# Patient Record
Sex: Female | Born: 1935 | Race: White | Hispanic: No | Marital: Married | Smoking: Former smoker
Health system: Southern US, Community
[De-identification: ages and names within clinical notes are randomized; demographics above are authoritative.]

## PROBLEM LIST (undated history)

## (undated) DIAGNOSIS — M549 Dorsalgia, unspecified: Secondary | ICD-10-CM

## (undated) DIAGNOSIS — L409 Psoriasis, unspecified: Secondary | ICD-10-CM

## (undated) DIAGNOSIS — K589 Irritable bowel syndrome without diarrhea: Secondary | ICD-10-CM

## (undated) DIAGNOSIS — M1990 Unspecified osteoarthritis, unspecified site: Secondary | ICD-10-CM

## (undated) DIAGNOSIS — K219 Gastro-esophageal reflux disease without esophagitis: Secondary | ICD-10-CM

## (undated) DIAGNOSIS — F419 Anxiety disorder, unspecified: Secondary | ICD-10-CM

## (undated) DIAGNOSIS — T07XXXA Unspecified multiple injuries, initial encounter: Secondary | ICD-10-CM

## (undated) DIAGNOSIS — K8020 Calculus of gallbladder without cholecystitis without obstruction: Secondary | ICD-10-CM

## (undated) DIAGNOSIS — C50919 Malignant neoplasm of unspecified site of unspecified female breast: Secondary | ICD-10-CM

## (undated) DIAGNOSIS — F1020 Alcohol dependence, uncomplicated: Secondary | ICD-10-CM

## (undated) DIAGNOSIS — G8929 Other chronic pain: Secondary | ICD-10-CM

## (undated) DIAGNOSIS — C3490 Malignant neoplasm of unspecified part of unspecified bronchus or lung: Secondary | ICD-10-CM

## (undated) DIAGNOSIS — D649 Anemia, unspecified: Secondary | ICD-10-CM

## (undated) DIAGNOSIS — J449 Chronic obstructive pulmonary disease, unspecified: Secondary | ICD-10-CM

## (undated) DIAGNOSIS — E43 Unspecified severe protein-calorie malnutrition: Secondary | ICD-10-CM

## (undated) DIAGNOSIS — G629 Polyneuropathy, unspecified: Secondary | ICD-10-CM

## (undated) HISTORY — DX: Gastro-esophageal reflux disease without esophagitis: K21.9

## (undated) HISTORY — DX: Polyneuropathy, unspecified: G62.9

## (undated) HISTORY — DX: Anxiety disorder, unspecified: F41.9

## (undated) HISTORY — PX: OTHER SURGICAL HISTORY: SHX169

## (undated) HISTORY — DX: Irritable bowel syndrome without diarrhea: K58.9

## (undated) HISTORY — PX: LUMBAR LAMINECTOMY: SHX95

## (undated) HISTORY — DX: Unspecified osteoarthritis, unspecified site: M19.90

## (undated) HISTORY — DX: Chronic obstructive pulmonary disease, unspecified: J44.9

## (undated) HISTORY — DX: Psoriasis, unspecified: L40.9

## (undated) HISTORY — DX: Malignant neoplasm of unspecified part of unspecified bronchus or lung: C34.90

## (undated) HISTORY — DX: Anemia, unspecified: D64.9

## (undated) HISTORY — DX: Calculus of gallbladder without cholecystitis without obstruction: K80.20

## (undated) HISTORY — DX: Malignant neoplasm of unspecified site of unspecified female breast: C50.919

## (undated) HISTORY — DX: Alcohol dependence, uncomplicated: F10.20

---

## 1985-12-08 HISTORY — PX: OTHER SURGICAL HISTORY: SHX169

## 1998-08-21 ENCOUNTER — Ambulatory Visit (HOSPITAL_COMMUNITY): Admission: RE | Admit: 1998-08-21 | Discharge: 1998-08-21 | Payer: Self-pay | Admitting: Pulmonary Disease

## 1998-08-28 ENCOUNTER — Ambulatory Visit (HOSPITAL_COMMUNITY): Admission: RE | Admit: 1998-08-28 | Discharge: 1998-08-28 | Payer: Self-pay | Admitting: Pulmonary Disease

## 1998-09-07 HISTORY — PX: OTHER SURGICAL HISTORY: SHX169

## 1998-09-20 ENCOUNTER — Inpatient Hospital Stay: Admission: RE | Admit: 1998-09-20 | Discharge: 1998-10-01 | Payer: Self-pay | Admitting: Thoracic Surgery

## 1998-09-20 ENCOUNTER — Encounter: Payer: Self-pay | Admitting: Thoracic Surgery

## 1998-09-21 ENCOUNTER — Encounter: Payer: Self-pay | Admitting: Thoracic Surgery

## 1998-09-24 ENCOUNTER — Encounter: Payer: Self-pay | Admitting: Thoracic Surgery

## 1998-09-25 ENCOUNTER — Encounter: Payer: Self-pay | Admitting: Thoracic Surgery

## 1998-09-26 ENCOUNTER — Encounter: Payer: Self-pay | Admitting: Thoracic Surgery

## 1998-09-27 ENCOUNTER — Encounter: Payer: Self-pay | Admitting: Thoracic Surgery

## 1998-09-28 ENCOUNTER — Encounter: Payer: Self-pay | Admitting: Thoracic Surgery

## 1998-09-30 ENCOUNTER — Encounter: Payer: Self-pay | Admitting: Thoracic Surgery

## 1998-10-14 ENCOUNTER — Inpatient Hospital Stay (HOSPITAL_COMMUNITY): Admission: EM | Admit: 1998-10-14 | Discharge: 1998-10-20 | Payer: Self-pay | Admitting: Emergency Medicine

## 1998-10-14 ENCOUNTER — Encounter: Payer: Self-pay | Admitting: Thoracic Surgery

## 1998-10-15 ENCOUNTER — Encounter: Payer: Self-pay | Admitting: Thoracic Surgery

## 1999-07-07 ENCOUNTER — Emergency Department (HOSPITAL_COMMUNITY): Admission: EM | Admit: 1999-07-07 | Discharge: 1999-07-07 | Payer: Self-pay | Admitting: Emergency Medicine

## 1999-10-30 ENCOUNTER — Encounter: Payer: Self-pay | Admitting: Thoracic Surgery

## 1999-10-30 ENCOUNTER — Encounter: Admission: RE | Admit: 1999-10-30 | Discharge: 1999-10-30 | Payer: Self-pay | Admitting: Thoracic Surgery

## 2000-04-27 ENCOUNTER — Encounter: Admission: RE | Admit: 2000-04-27 | Discharge: 2000-04-27 | Payer: Self-pay | Admitting: Thoracic Surgery

## 2000-04-27 ENCOUNTER — Encounter: Payer: Self-pay | Admitting: Thoracic Surgery

## 2000-11-04 ENCOUNTER — Encounter: Admission: RE | Admit: 2000-11-04 | Discharge: 2000-11-04 | Payer: Self-pay | Admitting: Thoracic Surgery

## 2000-11-04 ENCOUNTER — Encounter: Payer: Self-pay | Admitting: Thoracic Surgery

## 2001-05-04 ENCOUNTER — Encounter: Payer: Self-pay | Admitting: Thoracic Surgery

## 2001-05-04 ENCOUNTER — Encounter: Admission: RE | Admit: 2001-05-04 | Discharge: 2001-05-04 | Payer: Self-pay | Admitting: Thoracic Surgery

## 2001-11-03 ENCOUNTER — Encounter: Admission: RE | Admit: 2001-11-03 | Discharge: 2001-11-03 | Payer: Self-pay | Admitting: Thoracic Surgery

## 2001-11-03 ENCOUNTER — Encounter: Payer: Self-pay | Admitting: Thoracic Surgery

## 2002-05-17 ENCOUNTER — Encounter: Payer: Self-pay | Admitting: Thoracic Surgery

## 2002-05-17 ENCOUNTER — Encounter: Admission: RE | Admit: 2002-05-17 | Discharge: 2002-05-17 | Payer: Self-pay | Admitting: Thoracic Surgery

## 2002-10-18 ENCOUNTER — Other Ambulatory Visit: Admission: RE | Admit: 2002-10-18 | Discharge: 2002-10-18 | Payer: Self-pay | Admitting: Radiology

## 2002-11-15 ENCOUNTER — Encounter: Admission: RE | Admit: 2002-11-15 | Discharge: 2002-11-15 | Payer: Self-pay | Admitting: Surgery

## 2002-11-15 ENCOUNTER — Encounter: Payer: Self-pay | Admitting: Surgery

## 2002-11-16 ENCOUNTER — Encounter (INDEPENDENT_AMBULATORY_CARE_PROVIDER_SITE_OTHER): Payer: Self-pay | Admitting: *Deleted

## 2002-11-16 ENCOUNTER — Encounter: Payer: Self-pay | Admitting: Surgery

## 2002-11-16 ENCOUNTER — Ambulatory Visit (HOSPITAL_BASED_OUTPATIENT_CLINIC_OR_DEPARTMENT_OTHER): Admission: RE | Admit: 2002-11-16 | Discharge: 2002-11-16 | Payer: Self-pay | Admitting: Surgery

## 2002-11-23 ENCOUNTER — Ambulatory Visit: Admission: RE | Admit: 2002-11-23 | Discharge: 2003-02-21 | Payer: Self-pay | Admitting: Radiation Oncology

## 2002-12-23 ENCOUNTER — Encounter: Payer: Self-pay | Admitting: Thoracic Surgery

## 2002-12-23 ENCOUNTER — Encounter: Admission: RE | Admit: 2002-12-23 | Discharge: 2002-12-23 | Payer: Self-pay | Admitting: Thoracic Surgery

## 2003-06-27 ENCOUNTER — Encounter: Payer: Self-pay | Admitting: Thoracic Surgery

## 2003-06-27 ENCOUNTER — Encounter: Admission: RE | Admit: 2003-06-27 | Discharge: 2003-06-27 | Payer: Self-pay | Admitting: Thoracic Surgery

## 2003-11-10 ENCOUNTER — Other Ambulatory Visit: Admission: RE | Admit: 2003-11-10 | Discharge: 2003-11-10 | Payer: Self-pay | Admitting: Internal Medicine

## 2004-01-16 ENCOUNTER — Encounter: Admission: RE | Admit: 2004-01-16 | Discharge: 2004-01-16 | Payer: Self-pay | Admitting: Thoracic Surgery

## 2004-04-07 HISTORY — PX: OTHER SURGICAL HISTORY: SHX169

## 2004-05-06 ENCOUNTER — Inpatient Hospital Stay (HOSPITAL_COMMUNITY): Admission: EM | Admit: 2004-05-06 | Discharge: 2004-05-10 | Payer: Self-pay | Admitting: *Deleted

## 2004-05-10 ENCOUNTER — Inpatient Hospital Stay (HOSPITAL_COMMUNITY)
Admission: RE | Admit: 2004-05-10 | Discharge: 2004-05-28 | Payer: Self-pay | Admitting: Physical Medicine & Rehabilitation

## 2004-05-15 ENCOUNTER — Ambulatory Visit (HOSPITAL_COMMUNITY): Admission: RE | Admit: 2004-05-15 | Discharge: 2004-05-15 | Payer: Self-pay | Admitting: Thoracic Surgery

## 2004-05-20 ENCOUNTER — Encounter (INDEPENDENT_AMBULATORY_CARE_PROVIDER_SITE_OTHER): Payer: Self-pay | Admitting: Specialist

## 2004-06-19 ENCOUNTER — Encounter: Admission: RE | Admit: 2004-06-19 | Discharge: 2004-06-19 | Payer: Self-pay | Admitting: Thoracic Surgery

## 2004-07-31 ENCOUNTER — Encounter: Admission: RE | Admit: 2004-07-31 | Discharge: 2004-07-31 | Payer: Self-pay | Admitting: Thoracic Surgery

## 2004-12-11 ENCOUNTER — Encounter: Admission: RE | Admit: 2004-12-11 | Discharge: 2004-12-11 | Payer: Self-pay | Admitting: Thoracic Surgery

## 2005-01-07 ENCOUNTER — Ambulatory Visit: Payer: Self-pay | Admitting: Oncology

## 2005-02-11 ENCOUNTER — Ambulatory Visit: Payer: Self-pay | Admitting: Pulmonary Disease

## 2005-04-08 ENCOUNTER — Ambulatory Visit: Payer: Self-pay | Admitting: Internal Medicine

## 2005-05-21 ENCOUNTER — Encounter: Admission: RE | Admit: 2005-05-21 | Discharge: 2005-05-21 | Payer: Self-pay | Admitting: Thoracic Surgery

## 2005-07-01 ENCOUNTER — Ambulatory Visit: Payer: Self-pay | Admitting: Pulmonary Disease

## 2005-07-10 ENCOUNTER — Ambulatory Visit: Payer: Self-pay | Admitting: Pulmonary Disease

## 2005-09-19 ENCOUNTER — Ambulatory Visit: Payer: Self-pay | Admitting: Pulmonary Disease

## 2005-11-25 ENCOUNTER — Encounter: Admission: RE | Admit: 2005-11-25 | Discharge: 2005-11-25 | Payer: Self-pay | Admitting: Thoracic Surgery

## 2005-12-09 ENCOUNTER — Ambulatory Visit: Payer: Self-pay | Admitting: Adult Health

## 2006-01-13 ENCOUNTER — Ambulatory Visit: Payer: Self-pay | Admitting: Pulmonary Disease

## 2006-01-16 ENCOUNTER — Ambulatory Visit: Payer: Self-pay | Admitting: Oncology

## 2006-04-08 ENCOUNTER — Encounter: Payer: Self-pay | Admitting: Pulmonary Disease

## 2006-07-14 ENCOUNTER — Ambulatory Visit: Payer: Self-pay | Admitting: Pulmonary Disease

## 2006-09-02 ENCOUNTER — Encounter: Admission: RE | Admit: 2006-09-02 | Discharge: 2006-09-02 | Payer: Self-pay | Admitting: Thoracic Surgery

## 2006-09-12 ENCOUNTER — Emergency Department (HOSPITAL_COMMUNITY): Admission: EM | Admit: 2006-09-12 | Discharge: 2006-09-12 | Payer: Self-pay | Admitting: Emergency Medicine

## 2006-09-22 ENCOUNTER — Ambulatory Visit: Payer: Self-pay | Admitting: Pulmonary Disease

## 2007-01-11 ENCOUNTER — Ambulatory Visit: Payer: Self-pay | Admitting: Pulmonary Disease

## 2007-01-11 LAB — CONVERTED CEMR LAB
ALT: 11 units/L (ref 0–40)
Alkaline Phosphatase: 63 units/L (ref 39–117)
Basophils Absolute: 0.1 10*3/uL (ref 0.0–0.1)
Basophils Relative: 1 % (ref 0.0–1.0)
Calcium: 8.6 mg/dL (ref 8.4–10.5)
Chloride: 108 meq/L (ref 96–112)
Creatinine, Ser: 1.1 mg/dL (ref 0.4–1.2)
Eosinophils Absolute: 0.1 10*3/uL (ref 0.0–0.6)
Eosinophils Relative: 1.8 % (ref 0.0–5.0)
GFR calc non Af Amer: 51 mL/min
Lymphocytes Relative: 44.8 % (ref 12.0–46.0)
MCV: 91.6 fL (ref 78.0–100.0)
Platelets: 292 10*3/uL (ref 150–400)
Potassium: 4.4 meq/L (ref 3.5–5.1)
RBC: 3.82 M/uL — ABNORMAL LOW (ref 3.87–5.11)
TSH: 2.51 microintl units/mL (ref 0.35–5.50)
Total Bilirubin: 0.2 mg/dL — ABNORMAL LOW (ref 0.3–1.2)

## 2007-01-13 ENCOUNTER — Ambulatory Visit: Payer: Self-pay | Admitting: Oncology

## 2007-07-12 ENCOUNTER — Ambulatory Visit: Payer: Self-pay | Admitting: Pulmonary Disease

## 2007-08-02 ENCOUNTER — Ambulatory Visit: Payer: Self-pay | Admitting: Internal Medicine

## 2007-08-02 ENCOUNTER — Encounter: Payer: Self-pay | Admitting: Pulmonary Disease

## 2007-09-23 DIAGNOSIS — C3490 Malignant neoplasm of unspecified part of unspecified bronchus or lung: Secondary | ICD-10-CM | POA: Insufficient documentation

## 2007-09-23 DIAGNOSIS — K589 Irritable bowel syndrome without diarrhea: Secondary | ICD-10-CM

## 2007-09-23 DIAGNOSIS — K219 Gastro-esophageal reflux disease without esophagitis: Secondary | ICD-10-CM

## 2007-09-23 DIAGNOSIS — L408 Other psoriasis: Secondary | ICD-10-CM | POA: Insufficient documentation

## 2007-09-23 DIAGNOSIS — D649 Anemia, unspecified: Secondary | ICD-10-CM | POA: Insufficient documentation

## 2007-09-23 DIAGNOSIS — F411 Generalized anxiety disorder: Secondary | ICD-10-CM | POA: Insufficient documentation

## 2007-10-05 ENCOUNTER — Ambulatory Visit: Payer: Self-pay | Admitting: Pulmonary Disease

## 2007-10-26 ENCOUNTER — Encounter (INDEPENDENT_AMBULATORY_CARE_PROVIDER_SITE_OTHER): Payer: Self-pay

## 2007-11-16 ENCOUNTER — Ambulatory Visit: Payer: Self-pay | Admitting: Oncology

## 2007-11-18 LAB — CBC WITH DIFFERENTIAL/PLATELET
BASO%: 1.7 % (ref 0.0–2.0)
HCT: 37.4 % (ref 34.8–46.6)
HGB: 12.5 g/dL (ref 11.6–15.9)
MCHC: 33.4 g/dL (ref 32.0–36.0)
MONO#: 0.5 10*3/uL (ref 0.1–0.9)
NEUT%: 36.2 % — ABNORMAL LOW (ref 39.6–76.8)
RDW: 14.6 % — ABNORMAL HIGH (ref 11.3–14.5)
WBC: 9.2 10*3/uL (ref 3.9–10.0)
lymph#: 5.1 10*3/uL — ABNORMAL HIGH (ref 0.9–3.3)

## 2007-11-18 LAB — COMPREHENSIVE METABOLIC PANEL
ALT: 10 U/L (ref 0–35)
Albumin: 3.8 g/dL (ref 3.5–5.2)
CO2: 26 mEq/L (ref 19–32)
Calcium: 9.3 mg/dL (ref 8.4–10.5)
Chloride: 103 mEq/L (ref 96–112)
Creatinine, Ser: 1.06 mg/dL (ref 0.40–1.20)
Potassium: 4.3 mEq/L (ref 3.5–5.3)
Total Protein: 6.3 g/dL (ref 6.0–8.3)

## 2007-11-18 LAB — CANCER ANTIGEN 27.29: CA 27.29: 45 U/mL — ABNORMAL HIGH (ref 0–39)

## 2007-11-25 ENCOUNTER — Encounter: Payer: Self-pay | Admitting: Pulmonary Disease

## 2008-02-09 ENCOUNTER — Ambulatory Visit: Payer: Self-pay | Admitting: Pulmonary Disease

## 2008-02-09 DIAGNOSIS — J449 Chronic obstructive pulmonary disease, unspecified: Secondary | ICD-10-CM

## 2008-02-09 DIAGNOSIS — M1990 Unspecified osteoarthritis, unspecified site: Secondary | ICD-10-CM | POA: Insufficient documentation

## 2008-02-09 DIAGNOSIS — C50919 Malignant neoplasm of unspecified site of unspecified female breast: Secondary | ICD-10-CM | POA: Insufficient documentation

## 2008-02-09 DIAGNOSIS — J4489 Other specified chronic obstructive pulmonary disease: Secondary | ICD-10-CM | POA: Insufficient documentation

## 2008-02-09 DIAGNOSIS — K8020 Calculus of gallbladder without cholecystitis without obstruction: Secondary | ICD-10-CM | POA: Insufficient documentation

## 2008-02-09 DIAGNOSIS — G589 Mononeuropathy, unspecified: Secondary | ICD-10-CM | POA: Insufficient documentation

## 2008-02-09 DIAGNOSIS — M810 Age-related osteoporosis without current pathological fracture: Secondary | ICD-10-CM | POA: Insufficient documentation

## 2008-02-09 DIAGNOSIS — F1020 Alcohol dependence, uncomplicated: Secondary | ICD-10-CM | POA: Insufficient documentation

## 2008-02-13 LAB — CONVERTED CEMR LAB
AST: 29 units/L (ref 0–37)
Alkaline Phosphatase: 84 units/L (ref 39–117)
Bilirubin, Direct: 0.1 mg/dL (ref 0.0–0.3)
Chloride: 103 meq/L (ref 96–112)
Eosinophils Absolute: 0.1 10*3/uL (ref 0.0–0.6)
GFR calc Af Amer: 78 mL/min
GFR calc non Af Amer: 65 mL/min
HCT: 36.4 % (ref 36.0–46.0)
Lymphocytes Relative: 26.8 % (ref 12.0–46.0)
Monocytes Relative: 2.6 % — ABNORMAL LOW (ref 3.0–11.0)
Neutrophils Relative %: 69.6 % (ref 43.0–77.0)
RBC: 3.98 M/uL (ref 3.87–5.11)
Saturation Ratios: 19.4 % — ABNORMAL LOW (ref 20.0–50.0)
TSH: 2.56 microintl units/mL (ref 0.35–5.50)
Total Bilirubin: 0.5 mg/dL (ref 0.3–1.2)
Vit D, 1,25-Dihydroxy: 30 (ref 30–89)
WBC: 8 10*3/uL (ref 4.5–10.5)

## 2008-04-07 ENCOUNTER — Telehealth: Payer: Self-pay | Admitting: Pulmonary Disease

## 2008-04-13 ENCOUNTER — Encounter: Payer: Self-pay | Admitting: Pulmonary Disease

## 2008-04-26 ENCOUNTER — Encounter: Payer: Self-pay | Admitting: Pulmonary Disease

## 2008-08-11 ENCOUNTER — Ambulatory Visit: Payer: Self-pay | Admitting: Pulmonary Disease

## 2008-08-14 LAB — CONVERTED CEMR LAB
Basophils Absolute: 0.1 10*3/uL (ref 0.0–0.1)
Basophils Relative: 0.7 % (ref 0.0–3.0)
CO2: 33 meq/L — ABNORMAL HIGH (ref 19–32)
Chloride: 102 meq/L (ref 96–112)
Creatinine, Ser: 0.8 mg/dL (ref 0.4–1.2)
Eosinophils Relative: 1.2 % (ref 0.0–5.0)
GFR calc Af Amer: 90 mL/min
Glucose, Bld: 97 mg/dL (ref 70–99)
HCT: 38.3 % (ref 36.0–46.0)
Hemoglobin: 13.3 g/dL (ref 12.0–15.0)
MCHC: 34.6 g/dL (ref 30.0–36.0)
MCV: 91.9 fL (ref 78.0–100.0)
Monocytes Absolute: 0.7 10*3/uL (ref 0.1–1.0)
Neutrophils Relative %: 57.2 % (ref 43.0–77.0)
Platelets: 397 10*3/uL (ref 150–400)
RBC: 4.16 M/uL (ref 3.87–5.11)
Saturation Ratios: 24.2 % (ref 20.0–50.0)

## 2008-08-18 ENCOUNTER — Encounter: Payer: Self-pay | Admitting: Pulmonary Disease

## 2008-08-23 LAB — CONVERTED CEMR LAB: Vit D, 1,25-Dihydroxy: 66 (ref 30–89)

## 2008-09-06 ENCOUNTER — Ambulatory Visit: Payer: Self-pay | Admitting: Pulmonary Disease

## 2009-02-16 ENCOUNTER — Ambulatory Visit: Payer: Self-pay | Admitting: Pulmonary Disease

## 2009-03-12 ENCOUNTER — Ambulatory Visit: Payer: Self-pay | Admitting: Pulmonary Disease

## 2009-04-06 ENCOUNTER — Encounter: Payer: Self-pay | Admitting: Pulmonary Disease

## 2009-04-23 ENCOUNTER — Encounter: Payer: Self-pay | Admitting: Pulmonary Disease

## 2009-07-30 ENCOUNTER — Telehealth: Payer: Self-pay | Admitting: Pulmonary Disease

## 2009-08-22 ENCOUNTER — Ambulatory Visit: Payer: Self-pay | Admitting: Pulmonary Disease

## 2009-08-24 LAB — CONVERTED CEMR LAB
Basophils Absolute: 0 10*3/uL (ref 0.0–0.1)
Bilirubin, Direct: 0 mg/dL (ref 0.0–0.3)
CO2: 32 meq/L (ref 19–32)
Creatinine, Ser: 1 mg/dL (ref 0.4–1.2)
GFR calc non Af Amer: 57.04 mL/min (ref >60)
Lymphs Abs: 2.9 10*3/uL (ref 0.7–4.0)
MCV: 92.4 fL (ref 78.0–100.0)
Monocytes Absolute: 0.8 10*3/uL (ref 0.1–1.0)
Neutro Abs: 6 10*3/uL (ref 1.4–7.7)
Neutrophils Relative %: 60.7 % (ref 43.0–77.0)
Potassium: 4.5 meq/L (ref 3.5–5.1)
RBC: 4.25 M/uL (ref 3.87–5.11)
Saturation Ratios: 32.6 % (ref 20.0–50.0)
Sodium: 141 meq/L (ref 135–145)
Total Bilirubin: 0.5 mg/dL (ref 0.3–1.2)
WBC: 9.9 10*3/uL (ref 4.5–10.5)

## 2009-08-27 ENCOUNTER — Telehealth: Payer: Self-pay | Admitting: Pulmonary Disease

## 2009-09-07 ENCOUNTER — Ambulatory Visit: Payer: Self-pay | Admitting: Pulmonary Disease

## 2009-09-07 ENCOUNTER — Ambulatory Visit: Payer: Self-pay | Admitting: Family Medicine

## 2009-11-20 ENCOUNTER — Telehealth (INDEPENDENT_AMBULATORY_CARE_PROVIDER_SITE_OTHER): Payer: Self-pay | Admitting: *Deleted

## 2010-02-20 ENCOUNTER — Ambulatory Visit: Payer: Self-pay | Admitting: Pulmonary Disease

## 2010-02-20 DIAGNOSIS — H9190 Unspecified hearing loss, unspecified ear: Secondary | ICD-10-CM | POA: Insufficient documentation

## 2010-07-12 ENCOUNTER — Ambulatory Visit: Payer: Self-pay | Admitting: Pulmonary Disease

## 2010-07-12 DIAGNOSIS — R634 Abnormal weight loss: Secondary | ICD-10-CM

## 2010-07-15 LAB — CONVERTED CEMR LAB
AST: 24 units/L (ref 0–37)
Alkaline Phosphatase: 87 units/L (ref 39–117)
Bilirubin, Direct: 0.1 mg/dL (ref 0.0–0.3)
Calcium: 9.5 mg/dL (ref 8.4–10.5)
Creatinine, Ser: 0.7 mg/dL (ref 0.4–1.2)
Eosinophils Relative: 1.3 % (ref 0.0–5.0)
Folate: 19.9 ng/mL
Glucose, Bld: 79 mg/dL (ref 70–99)
Iron: 94 ug/dL (ref 42–145)
Lymphocytes Relative: 33.2 % (ref 12.0–46.0)
MCHC: 33 g/dL (ref 30.0–36.0)
MCV: 91.8 fL (ref 78.0–100.0)
Neutro Abs: 5.9 10*3/uL (ref 1.4–7.7)
Neutrophils Relative %: 58.9 % (ref 43.0–77.0)
Platelets: 283 10*3/uL (ref 150.0–400.0)
Potassium: 4.4 meq/L (ref 3.5–5.1)
RBC: 4.25 M/uL (ref 3.87–5.11)
RDW: 16.3 % — ABNORMAL HIGH (ref 11.5–14.6)
TSH: 2.71 microintl units/mL (ref 0.35–5.50)
WBC: 10 10*3/uL (ref 4.5–10.5)

## 2010-08-23 ENCOUNTER — Ambulatory Visit: Payer: Self-pay | Admitting: Pulmonary Disease

## 2010-12-29 ENCOUNTER — Encounter: Payer: Self-pay | Admitting: Thoracic Surgery

## 2011-01-07 NOTE — Assessment & Plan Note (Signed)
Summary: losing weight/ no appetite/ mbw   CC:  c/o losing weight and no appetite sob occasionally with exertion.  History of Present Illness: 77 y/o WF w/ known hx of COPD, prev. lung cancer s/p LLLobectomy for squamous cell ca 10/99 by DrBurney..., breast cancer s/p left mastectomy. (finished tamoxifen x 5 yr-12/08)   ~9/09-- 6 month follow up visit... she has mult medical problems as listed... she has been stable over the last 6 months- confirmed by her daughter... she notes incr SOB recently which she relates to the weather, but on further questioning it is more likely to be secondary to her being in the "donut hole" and she hasn't filled her Advair, and decr her Albut Neb to Prn only...   February 16, 2009 --Presents w/ her daughter. Has noticed lumb along scar on left lateral side scar. Pt c/o she is worried about a lump along post/lateral-.left that she feels when she runs her hand along this area. No weight loss, daughter says this has been there for since her surgery, it is skin, she has not noticed any nodules. . Breathing has been stable. Last mammogram 5/09 neg.     ~  Sep10:  she has been stable (confirmed by her daughter), no new complaints or concerns... she is due for f/u CXR (stable- NAD,no recurrence), lab work (all OK), and BMD (severe osteoporosis but sl improved)...   ~  February 20, 2010:  she remains stable w/o new complaints or concerns, requesting refill of all meds for 2011... prev BMD showed sl improvement in TScores on the Boniva, Calcium, Vit D...  July 12, 2010 --Presents for follow up to discuss her weight. Her weight has trended down 4 lbs over last 6 months -she is now down to 78lbs. Family is concerned. SHe says she does eat 3 meals a day. We talked about her teeth-she is  edentulous - her dentures do not work-her gums have shrunk and she can not wear them any longer. She eats softer foods now. Denies chest pain, dyspnea, orthopnea, hemoptysis, fever, n/v/d, edema,  headache,bloody stools, abdominal pain, night sweats.     Preventive Screening-Counseling & Management  Alcohol-Tobacco     Smoking Status: quit     Year Quit: 2000  Current Medications (verified): 1)  Cvs Loratadine 10 Mg Tabs (Loratadine) .... As Needed 2)  Albuterol Sulfate (2.5 Mg/15ml) 0.083%  Nebu (Albuterol Sulfate) .Marland Kitchen.. 1 Vial Via Nebulizer Up To 4 Times A Day As Needed For Wheezing... 3)  Advair Diskus 250-50 Mcg/dose  Misc (Fluticasone-Salmeterol) .Marland Kitchen.. 1 Inhalation Two Times A Day 4)  Prevacid 24hr 15 Mg Cpdr (Lansoprazole) .... Take 1 Cap By Mouth Once Daily.Marland KitchenMarland Kitchen 5)  Bentyl 20 Mg Tabs (Dicyclomine Hcl) .... Take One Tablet By Mouth Three Times A Day As Needed For Abd Cramping 6)  Boniva 150 Mg  Tabs (Ibandronate Sodium) .Marland Kitchen.. 1 By Mouth Every Month 7)  Caltrate 600+d Plus 600-400 Mg-Unit Tabs (Calcium Carbonate-Vit D-Min) .... Take 1 Tab By Mouth Two Times A Day... 8)  Vitamin D3 2000 Unit Caps (Cholecalciferol) .... Take 1 Cap By Mouth Once Daily.Marland KitchenMarland Kitchen 9)  Tramadol Hcl 50 Mg Tabs (Tramadol Hcl) .... Take 1 Tablet By Mouth Three Times A Day As Needed For Pain 10)  Neurontin 300 Mg  Caps (Gabapentin) .Marland Kitchen.. 1 By Mouth Three Times A Day 11)  Chlordiazepoxide Hcl 10 Mg Caps (Chlordiazepoxide Hcl) .... Take 1 Capsule By Mouth Three Times A Day As Needed For Nerves... 12)  Zoloft 100  Mg  Tabs (Sertraline Hcl) .Marland Kitchen.. 1 By Mouth Once Daily 13)  Remeron 15 Mg  Tabs (Mirtazapine) .Marland Kitchen.. 1 By Mouth Once Daily 14)  Triamcinolone Cream .... 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 S/P left lung surgery w/ LLLobectomy for squamous cell cancer 10/99 by DrBurney S/P left breast lumpectomy w/ sentinel lymph node biopsy  S/P left femur fracture repaired by DrDuda 5/05  Review of Systems      See HPI  Vital Signs:  Patient profile:   77 year old female Height:      60 inches Weight:      78.13 pounds BMI:     15.31 O2 Sat:      95 % on Room air Temp:     97.0 degrees F oral Pulse rate:   87 / minute BP sitting:   140 / 70  (right arm) Cuff size:   regular  Vitals Entered By: Kandice Hams CMA (July 12, 2010 11:54 AM)  O2 Flow:  Room air CC: c/o losing weight, no appetite sob occasionally with exertion   Physical Exam  Additional Exam:  WD, Thin, 77y/o WF in NAD... she is chr ill apearing... GENERAL:  Alert & oriented; pleasant & cooperative... HEENT:  [place]/AT,  ,  NECK:  Supple w/ full ROM; no JVD; normal carotid impulses w/o bruits; no thyromegaly or nodules palpated; no lymphadenopathy. CHEST:  Clear to P & A; prev left thoracotomy scar; without wheezes/ rales/ or rhonchi heard... left chest s/p mastectomy w/ well healed scar HEART:  Regular Rhythm; without murmurs/ rubs/ or gallops detected... ABDOMEN:  Soft & nontender; normal bowel sounds; no organomegaly or masses palpated... EXT: without deformities, mild arthritic changes; no varicose veins/ venous insuffic/ or edema.     Impression & Recommendations:  Problem # 1:  WEIGHT LOSS (ICD-783.21) Her weight has trended down over last year. We discussed several options  for now will check labs and xray .  she will add ensure three times a day b/t meals to see if this helps with calorie load.  return in 1 month if not improving will consider megace.   Orders: TLB-TSH (Thyroid Stimulating Hormone)  (84443-TSH) Est. Patient Level IV (04540)  Problem # 2:  COPD (ICD-496) compensated.   Problem # 3:  Hx of CARCINOMA, LUNG, SQUAMOUS CELL (ICD-162.9) yearly xray pending.   Medications Added to Medication List This Visit: 1)  Triamcinolone Cream  .... Apply to affected area as needed  Complete Medication List: 1)  Cvs Loratadine 10 Mg Tabs (Loratadine) .... As needed 2)  Albuterol Sulfate (2.5 Mg/32ml) 0.083% Nebu (Albuterol sulfate) .Marland Kitchen.. 1 vial via nebulizer up to 4 times a day as needed for wheezing... 3)  Advair Diskus 250-50 Mcg/dose Misc (Fluticasone-salmeterol) .Marland Kitchen.. 1 inhalation two times a day 4)  Prevacid 24hr 15 Mg Cpdr (Lansoprazole) .... Take 1 cap by mouth once daily.Marland KitchenMarland Kitchen 5)  Bentyl 20 Mg Tabs (Dicyclomine hcl) .... Take one tablet by mouth three times a day as needed for abd cramping 6)  Boniva 150 Mg Tabs (Ibandronate sodium) .Marland Kitchen.. 1 by mouth every month 7)  Caltrate 600+d Plus 600-400 Mg-unit Tabs (Calcium carbonate-vit d-min) .... Take 1 tab by mouth two times a day... 8)  Vitamin D3 2000 Unit Caps (Cholecalciferol) .... Take 1 cap by mouth once daily.Marland KitchenMarland Kitchen 9)  Tramadol Hcl 50 Mg Tabs (Tramadol hcl) .... Take 1 tablet by mouth three times a day as needed for pain 10)  Neurontin 300 Mg Caps (Gabapentin) .Marland Kitchen.. 1 by mouth three times a day 11)  Chlordiazepoxide Hcl 10 Mg Caps (Chlordiazepoxide hcl) .... Take 1 capsule by mouth three times a day as needed for nerves... 12)  Zoloft 100 Mg Tabs (Sertraline hcl) .Marland Kitchen.. 1 by mouth once daily 13)  Remeron 15 Mg Tabs (Mirtazapine) .Marland Kitchen.. 1 by mouth once daily 14)  Triamcinolone Cream  .... Apply to affected area as needed  Other Orders: TLB-CBC Platelet - w/Differential (85025-CBCD) TLB-BMP (Basic Metabolic Panel-BMET) (80048-METABOL) TLB-Hepatic/Liver Function Pnl (80076-HEPATIC) TLB-B12 + Folate Pnl (16109_60454-U98/JXB) TLB-IBC Pnl (Iron/FE;Transferrin) (83550-IBC) T-2 View CXR (71020TC)  Patient Instructions: 1)  Drink Boost or  ensure  between meals three times a day  2)  I will check labs and call results to you next week.  3)  Do not skip meals.  4)  Avoid extreme heat /cold  5)  follow up 1 month Dr. [name]  6)  Please contact office for sooner follow up if symptoms do not improve or worsen

## 2011-01-07 NOTE — Assessment & Plan Note (Signed)
Summary: 88m reck cxr and blood work/klw   CC:  6 month ROV & review of mult medical problems....  History of Present Illness: 77 y/o WF here for a 6 month follow up visit... she has mult medical problems as listed...    ~  Sep10:  she has been stable (confirmed by her daughter), no new complaints or concerns... she is due for f/u CXR (stable- NAD,no recurrence), lab work (all OK), and BMD (severe osteoporosis but sl improved)...   ~  February 20, 2010:  she remains stable w/o new complaints or concerns, requesting refill of all meds for 2011... prev BMD showed sl improvement in TScores on the Boniva, Calcium, Vit D...   ~  August 23, 2010:  she saw TP 8/11 w/ c/o weight loss down to 78# & daugh wonders if it might not have been from depression- better now, appetite improved, & wt up 3# to 81# on Ensure supplements ("I just wasn't eating" she says)... offered Megace but she declines stating her appetite is OK & she will continue the Ensure + rec for Women's MVI, Vit B12 & Vit D 1000u daily... CXR 8/11- chr changes, NAD & labs all looked reasonable> reviewed w/ pt & daughter...    Current Problem List:  HEARING LOSS (ICD-389.9) - she refuses hearing eval...  COPD (ICD-496) - ex-smoker... on ALBUT NEBS up to Qid, ADVAIR 250Bid, & Prn Mucinex...  ~  CTChest 6/06 w/ stable post-op changes on left, no recurrence, scarring RLL, sm gallstones...  ~  CXR 8/08 w/ vol loss & post-op changes on left, right clear...   ~  CXR 9/09 showed stable post op changes on the left, NAD.Marland Kitchen.  ~  CXR 9/10 unchanged- stable post op appearance of left chest, NAD.Marland Kitchen.  ~  CXR 8/11 showed post op changes & scarring, COPD, NAD...  Hx of CARCINOMA, LUNG, SQUAMOUS CELL (ICD-162.9) - s/p left thoracotomy w/ LLLobectomy for squamous cell ca 10/99 by DrBurney... no known recurrence.  GERD (ICD-530.81) - on PREVACID 15mg /d OTC... last EGD was 11/99 showing GERD, otherw neg... she had a left thoracotomy w/ resection of  a granular cell myoblastoma from the distal esoph in 1987 by DrMarsicano...  ** note: she states the Prevacid really helps and the generic Omeprazole didn't help...  IBS (ICD-564.1) - on BENTYL 20mg  prn...last colonoscopy was 7/03 by DrPerry & was normal- no pathologic findings...  GALLSTONES (ICD-574.20)  Hx of ALCOHOLISM (ICD-303.90)  Hx of ADENOCARCINOMA, BREAST (ICD-174.9) - DrMagrinat stopped her Tamoxifen after 68yrs and released her on 12/08... she had a left breast lumpectomy and sentinel node biopsy 12/03 by [facility] for a 2.2cm infiltrating carcinoma, neg LN's, ER/PR pos, treated w/ XRT, then Tamoxifen for 68yrs...  ~  Mammogram 5/09 was negative...  ~  Mammogram 5/10 at [facility] was neg- fatty replacement, post-op changes...  DEGENERATIVE JOINT DISEASE (ICD-715.90) - she's had a prev lumbar laminectomy & a fractured left hip after a fall (repaired by DrDuda [id number])...  OSTEOPOROSIS (ICD-733.00) - she is on BONIVA 150mg /month along w/ calcium and vitD supplements...  ~  BMD 8/08 shows severe osteoporosis w/ TScores -3.5 in the spine and -3.7 in the hip...  improved from 5/06 study!  ~  Vit D level 3/09 = 30 & 50K/wk Rx started...   ~  Vit D level 9/09 = 66 & switched to 1000u OTC daily...  ~  BMD 9/10 showed TScores -3.3 spine, and -3.1 in right fem neck... sl improved from 2008, continue Rx,  avoid trauma.                        NEUROPATHY (ICD-355.9) - on NEURONTIN 300mg  Tid... it really helps her back pain.  ANXIETY (ICD-300.00) - on LIBRIUM 10mg tid, ZOLOFT 100mg /d and REMERON 15mg Qhs... she wishes to continue all of these the same.  PSORIASIS (ICD-696.1) -  treated by Elnora Morrison w/ MTX- intol pills, now on shots...  ANEMIA (ICD-285.9) - she is rec to take Women's MVI, Vit B12 1071mcg/d, Vit D 1000 u/d...  ~  labs 3/09 showed Hg= 11.9 w/ Fe= 67... started Feosol 200mg /d...  ~  labs 9/09 showed Hg= 13.3 w/ Fe= 84  ~  labs 9/10 showed Hg= 13.3, Fe= 111... pt stopped the Fe  supplement.  ~  labs 8/11 showed Hg= 12.9, Fe= 94 (23%sat), B12= 302   Preventive Screening-Counseling & Management  Alcohol-Tobacco     Smoking Status: quit     Year Quit: 1999  Allergies (verified): No Known Drug Allergies  Comments:  Nurse/Medical Assistant: The patient's medications and allergies were reviewed with the patient and were updated in the Medication and Allergy Lists.  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 mother deceased age 39 from sepsis father deceased age 57 from auto accident 1 sibling deceased age 30 from cancer--breast and lung  Social History: Reviewed history from 08/11/2008 and no changes required. quit smoking in 1999--smoked for 40 years no exercise caffeine use:  2 cups per week quit drinking in 1987 widowed 4 children  Review of Systems      See HPI       The patient complains of weight loss, dyspnea on exertion, muscle weakness, difficulty walking, and depression.  The patient denies anorexia, fever, weight gain, vision loss, decreased hearing, hoarseness, chest pain, syncope, peripheral edema, prolonged cough, headaches, hemoptysis, abdominal pain, melena, hematochezia, severe  indigestion/heartburn, hematuria, incontinence, suspicious skin lesions, transient blindness, unusual weight change, abnormal bleeding, enlarged lymph nodes, and angioedema.    Vital Signs:  Patient profile:   77 year old female Height:      60 inches Weight:      8.50 pounds BMI:     1.67 O2 Sat:      97 % on Room air Temp:     97.4 degrees F oral Pulse rate:   62 / minute BP sitting:   122 / 82  (left arm) Cuff size:   small  Vitals Entered By: Randell Loop CMA (August 23, 2010 11:32 AM)  O2 Sat at Rest %:  97 O2 Flow:  Room air CC: 6 month ROV & review of mult medical problems... Is Patient Diabetic? No Pain Assessment Patient in pain? no      Comments meds updated today with pt--pt brought all meds today   Physical Exam  Additional Exam:  WD, Thin, 77 y/o WF in NAD... she is chr ill apearing.Marland KitchenMarland Kitchen  GENERAL:  Alert & oriented; pleasant & cooperative... HEENT:  [place]/AT, EOM-wnl, PERRLA, EACs-clear, TMs-wnl, NOSE-clear, THROAT-clear & wnl. NECK:  Supple w/ fairROM; no JVD; normal carotid impulses w/o bruits; no thyromegaly or nodules palpated; no lymphadenopathy. CHEST:  Clear to P & A; prev left thoracotomy scar; without wheezes/ rales/ or rhonchi heard... HEART:  Regular Rhythm; without murmurs/ rubs/ or gallops detected... ABDOMEN:  Soft & nontender; normal bowel sounds; no organomegaly or masses palpated... EXT: without deformities, mild arthritic changes; no varicose veins/ venous insuffic/ or edema. NEURO:  CN's intact; no focal neuro deficits x mild neuropathy... DERM:  No lesions noted; no rash etc...    CXR  Procedure date:  07/12/2010  Findings:      CHEST - 2 VIEW Comparison: 08/22/2009   Findings: Postoperative changes and areas of scarring noted in the left lung.  Right lung remains clear. Stable hyperinflation of the right lung.  Mediastinal structures are shifted to the left.  Heart is normal size.  No acute bony abnormality.   IMPRESSION: Stable  postoperative changes with scarring in the left lung. No active disease. COPD.   Read By:  Charlett Nose,  M.D.   MISC. Report  Procedure date:  07/12/2010  Findings:      CBC Platelet w/Diff (CBCD)   White Cell Count          10.0 K/uL                   4.5-10.5   Red Cell Count            4.25 Mil/uL                 3.87-5.11   Hemoglobin                12.9 g/dL                   81.1-91.4   Hematocrit                39.0 %                      36.0-46.0   MCV                       91.8 fl                     78.0-100.0   Platelet Count            283.0 K/uL                  150.0-400.0   Neutrophil %              58.9 %                      43.0-77.0   Lymphocyte %              33.2 %                      12.0-46.0   Monocyte %                6.2 %                       3.0-12.0   Eosinophils%              1.3 %  0.0-5.0   Basophils %               0.4 %                       0.0-3.0  BMP (METABOL)   Sodium                    140 mEq/L                   135-145   Potassium                 4.4 mEq/L                   3.5-5.1   Chloride                  99 mEq/L                    96-112   Carbon Dioxide            31 mEq/L                    19-32   Glucose                   79 mg/dL                    16-10   BUN                       19 mg/dL                    9-60   Creatinine                0.7 mg/dL                   4.5-4.0   Calcium                   9.5 mg/dL                   9.8-11.9   GFR                       88.80 mL/min                >60  Hepatic/Liver Function Panel (HEPATIC)   Total Bilirubin           0.3 mg/dL                   1.4-7.8   Direct Bilirubin          0.1 mg/dL                   2.9-5.6   Alkaline Phosphatase      87 U/L                      39-117   AST                       24 U/L                      0-37   ALT  16 U/L                      0-35   Total Protein             7.0 g/dL                     1.1-9.1   Albumin                   4.2 g/dL                    4.7-8.2  Comments:      TSH (TSH)   FastTSH                   2.71 uIU/mL                 0.35-5.50  B12 + Folate Panel (B12/FOL)   Vitamin B12               302 pg/mL                   211-911   Folate                    19.9 ng/mL  IBC Panel (IBC)   Iron                      94 ug/dL                    95-621   Transferrin               289.8 mg/dL                 308.6-578.4   Iron Saturation           23.2 %                      20.0-50.0   Impression & Recommendations:  Problem # 1:  WEIGHT LOSS (ICD-783.21) Appetite is OK she says, and wt up 3# w/ the ensure... continue supplements...  Problem # 2:  COPD (ICD-496) Stable>  continue Advair, NEBS... Her updated medication list for this problem includes:    Albuterol Sulfate (2.5 Mg/58ml) 0.083% Nebu (Albuterol sulfate) .Marland Kitchen... 1 vial via nebulizer up to 4 times a day as needed for wheezing...    Advair Diskus 250-50 Mcg/dose Misc (Fluticasone-salmeterol) .Marland Kitchen... 1 inhalation two times a day  Problem # 3:  Hx of CARCINOMA, LUNG, SQUAMOUS CELL (ICD-162.9) CXR w/o acute changes... no known recurrence.  Problem # 4:  GERD (ICD-530.81) Continue meds... Her updated medication list for this problem includes:    Prevacid 24hr 15 Mg Cpdr (Lansoprazole) .Marland Kitchen... Take 1 cap by mouth once daily...    Bentyl 20 Mg Tabs (Dicyclomine hcl) .Marland Kitchen... Take one tablet by mouth three times a day as needed for abd cramping  Problem # 5:  Hx of ADENOCARCINOMA, BREAST (ICD-174.9) Stable>  no known recurrence...  Problem # 6:  DEGENERATIVE JOINT DISEASE (ICD-715.90) Stable>  use the Tramadol as needed...  Her updated medication list for this problem includes:    Tramadol Hcl 50 Mg Tabs (Tramadol hcl) .Marland Kitchen... Take 1 tablet by mouth three times a day as needed for pain  Problem # 7:  OSTEOPOROSIS (ICD-733.00) Continue bisphos, calcium MVI, Vit D... Her updated medication list for this  problem includes:  Boniva 150 Mg Tabs (Ibandronate sodium) .Marland Kitchen... 1 by mouth every month  Problem # 8:  ANEMIA (ICD-285.9) Rec to take Vit B12- 1000 micrograms/d as supplement... Her updated medication list for this problem includes:    Vitamin B-12 1000 Mcg Tabs (Cyanocobalamin) .Marland Kitchen... Take one tab daily  Complete Medication List: 1)  Cvs Loratadine 10 Mg Tabs (Loratadine) .... As needed 2)  Albuterol Sulfate (2.5 Mg/40ml) 0.083% Nebu (Albuterol sulfate) .Marland Kitchen.. 1 vial via nebulizer up to 4 times a day as needed for wheezing... 3)  Advair Diskus 250-50 Mcg/dose Misc (Fluticasone-salmeterol) .Marland Kitchen.. 1 inhalation two times a day 4)  Prevacid 24hr 15 Mg Cpdr (Lansoprazole) .... Take 1 cap by mouth once daily.Marland KitchenMarland Kitchen 5)  Bentyl 20 Mg Tabs (Dicyclomine hcl) .... Take one tablet by mouth three times a day as needed for abd cramping 6)  Boniva 150 Mg Tabs (Ibandronate sodium) .Marland Kitchen.. 1 by mouth every month 7)  Caltrate 600+d Plus 600-400 Mg-unit Tabs (Calcium carbonate-vit d-min) .... Take 1 tab by mouth daily.Marland KitchenMarland Kitchen 8)  Tramadol Hcl 50 Mg Tabs (Tramadol hcl) .... Take 1 tablet by mouth three times a day as needed for pain 9)  Neurontin 300 Mg Caps (Gabapentin) .Marland Kitchen.. 1 by mouth three times a day 10)  Chlordiazepoxide Hcl 10 Mg Caps (Chlordiazepoxide hcl) .... Take 1 capsule by mouth three times a day as needed for nerves... 11)  Zoloft 100 Mg Tabs (Sertraline hcl) .Marland Kitchen.. 1 by mouth once daily 12)  Remeron 15 Mg Tabs (Mirtazapine) .Marland Kitchen.. 1 by mouth once daily 13)  Methotrexate Sodium 25 Mg/ml Soln (Methotrexate sodium) .... Gets injection every 6 wks 14)  Triamcinolone Cream  .... Apply to affected area as needed 15)  Womens Multivitamin Plus Tabs (Multiple vitamins-minerals) .... Take 1 tab daily 16)  Vitamin B-12 1000 Mcg Tabs (Cyanocobalamin) .... Take one tab daily 17)  Vitamin D3 2000 Unit Caps (Cholecalciferol) .... Take 1 cap daily  Other Orders: Influenza Vaccine MCR (96295)  Patient Instructions: 1)  Today  we updated your med list- see below.... 2)  Remember to take your Calcium, Women's Multivit, Vit B12 & Vit D every day.... 3)  Your recent CXR & labs looked good... 4)  We gave you the 2011 seasonal Flu vaccine today... 5)  Continue the ENSURE supplements... 6)  Call for any questions.Marland KitchenMarland Kitchen 7)  Please schedule a follow-up appointment in 6 months.   Immunizations Administered:  Influenza Vaccine # 1:    Vaccine Type: Fluvax MCR    Site: left deltoid    Mfr: GlaxoSmithKline    Dose: 0.5 ml    Route: IM    Given by: Zackery Barefoot CMA    Exp. Date: 06/07/2011    Lot #: [id number]    VIS given: 07/02/10 version given August 23, 2010.  Flu Vaccine Consent Questions:    Do you have a history of severe allergic reactions to this vaccine? no    Any prior history of allergic reactions to egg and/or gelatin? no    Do you have a sensitivity to the preservative Thimersol? no    Do you have a past history of Guillan-Barre Syndrome? no    Do you currently have an acute febrile illness? no    Have you ever had a severe reaction to latex? no    Vaccine information given and explained to patient? yes    Are you currently pregnant? no

## 2011-01-07 NOTE — Assessment & Plan Note (Signed)
Summary: 6 month return/mh   CC:  6 month ROV & review of mult medical problems....  History of Present Illness: 77 y/o WF here for a 6 month follow up visit... she has mult medical problems as listed...    ~  Sep10:  she has been stable (confirmed by her daughter), no new complaints or concerns... she is due for f/u CXR (stable- NAD,no recurrence), lab work (all OK), and BMD (severe osteoporosis but sl improved)...   ~  February 20, 2010:  she remains stable w/o new complaints or concerns, requesting refill of all meds for 2011... prev BMD showed sl improvement in TScores on the Boniva, Calcium, Vit D...    Current Problem List:  HEARING LOSS (ICD-389.9) - she refuses hearing eval...  COPD (ICD-496) - ex-smoker... on ALBUT NEBS up to Qid, ADVAIR 250Bid, & Prn Mucinex...  ~  CTChest 6/06 w/ stable post-op changes on left, no recurrence, scarring RLL, sm gallstones...  ~  CXR 8/08 w/ vol loss & post-op changes on left, right clear...   ~  CXR 9/09 showed stable post op changes on the left, NAD.Marland Kitchen.  ~  CXR 9/10 unchanged- stable post op appearance of left chest, NAD...  Hx of CARCINOMA, LUNG, SQUAMOUS CELL (ICD-162.9) - s/p left thoracotomy w/ LLLobectomy for squamous cell ca 10/99 by DrBurney... no known recurrence.  GERD (ICD-530.81) - on PREVACID 15mg /d OTC... last EGD was 11/99 showing GERD, otherw neg... 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  ** note: she states the Prevacid really helps and the generic Omeprazole didn't help...  IBS (ICD-564.1) - on BENTYL 20mg  prn...last colonoscopy was 7/03 by DrPerry & was normal- no pathologic findings...  GALLSTONES (ICD-574.20)  Hx of ALCOHOLISM (ICD-303.90)  Hx of ADENOCARCINOMA, BREAST (ICD-174.9) - DrMagrinat stopped her Tamoxifen after 57yrs and released her on 12/08... she had a left breast lumpectomy and sentinel node biopsy 12/03 by [facility] for a 2.2cm  infiltrating carcinoma, neg LN's, ER/PR pos, treated w/ XRT, then Tamoxifen for 29yrs...  ~  Mammogram 5/09 was negative...  ~  Mammogram 5/10 at [facility] was neg- fatty replacement, post-op changes...  DEGENERATIVE JOINT DISEASE (ICD-715.90) - she's had a prev lumbar laminectomy & a fractured left hip after a fall (repaired by DrDuda [id number])...  OSTEOPOROSIS (ICD-733.00) - she is on BONIVA 150mg /month along w/ calcium and vitD supplements...  ~  BMD 8/08 shows severe osteoporosis w/ TScores -3.5 in the spine and -3.7 in the hip...  improved from 5/06 study!  ~  Vit D level 3/09 = 30 & 50K/wk Rx started...   ~  Vit D level 9/09 = 66 & switched to 1000u OTC daily...  ~  BMD 9/10 showed TScores -3.3 spine, and -3.1 in right fem neck... sl improved from 2008, continue Rx, avoid trauma.                        NEUROPATHY (ICD-355.9) - on NEURONTIN 300mg  Tid... it really helps her back pain.  ANXIETY (ICD-300.00) - on LIBRIUM 10mg tid, ZOLOFT 100mg /d and REMERON 15mg Qhs... she wishes to continue all of these the same.  PSORIASIS (ICD-696.1) - prev treated by Elnora Morrison w/ MTX...  ANEMIA (ICD-285.9) -   ~  labs 3/09 showed Hg= 11.9 w/ Fe= 67... started Feosol 200mg /d...  ~  labs 9/09 showed Hg= 13.3 w/ Fe= 84  ~  labs 9/10 showed Hg= 13.3, Fe= 111  Allergies (verified): No Known Drug Allergies  Comments:  Nurse/Medical Assistant: The patient's medications and allergies were reviewed with the patient and were updated in the Medication and Allergy Lists.  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 mother deceased age 74 from sepsis father deceased age 64 from auto accident 1 sibling deceased age 6 from cancer--breast and lung  Social History: Reviewed history from 08/11/2008 and no changes required. quit smoking in 1999--smoked for 40 years no exercise caffeine use:  2 cups per week quit drinking in 1987 widowed 4 children  Review of Systems      See HPI       The patient complains of anorexia, decreased hearing, dyspnea on exertion, muscle weakness, and difficulty walking.  The patient denies fever, weight loss, weight gain, vision loss, hoarseness, chest pain, syncope, peripheral edema, prolonged cough, headaches, hemoptysis, abdominal pain, melena, hematochezia, severe indigestion/heartburn, hematuria, incontinence, suspicious skin lesions, transient blindness, depression, unusual weight change, abnormal bleeding, enlarged lymph nodes, and angioedema.    Vital Signs:  Patient profile:   76 year old female Height:      60 inches Weight:      82.50 pounds BMI:     16.17 O2 Sat:      93 % on Room air Temp:     96.7 degrees F oral Pulse rate:   66 / minute BP sitting:   112 / 66  (left arm) Cuff size:   regular  Vitals Entered By: Randell Loop CMA (February 20, 2010 11:32 AM)  O2 Sat at Rest %:  93 O2 Flow:  Room air CC: 6 month ROV & review of mult medical problems... Is Patient Diabetic? No Pain Assessment Patient in pain? no      Comments no changes in meds today   Physical Exam  Additional Exam:  WD, Thin, 77 y/o WF in NAD... she is chr ill apearing... GENERAL:  Alert & oriented; pleasant &  cooperative... HEENT:  Ensign/AT, EOM-wnl, PERRLA, EACs-clear, TMs-wnl, NOSE-clear, THROAT-clear & wnl. NECK:  Supple w/ fairROM; no JVD; normal carotid impulses w/o bruits; no thyromegaly or nodules palpated; no lymphadenopathy. CHEST:  Clear to P & A; prev left thoracotomy scar; without wheezes/ rales/ or rhonchi heard... HEART:  Regular Rhythm; without murmurs/ rubs/ or gallops detected... ABDOMEN:  Soft & nontender; normal bowel sounds; no organomegaly or masses palpated... EXT: without deformities, mild arthritic changes; no varicose veins/ venous insuffic/ or edema. NEURO:  CN's intact; no focal neuro deficits x mild neuropathy... DERM:  No lesions noted; no rash etc...    Impression & Recommendations:  Problem # 1:  COPD (ICD-496) Stable w/ severe disease... same meds. Her updated medication list for this problem includes:    Albuterol Sulfate (2.5 Mg/56ml) 0.083% Nebu (Albuterol sulfate) .Marland Kitchen... 1 vial via nebulizer up to 4 times a day as needed for wheezing...    Advair Diskus 250-50 Mcg/dose  Misc (Fluticasone-salmeterol) .Marland Kitchen... 1 inhalation two times a day  Problem # 2:  Hx of CARCINOMA, LUNG, SQUAMOUS CELL (ICD-162.9) No known recurrence, no lymphadenopathy, yearly CXR in the fall.  Problem # 3:  GERD (ICD-530.81) Stable on the Prev15 she says... Her updated medication list for this problem includes:    Prevacid 24hr 15 Mg Cpdr (Lansoprazole) .Marland Kitchen... Take 1 cap by mouth once daily...    Bentyl 20 Mg Tabs (Dicyclomine hcl) .Marland Kitchen... Take one tablet by mouth three times a day as needed for abd cramping  Problem # 4:  IBS (ICD-564.1) Stable on the Bentyl she says...  Problem # 5:  Hx of ADENOCARCINOMA, BREAST (ICD-174.9) No recurrent prob reported- yearly mammography has been OK.  Problem # 6:  DEGENERATIVE JOINT DISEASE (ICD-715.90) Aware-  use the Tramadol Prn... Her updated medication list for this problem includes:    Tramadol Hcl 50 Mg Tabs (Tramadol hcl) .Marland Kitchen... Take 1 tablet by  mouth three times a day as needed for pain  Problem # 7:  OSTEOPOROSIS (ICD-733.00) Stable on the Boniva, calcium, Vit D... Her updated medication list for this problem includes:    Boniva 150 Mg Tabs (Ibandronate sodium) .Marland Kitchen... 1 by mouth every month  Problem # 8:  ANXIETY (ICD-300.00) Hx chr pain, neuropathy, anxiety, insomnia, etc... she wishes to continue all of her current meds the same... Her updated medication list for this problem includes:    Chlordiazepoxide Hcl 10 Mg Caps (Chlordiazepoxide hcl) .Marland Kitchen... Take 1 capsule by mouth three times a day as needed for nerves...    Zoloft 100 Mg Tabs (Sertraline hcl) .Marland Kitchen... 1 by mouth once daily    Remeron 15 Mg Tabs (Mirtazapine) .Marland Kitchen... 1 by mouth once daily  Complete Medication List: 1)  [facility] Loratadine 10 Mg Tabs (Loratadine) .... As needed 2)  Albuterol Sulfate (2.5 Mg/27ml) 0.083% Nebu (Albuterol sulfate) .Marland Kitchen.. 1 vial via nebulizer up to 4 times a day as needed for wheezing... 3)  Advair Diskus 250-50 Mcg/dose Misc (Fluticasone-salmeterol) .Marland Kitchen.. 1 inhalation two times a day 4)  Prevacid 24hr 15 Mg Cpdr (Lansoprazole) .... Take 1 cap by mouth once daily.Marland KitchenMarland Kitchen 5)  Bentyl 20 Mg Tabs (Dicyclomine hcl) .... Take one tablet by mouth three times a day as needed for abd cramping 6)  Boniva 150 Mg Tabs (Ibandronate sodium) .Marland Kitchen.. 1 by mouth every month 7)  Caltrate 600+d Plus 600-400 Mg-unit Tabs (Calcium carbonate-vit d-min) .... Take 1 tab by mouth two times a day... 8)  Vitamin D3 2000 Unit Caps (Cholecalciferol) .... Take 1 cap by mouth once daily.Marland KitchenMarland Kitchen 9)  Tramadol Hcl 50 Mg Tabs (Tramadol hcl) .... Take 1 tablet by mouth three times a day as needed for pain 10)  Neurontin 300 Mg Caps (Gabapentin) .Marland Kitchen.. 1 by mouth three times a day 11)  Chlordiazepoxide Hcl 10 Mg Caps (Chlordiazepoxide hcl) .... Take 1 capsule by mouth three times a day as needed for nerves... 12)  Zoloft 100 Mg Tabs (Sertraline hcl) .Marland Kitchen.. 1 by mouth once daily 13)  Remeron 15 Mg Tabs  (Mirtazapine) .Marland Kitchen.. 1 by mouth once daily  Other Orders: Prescription Created Electronically [phone])  Patient Instructions: 1)  Today we updated your med list- see below.... 2)  We refilled your meds for 2011... 3)  Call for any problems.Marland KitchenMarland Kitchen 4)  Please schedule a follow-up appointment in 6 months, with f/u CXR & blood work at that time. Prescriptions: REMERON 15 MG  TABS (MIRTAZAPINE) 1 by mouth once daily  #30 x prn  Entered and Authorized by:   [name] MD   Signed by:   [name] MD on 02/20/2010   Method used:   Print then Give to Patient   RxID:   [id number] ZOLOFT 100 MG  TABS (SERTRALINE HCL) 1 by mouth once daily  #30 x prn   Entered and Authorized by:   [name] MD   Signed by:   [name] MD on 02/20/2010   Method used:   Print then Give to Patient   RxID:   [id number] CHLORDIAZEPOXIDE HCL 10 MG CAPS (CHLORDIAZEPOXIDE HCL) Take 1 capsule by mouth three times a day as needed for nerves...  #100 x prn   Entered and Authorized by:   [name] MD   Signed by:   [name] MD on 02/20/2010   Method used:   Print then Give to Patient   RxID:   [id number] NEURONTIN 300 MG  CAPS (GABAPENTIN) 1 by mouth three times a day  #90 x prn   Entered and Authorized by:   [name] MD   Signed by:   [name] MD on 02/20/2010   Method used:   Print then Give to Patient   RxID:   [id number] TRAMADOL HCL 50 MG TABS (TRAMADOL HCL) Take 1 tablet by mouth three times a day as needed for pain  #100 x prn   Entered and Authorized by:   [name] MD   Signed by:   [name] MD on 02/20/2010   Method used:   Print then Give to Patient   RxID:   [id number] BONIVA 150 MG  TABS (IBANDRONATE SODIUM) 1 by mouth EVERY MONTH  #1 x prn   Entered and Authorized by:   [name] MD   Signed by:   [name] MD on 02/20/2010   Method used:   Print then Give to Patient   RxID:   [id number] BENTYL 20 MG TABS (DICYCLOMINE HCL)  take one tablet by mouth three times a day as needed for abd cramping  #100 x prn   Entered and Authorized by:   [name] MD   Signed by:   [name] MD on 02/20/2010   Method used:   Print then Give to Patient   RxID:   [id number] ADVAIR DISKUS 250-50 MCG/DOSE  MISC (FLUTICASONE-SALMETEROL) 1 inhalation two times a day  #1 x prn   Entered and Authorized by:   [name] MD   Signed by:   [name] MD on 02/20/2010   Method used:   Print then Give to Patient   RxID:   [id number] ALBUTEROL SULFATE (2.5 MG/3ML) 0.083%  NEBU (ALBUTEROL SULFATE) 1 VIAL VIA NEBULIZER UP TO 4 TIMES A DAY as needed for wheezing...  #100 x prn   Entered and Authorized by:   [name] MD   Signed by:   [name] MD on 02/20/2010   Method used:   Print then Give to Patient   RxID:   [id number]

## 2011-02-21 ENCOUNTER — Ambulatory Visit (INDEPENDENT_AMBULATORY_CARE_PROVIDER_SITE_OTHER): Payer: Medicare Other | Admitting: Pulmonary Disease

## 2011-02-21 ENCOUNTER — Encounter: Payer: Self-pay | Admitting: Pulmonary Disease

## 2011-02-21 DIAGNOSIS — F1020 Alcohol dependence, uncomplicated: Secondary | ICD-10-CM

## 2011-02-21 DIAGNOSIS — J449 Chronic obstructive pulmonary disease, unspecified: Secondary | ICD-10-CM

## 2011-02-21 DIAGNOSIS — K589 Irritable bowel syndrome without diarrhea: Secondary | ICD-10-CM

## 2011-02-21 DIAGNOSIS — K219 Gastro-esophageal reflux disease without esophagitis: Secondary | ICD-10-CM

## 2011-02-21 DIAGNOSIS — K8020 Calculus of gallbladder without cholecystitis without obstruction: Secondary | ICD-10-CM

## 2011-02-21 DIAGNOSIS — H9190 Unspecified hearing loss, unspecified ear: Secondary | ICD-10-CM

## 2011-02-21 DIAGNOSIS — C50919 Malignant neoplasm of unspecified site of unspecified female breast: Secondary | ICD-10-CM

## 2011-02-21 DIAGNOSIS — C3490 Malignant neoplasm of unspecified part of unspecified bronchus or lung: Secondary | ICD-10-CM

## 2011-03-05 ENCOUNTER — Telehealth: Payer: Self-pay | Admitting: Pulmonary Disease

## 2011-03-05 NOTE — Telephone Encounter (Signed)
Spoke with pt and she states she is not sure what is going on but she states her pharmacy will not fill the medication and neither will the home health company.  I advised we will have to call the pharmacy tomorrow and see what is needed. Carron Curie, CMA

## 2011-03-07 NOTE — Telephone Encounter (Signed)
Spoke with pharmacists and she states pt albuterol needs PA. Number is [phone]. Will initiate PA on Monday. Pt states she has enough albuterol to last the weekend. Carron Curie, CMA

## 2011-03-10 NOTE — Telephone Encounter (Signed)
Called to initiate PA at the number given.  Was redirected to a different number [phone].  Spoke with Rep and she states that is medicare paid for her nebulizer machine, we should call the pharmacy and have them file this under part B.  She is going to send [place] PA fax just in case this is still needed.  Will call the pt to find out if medicare covered her neb machine. ATC pt LMTCB.

## 2011-03-10 NOTE — Telephone Encounter (Signed)
Spoke with pt and she states she has had her nebulizer for 12 years and not sure if medicare paid for it. Spoke with pharmacists to see if PA was needed and to get them to file under part b. Pharmacists states they did not need a PA. What they informed pt was they needed a new insurance card on file. Advised pt of this and informed her the pharmacy will give her a call when her rx is ready for pick up and to take her insurance card with her. Pt stated she would.

## 2011-03-11 NOTE — Assessment & Plan Note (Signed)
Summary: 6 month rov   CC:  6 month ROV & review of mult medical problems....  History of Present Illness: 77 y/o WF here for a 6 month follow up visit... she has mult medical problems as listed...    ~  Sep10:  she has been stable (confirmed by her daughter), no new complaints or concerns... she is due for f/u CXR (stable- NAD,no recurrence), lab work (all OK), and BMD (severe osteoporosis but sl improved)...   ~  Mar11:  she remains stable w/o new complaints or concerns, requesting refill of all meds for 2011... prev BMD showed sl improvement in TScores on the Boniva, Calcium, Vit D...  ~  Sep11:  she saw TP 8/11 w/ c/o weight loss down to 78# & daugh wonders if it might not have been from depression- better now, appetite improved, & wt up 3# to 81# on Ensure supplements ("I just wasn't eating" she says)... offered Megace but she declines stating her appetite is OK & she will continue the Ensure + rec for Women's MVI, Vit B12 & Vit D 1000u daily... CXR 8/11- chr changes, NAD & labs all looked reasonable> reviewed w/ pt & daughter...   ~  March 16, 20012:  9mo ROV doing well w/o new complaints or concerns... she is off the MTX for her psoriasis & using Clobetasol cream... stable on her Advair/ Nebs... GI is stable on her PPI & Benty... Ortho stable on the Boniva,calcium, vits, Vit D & Tramadol... she requests refill prescriptions for 30d supplies...    Current Problem List:  HEARING LOSS (ICD-389.9) - she refuses hearing eval...  COPD (ICD-496) - ex-smoker... on ALBUT NEBS up to Qid, ADVAIR 250Bid, & Prn Mucinex...  ~  CTChest 6/06 w/ stable post-op changes on left, no recurrence, scarring RLL, sm gallstones...  ~  CXR 8/08 w/ vol loss & post-op changes on left, right clear...   ~  CXR 9/09 showed stable post op changes on the left, NAD.Marland Kitchen.  ~  CXR 9/10 unchanged- stable post op appearance of left chest, NAD.Marland Kitchen.  ~  CXR 8/11 showed post op changes & scarring, COPD, NAD...  Hx of  CARCINOMA, LUNG, SQUAMOUS CELL (ICD-162.9) - s/p left thoracotomy w/ LLLobectomy for squamous cell ca 10/99 by DrBurney... no known recurrence.  GERD (ICD-530.81) - on PREVACID 15mg /d OTC... last EGD was 11/99 showing GERD, otherw neg... 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  ** note: she states the Prevacid really helps and the generic Omeprazole didn't help...  IBS (ICD-564.1) - on BENTYL 20mg  prn...last colonoscopy was 7/03 by DrPerry & was normal- no pathologic findings...  GALLSTONES (ICD-574.20)  Hx of ALCOHOLISM (ICD-303.90)  Hx of ADENOCARCINOMA, BREAST (ICD-174.9) - DrMagrinat stopped her Tamoxifen after 24yrs and released her on 12/08... she had a left breast lumpectomy and sentinel node biopsy 12/03 by [facility] for a 2.2cm infiltrating carcinoma, neg LN's, ER/PR pos, treated w/ XRT, then Tamoxifen for 47yrs...  ~  Mammogram 5/09 was negative...  ~  Mammogram 5/10 at [facility] was neg- fatty replacement, post-op changes...  DEGENERATIVE JOINT DISEASE (ICD-715.90) - she's had a prev lumbar laminectomy & a fractured left hip after a fall (repaired by DrDuda [id number])...  OSTEOPOROSIS (ICD-733.00) - she is on BONIVA 150mg /month along w/ calcium and vitD supplements...  ~  BMD 8/08 shows severe osteoporosis w/ TScores -3.5 in the spine and -3.7 in the hip...  improved from 5/06  study!  ~  Vit D level 3/09 = 30 & 50K/wk Rx started...   ~  Vit D level 9/09 = 66 & switched to 1000u OTC daily...  ~  BMD 9/10 showed TScores -3.3 spine, and -3.1 in right fem neck... sl improved from 2008, continue Rx, avoid trauma.                        NEUROPATHY (ICD-355.9) - on NEURONTIN 300mg  Tid... it really helps her back pain.  ANXIETY (ICD-300.00) - on LIBRIUM 10mg tid, ZOLOFT 100mg /d and REMERON 15mg Qhs... she wishes to continue all of these the same. PSORIASIS (ICD-696.1) -  treated by Elnora Morrison w/ MTX- intol pills, now on  shots...  ANEMIA (ICD-285.9) - she is rec to take Women's MVI, Vit B12 1085mcg/d, Vit D 1000 u/d...  ~  labs 3/09 showed Hg= 11.9 w/ Fe= 67... started Feosol 200mg /d...  ~  labs 9/09 showed Hg= 13.3 w/ Fe= 84  ~  labs 9/10 showed Hg= 13.3, Fe= 111... pt stopped the Fe supplement.  ~  labs 8/11 showed Hg= 12.9, Fe= 94 (23%sat), B12= 302   Preventive Screening-Counseling & Management  Alcohol-Tobacco     Smoking Status: quit     Year Quit: 1999  Allergies (verified): No Known Drug Allergies  Past History:  Past Medical History: HEARING LOSS (ICD-389.9) COPD (ICD-496) Hx of CARCINOMA, LUNG, SQUAMOUS CELL (ICD-162.9) GERD (ICD-530.81) IBS (ICD-564.1) GALLSTONES (ICD-574.20) Hx of ALCOHOLISM (ICD-303.90) Hx of ADENOCARCINOMA, BREAST (ICD-174.9) DEGENERATIVE JOINT DISEASE (ICD-715.90) OSTEOPOROSIS (ICD-733.00) NEUROPATHY (ICD-355.9) ANXIETY (ICD-300.00) PSORIASIS (ICD-696.1) ANEMIA (ICD-285.9)  Past Surgical History: S/P lumbar laminectomy S/P 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 mother deceased age 63 from sepsis father deceased age 71 from auto accident 1 sibling deceased age 66 from cancer--breast and lung  Social History: Reviewed history from 08/23/2010 and no changes required. quit smoking in 1999--smoked for 40 years no exercise caffeine use:  2 cups per week quit drinking in 1987 widowed 4 children  Review of Systems      See HPI       The patient complains of dyspnea on exertion and muscle weakness.  The patient denies anorexia, fever, weight loss, weight gain, vision loss, decreased hearing, hoarseness, chest pain, syncope, peripheral edema, prolonged cough,  headaches, hemoptysis, abdominal pain, melena, hematochezia, severe indigestion/heartburn, hematuria, incontinence, suspicious skin lesions, transient blindness, difficulty walking, depression, unusual weight change, abnormal bleeding, enlarged lymph nodes, and angioedema.    Vital Signs:  Patient profile:   77 year old female Height:      60 inches Weight:      81.8 pounds O2 Sat:      97 % on Room air Temp:     96.8 degrees F oral Pulse rate:   82 / minute BP sitting:   108 / 72  (right arm) Cuff size:   regular  Vitals Entered By: Randell Loop CMA (February 21, 2011 10:58 AM)  O2 Sat at Rest %:  97 O2 Flow:  Room air CC: 6 month ROV & review of mult medical problems... Is Patient Diabetic? No Pain Assessment Patient in pain? no      Comments meds updated today with pt   Physical Exam  Additional Exam:  WD, Thin, 77 y/o WF in NAD... she is chr ill apearing... GENERAL:  Alert & oriented; pleasant & cooperative... HEENT:  [place]/AT, EOM-wnl, PERRLA, EACs-clear, TMs-wnl, NOSE-clear, THROAT-clear & wnl. NECK:  Supple w/ fairROM; no JVD; normal carotid impulses w/o bruits; no thyromegaly or nodules palpated; no lymphadenopathy. CHEST:  Clear to P & A; prev left thoracotomy scar; without wheezes/ rales/ or rhonchi heard... HEART:  Regular Rhythm; without murmurs/ rubs/ or gallops detected... ABDOMEN:  Soft & nontender; normal bowel sounds; no organomegaly or masses palpated... EXT: without deformities, mild arthritic changes; no varicose veins/ venous insuffic/ or edema. NEURO:  CN's intact; no focal neuro deficits x mild neuropathy... DERM:  No lesions noted; no rash etc...    Impression & Recommendations:  Problem # 1:  COPD (ICD-496) Stable on inhaler & nebs... Her updated medication list for this problem includes:    Albuterol Sulfate (2.5 Mg/75ml) 0.083% Nebu (Albuterol sulfate) .Marland Kitchen... 1 vial via nebulizer up to 3 times a day as needed for wheezing...    Advair Diskus 250-50  Mcg/dose Misc (Fluticasone-salmeterol) .Marland Kitchen... 1 inhalation two times a day  Problem # 2:  Hx of CARCINOMA, LUNG, SQUAMOUS CELL (ICD-162.9) CXR 8/11 reviewed> no sign recurrence, continue to monitor...  Problem # 3:  GERD (ICD-530.81) GI stqable on meds>  continue same. Her updated medication list for this problem includes:    Prevacid 24hr 15 Mg Cpdr (Lansoprazole) .Marland Kitchen... Take 1 cap by mouth once daily...    Bentyl 20 Mg Tabs (Dicyclomine hcl) .Marland Kitchen... Take one tablet by mouth three times a day as needed for abd cramping  Problem # 4:  DEGENERATIVE JOINT DISEASE (ICD-715.90) Stable on her med regimen... Her updated medication list for this problem includes:    Tramadol Hcl 50 Mg Tabs (Tramadol hcl) .Marland Kitchen... Take 1 tablet by mouth three times a day as needed for pain  Problem # 5:  OSTEOPOROSIS (ICD-733.00) She remains on Bisphos rx + calcium, vits, etc,.... Her updated medication list for this problem includes:    Boniva 150 Mg Tabs (Ibandronate sodium) .Marland Kitchen... 1 by mouth every month  Problem # 6:  OTHER MEDICAL PROBLEMS AS NOTED>>>  Complete Medication List: 1)  [facility] Loratadine 10 Mg Tabs (Loratadine) .... As needed 2)  Albuterol Sulfate (2.5 Mg/79ml) 0.083% Nebu (Albuterol sulfate) .Marland Kitchen.. 1 vial via nebulizer up to 3 times a day as needed for wheezing... 3)  Advair Diskus 250-50 Mcg/dose Misc (Fluticasone-salmeterol) .Marland Kitchen.. 1 inhalation two times a day 4)  Prevacid 24hr 15 Mg Cpdr (Lansoprazole) .... Take 1 cap by mouth once daily.Marland KitchenMarland Kitchen 5)  Bentyl 20 Mg Tabs (Dicyclomine hcl) .... Take one tablet by mouth three times a day as needed for abd cramping 6)  Boniva 150 Mg Tabs (Ibandronate sodium) .Marland Kitchen.. 1 by mouth every month 7)  Caltrate 600+d Plus 600-400 Mg-unit Tabs (Calcium carbonate-vit d-min) .... Take 1 tab by mouth daily.Marland KitchenMarland Kitchen 8)  Tramadol Hcl 50 Mg Tabs (Tramadol hcl) .... Take 1 tablet by mouth three times a day as needed for pain 9)  Neurontin 300 Mg Caps (Gabapentin) .Marland Kitchen.. 1 by mouth three times a  day 10)  Chlordiazepoxide Hcl 10 Mg Caps (Chlordiazepoxide hcl) .... Take 1 capsule by mouth three times a day as needed for nerves... 11)  Zoloft 100 Mg Tabs (Sertraline hcl) .Marland Kitchen.. 1 by mouth once daily 12)  Remeron 15 Mg Tabs (Mirtazapine) .Marland Kitchen.. 1 by mouth once daily at bedtime for sleep 13)  Womens Multivitamin Plus Tabs (Multiple vitamins-minerals) .Marland KitchenMarland KitchenMarland Kitchen  Take 1 tab daily 14)  Vitamin B-12 1000 Mcg Tabs (Cyanocobalamin) .... Take one tab daily 15)  Vitamin D3 2000 Unit Caps (Cholecalciferol) .... Take 1 cap daily 16)  Triamcinolone Cream  .... Apply to affected area as needed 17)  Clobetasol Propionate 0.05 % Crea (Clobetasol propionate) .... Use as directed  Patient Instructions: 1)  Today we updated your med list- see below.... 2)  We refil;led your meds for 2012... 3)  Keep up the good work on your nutrition & your weight... 4)  Call for any problems.Marland KitchenMarland Kitchen 5)  Please schedule a follow-up appointment in 6 months, with CXR & FASTING blood work at that time... Prescriptions: REMERON 15 MG  TABS (MIRTAZAPINE) 1 by mouth once daily at bedtime for sleep  #30 x 12   Entered and Authorized by:   [name] MD   Signed by:   [name] MD on 02/21/2011   Method used:   Print then Give to Patient   RxID:   [id number] ZOLOFT 100 MG  TABS (SERTRALINE HCL) 1 by mouth once daily  #30 x 12   Entered and Authorized by:   [name] MD   Signed by:   [name] MD on 02/21/2011   Method used:   Print then Give to Patient   RxID:   [id number] CHLORDIAZEPOXIDE HCL 10 MG CAPS (CHLORDIAZEPOXIDE HCL) Take 1 capsule by mouth three times a day as needed for nerves...  #90 x 12   Entered and Authorized by:   [name] MD   Signed by:   [name] MD on 02/21/2011   Method used:   Print then Give to Patient   RxID:   [id number] NEURONTIN 300 MG  CAPS (GABAPENTIN) 1 by mouth three times a day  #90 x 12   Entered and Authorized by:   [name] MD   Signed by:   [name] MD on 02/21/2011   Method used:   Print then Give to Patient   RxID:   [id number] TRAMADOL HCL 50 MG TABS (TRAMADOL HCL) Take 1 tablet by mouth three times a day as needed for pain  #90 x 12   Entered and Authorized by:   [name] MD   Signed by:   [name] MD on 02/21/2011   Method used:   Print then Give to Patient   RxID:   [id number] BONIVA 150 MG  TABS (IBANDRONATE SODIUM) 1 by mouth EVERY MONTH  #1 x 12   Entered and Authorized by:   [name] MD   Signed by:   [name] MD on 02/21/2011   Method used:   Print then Give to Patient   RxID:   [id number] BENTYL 20 MG TABS (DICYCLOMINE HCL) take one tablet by mouth three times a day as needed for abd cramping  #90 x 12   Entered and Authorized by:   [name] MD   Signed by:   [name] MD on 02/21/2011   Method used:   Print then Give to Patient   RxID:   [id number] ADVAIR DISKUS 250-50 MCG/DOSE  MISC (FLUTICASONE-SALMETEROL) 1 inhalation two times a day  #1 x 12   Entered and Authorized by:   [name] MD   Signed by:   [name] MD on 02/21/2011   Method used:   Print then Give to Patient   RxID:  [id number] ALBUTEROL SULFATE (2.5 MG/3ML) 0.083%  NEBU (ALBUTEROL SULFATE) 1 VIAL VIA NEBULIZER UP TO 3 TIMES A DAY as needed for wheezing...  #90 x 12   Entered and Authorized by:   [name] MD   Signed by:   [name] MD on 02/21/2011   Method used:   Print then Give to Patient   RxID:   [id number]

## 2011-03-17 ENCOUNTER — Telehealth: Payer: Self-pay | Admitting: *Deleted

## 2011-03-17 NOTE — Telephone Encounter (Signed)
Form received and given to Leigh. 

## 2011-03-17 NOTE — Telephone Encounter (Signed)
Forms have been signed and faxed back and waiting on approval form.

## 2011-03-17 NOTE — Telephone Encounter (Signed)
PA initiated for Albuterol Nebulizer Solution through [facility] script at [phone]. Member ID # is [id number]. Awaiting fax.

## 2011-03-18 MED ORDER — MIRTAZAPINE 15 MG PO TBDP: 15.0000 mg | ORAL_TABLET | Freq: Every day | ORAL | Status: DC

## 2011-03-18 MED ORDER — ALBUTEROL SULFATE (2.5 MG/3ML) 0.083% IN NEBU: INHALATION_SOLUTION | RESPIRATORY_TRACT | Status: DC

## 2011-03-18 MED ORDER — SERTRALINE HCL 100 MG PO TABS: 100.0000 mg | ORAL_TABLET | Freq: Every day | ORAL | Status: DC

## 2011-03-18 NOTE — Telephone Encounter (Signed)
Patient calling to check on the status of albuterol rx.

## 2011-03-18 NOTE — Telephone Encounter (Signed)
Forms were just faxed back yesterday for the albuterol and this process can take up to 72 hours.  We will see if we get any response in the morning from the insurance company about the approval  and let her know.  thanks

## 2011-03-18 NOTE — Telephone Encounter (Signed)
Albuterol Nebs need to be filed through [facility] Part B per insurance. Denial letter received for [facility] Part D. New RX sent to [facility] on [address]. To reflect this.  Pt is also requesting refills for her sertraline and mirtazapine. Pls advise

## 2011-03-18 NOTE — Telephone Encounter (Signed)
Called and spoke with pt and she is aware of refills sent to the pharmacy

## 2011-03-19 ENCOUNTER — Other Ambulatory Visit: Payer: Self-pay | Admitting: Pulmonary Disease

## 2011-03-31 ENCOUNTER — Telehealth: Payer: Self-pay | Admitting: Pulmonary Disease

## 2011-03-31 MED ORDER — AMOXICILLIN-POT CLAVULANATE 875-125 MG PO TABS: 1.0000 | ORAL_TABLET | Freq: Two times a day (BID) | ORAL | Status: AC

## 2011-03-31 NOTE — Telephone Encounter (Signed)
Spoke w/ pt and made her aware of SN recs. Pt verbalized understanding and is aware rx was sent to pharmacy. Nothing further was needed

## 2011-03-31 NOTE — Telephone Encounter (Signed)
Per SN---please call in augmentin 875mg   #14  1 po bid  And use mucinex 600mg    2 po bid and increase fluids.  thanks

## 2011-03-31 NOTE — Telephone Encounter (Signed)
Called and spoke with pt and she stated that she has been trying to get rid of this for 2 wks. This comes and goes.  Started today with yellow sputum, pt stated had a fever for 1-2 days but not now.  Requesting abx for this.  NKDA .  SN please advise. thanks

## 2011-04-01 ENCOUNTER — Other Ambulatory Visit: Payer: Self-pay | Admitting: Pulmonary Disease

## 2011-04-09 ENCOUNTER — Telehealth: Payer: Self-pay | Admitting: Pulmonary Disease

## 2011-04-09 NOTE — Telephone Encounter (Signed)
Pt aware she may use Omeprazole 20 mg OTC until she can find OTC Prevacid.

## 2011-04-25 NOTE — Discharge Summary (Signed)
[name], Leslie [name]                         ACCOUNT NO.:  [id number]   MEDICAL RECORD NO.:  [id number]                   PATIENT TYPE:  INP   LOCATION:  5018                                 FACILITY:  [facility]   PHYSICIAN:  Nadara Mustard, M.D.                DATE OF BIRTH:  [birth year]   DATE OF ADMISSION:  05/06/2004  DATE OF DISCHARGE:  05/10/2004                                 DISCHARGE SUMMARY   DIAGNOSIS:  Left subtrochanteric femur fracture.   PROCEDURE:  Left trochanteric femoral nail.   DISPOSITION:  Discharged to rehabilitation in stable condition.   FOLLOWUP:  Follow up in the office in two weeks.   HISTORY OF PRESENT ILLNESS:  The patient is a 77 year old woman who fell at  home sustaining a comminuted left subtrochanteric femur fracture.  The  patient was evaluated, felt to be stable for surgical intervention.  The  patient's hospital course was essentially unremarkable.  She underwent a  left trochanteric femoral nail on May 07, 2004.  She received a spinal  anesthetic plus LMA.  She was started on Kefzol for infection prophylaxis  and Coumadin for DVT prophylaxis.  Postoperatively, the patient progressed  slowly with physical therapy.  Rehabilitation was consulted, and she was  felt to be a good candidate for rehabilitation.  The patient was discharged  to rehabilitation in stable condition on May 10, 2004, with followup in the  office two weeks after discharge.                                                Nadara Mustard, M.D.    MVD/MEDQ  D:  06/22/2004  T:  06/22/2004  Job:  [id number]

## 2011-04-25 NOTE — H&P (Signed)
NAME:  Leslie [name], Leslie [name]                         ACCOUNT NO.:  [id number]   MEDICAL RECORD NO.:  [id number]                   PATIENT TYPE:  INP   LOCATION:  5018                                 FACILITY:  [facility]   PHYSICIAN:  Nadara Mustard, M.D.                DATE OF BIRTH:  09-16-1929   DATE OF ADMISSION:  05/06/2004  DATE OF DISCHARGE:                                HISTORY & PHYSICAL   HISTORY OF PRESENT ILLNESS:  The patient is a 77 year old woman who states  that she tripped and fell at home on the day of admission.  The patient was  evaluated in the emergency room and felt to be stable for surgical  intervention and was scheduled for surgery the day after admission.  The  patient on admission, her vital signs were temperature 97, heart rate 87,  respiratory rate 18, blood pressure 130/80, height 5' 1, weight 99 pounds,  O2 sat 93% on room air.   MEDICATIONS:  1. Clidinium p.r.n.  2. Neurontin 300 mg t.i.d.  3. Prevacid 30 mg every day.  4. Tamoxifen 10 mg two p.o. every day.  5. Librium 10 mg t.i.d.  6. Darvocet-N 100 q.6h. p.r.n. for pain.  7. Mirtazapine 15 mg q.h.s.  8. Zoloft 100 mg every day.  9. Loratadine 10 mg every day.  10.      Advair 250/50 one puff b.i.d.   The patient has a negative history for alcohol and tobacco.   SOCIAL HISTORY:  Lives at home.   REVIEW OF SYSTEMS:  Positive for esophageal tumor, ruptured disk, previous  left ankle fracture, breast cancer status post lumpectomy, with a left lung  tumor status post lobectomy.  CARDIAC:  No problems.  PULMONARY:  Significant for shortness of breath and COPD.  NEUROLOGY:  History of  falling.  HEMATOLOGY:  Significant for anemia.  GI:  History for hiatal  hernia and irritable bowel syndrome.  MUSCULOSKELETAL:  Positive for  arthritis and joint pain and swelling.   PRIMARY CARE PHYSICIAN:  Dr. [name].   OBJECTIVE EXAMINATION:  LUNGS:  Clear to auscultation.  CARDIOVASCULAR:  Regular rate and rhythm.  NECK:  Supple.  No bruits.  EXTREMITIES:  Examination of the left lower extremity, her leg is shortened  and externally rotated.  She has pain reproduced with attempt at internal  and external rotation of the left hip.  She has a good dorsalis pedis pulse.   Radiographs were obtained which showed a comminuted subtrochanteric left  femur fracture.   ASSESSMENT:  A comminutes left subtrochanteric femur fracture.   PLAN:  The patient is scheduled for surgical intervention at this time.  The  risks and benefits were discussed including infection, neurovascular injury,  persistent pain, DVT, pulmonary embolus, failure of fixation.  The patient  states she understands and wishes to proceed at this time.  Nadara Mustard, M.D.    MVD/MEDQ  D:  05/07/2004  T:  05/07/2004  Job:  [id number]

## 2011-04-25 NOTE — Discharge Summary (Signed)
Leslie [name], Leslie [name]                         ACCOUNT NO.:  [id number]   MEDICAL RECORD NO.:  [id number]                   PATIENT TYPE:  IPS   LOCATION:  4149                                 FACILITY:  [facility]   PHYSICIAN:  Ranelle Oyster, M.D.             DATE OF BIRTH:  January 23, 1929   DATE OF ADMISSION:  05/10/2004  DATE OF DISCHARGE:  05/28/2004                                 DISCHARGE SUMMARY   DISCHARGE DIAGNOSES:  1. Left comminutes subtrochanteric femur fracture, status post trochanteric     femoral nail, May 07, 2004.  2. Pain management.  3. Anemia.  4. Coumadin for deep vein thrombosis prophylaxis.  5. Left breast cancer.  6. Chronic obstructive pulmonary disease.  7. Gastroesophageal reflux disease.  8. Depression.   HISTORY OF PRESENT ILLNESS:  A 77 year old white female admitted, May 06, 2004, after a fall at home without loss of consciousness, sustained a left  comminuted subtrochanteric femur fracture.  Underwent trochanteric femoral  nail, May 31, per Dr. Lajoyce Corners.  Placed on Coumadin for deep vein thrombosis  prophylaxis.  Weightbearing as tolerated.  Postoperative pain management.  Ambulating with a standard walker.  Latest INR of 3.3.  Admitted for  comprehensive rehab program.   PAST MEDICAL HISTORY:  1. Left breast cancer with left lung lobectomy resection per Dr. Edwyna Shell in     1999.  2. Chronic obstructive pulmonary disease.  3. Anemia.  4. Depression.   PAST SURGICAL HISTORY:  Ankle fracture.   PRIMARY CARE [facility]:  Dr. [name].   No alcohol or tobacco.   ALLERGIES:  None.   MEDICATIONS:  Prior to admission, were:  Neurontin, Prevacid, Nolvadex,  Librium, Zoloft, Advair, and Remeron.   SOCIAL HISTORY:  Lives with son in [place], independent prior to  admission, occasional driving.  One-level home, two-steps to entry.  Son  works second shift.  Has some local family.   HOSPITAL COURSE:  Patient with progressive gains while on rehab  services  with therapies initiated on a b.i.d. basis.  The following issues are  followed during the patient's rehab course.  Pertaining to Ms. Leslie [name]'s  left comminuted subtrochanteric femur fracture, trochanteric femoral nail,  May 07, 2004, surgical site healing nicely, weightbearing as tolerated,  staples had been removed, no signs of infection.  She was ambulating with  minimal assistance.  Pain management with the use of Vicodin and Neurontin  with good results.  Postoperative anemia, stable.  She remained on Coumadin  for deep vein thrombosis prophylaxis with latest INR of 2.3.  She will  complete Coumadin protocol.  She had a history of left breast cancer with  left lung lobectomy, per Dr. Edwyna Shell, in 1999.  Workup for this most recent  femur fracture with chest x-ray had showed a 2-cm diameter in the right lung  base, per chest x-ray.  CT of the chest, May 13, 2004, showed focal nodule  right lower lung 1.4-cm with irregular margins.  Dr. Edwyna Shell was consulted.  She underwent a PET scan at [facility] that was essentially  negative, question inflammatory processes.  A CT lung biopsy, May 20, 2004,  was again negative, per Dr. Edwyna Shell.  It was his thought that he should  follow up with the patient in the office in early July for consideration of  followup scans.  She received followup primary care medical per Dr. [name].  She did remain on Levaquin for a suspect pneumonia for a short time.  Oxygen  saturations greater than 92% on room air.   Overall, for her functional mobility, she was ambulating minimal assist with  a walker, minimal assist for transfers, needing some assistance for lower  body dressing.  Family would provide necessary supervision at home.   DISCHARGE MEDICATIONS:  1. Coumadin, latest dose 1 mg, to be completed on June 31, 2005.  2. Neurontin 300 mg three times daily.  3. Zoloft 100 mg daily.  4. Nolvadex 10 mg twice daily.  5. Advair one puff twice  daily.  6. Claritin 10 mg daily.  7. Remeron 15 mg at bedtime.  8. Prevacid 30 mg daily.  9. Librax one capsule three times daily.  10.      Humibid LA 600 mg twice daily.  11.      Vicodin as needed for pain.   ACTIVITY:  As tolerated.   DIET:  Regular.   SPECIAL INSTRUCTIONS:  1. Home health physical and occupational therapy as well as a nurse.  2. She should followup with:     a. Dr. Edwyna Shell, cardiothoracic surgery, call for appointment.     b. Dr. [name], medical management.     c. Dr. Lajoyce Corners, orthopedic services.      Mariam Dollar, P.A.                     Ranelle Oyster, M.D.    DA/MEDQ  D:  05/27/2004  T:  05/28/2004  Job:  [id number]   cc:   Ranelle Oyster, M.D.  [address] [place]  [place] [address]  Fax: [phone]   Nadara Mustard, M.D.  Fax: [phone]   Lonzo Cloud. [name], M.D. [facility]   Ines Bloomer, M.D.  [address]  [place]  [place] [address]

## 2011-04-25 NOTE — Op Note (Signed)
NAME:  Leslie Leslie [name], Leslie Leslie [name]                         ACCOUNT NO.:  [id number]   MEDICAL RECORD NO.:  [id number]                   PATIENT TYPE:  AMB   LOCATION:  DSC                                  FACILITY:  [facility]   PHYSICIAN:  Sandria Bales. Ezzard Standing, M.D.               DATE OF BIRTH:  29-Aug-1929   DATE OF PROCEDURE:  11/16/2002  DATE OF DISCHARGE:                                 OPERATIVE REPORT   CCS#:  [id number]   PREOPERATIVE DIAGNOSIS:  Left breast mass in retroareolar area, highly  suspicious for malignancy.   POSTOPERATIVE DIAGNOSIS:  Left breast mass in subareolar position, invasive  carcinoma, probable lobular.   OPERATION PERFORMED:  Left breast biopsy with left breast partial  mastectomy, injection of isosulfan blue and left sentinel lymph node biopsy.  The lymph nodes was blue and hot with counts of 1050, background of 5.   SURGEON:  Sandria Bales. Ezzard Standing, M.D.   ASSISTANT:  None.   ANESTHESIA:  General endotracheal.   ESTIMATED BLOOD LOSS:  Minimal.   INDICATIONS FOR PROCEDURE:  The patient is Leslie [name] 77 year old white female who  has undergone Leslie [name] recent mammogram and ultrasound at Dr. Cherlyn Labella office,  has Leslie [name] suspicious mass in her retroareolar position.  Leslie [name] needle aspiration  cytology has shown benign ductal tissue and this was done October 18, 2002.  I have spoken with the patient and her daughters.  She has Leslie [name] highly  suspicious mass in the retroareolar position that I am about 99% sure is  going to be malignant.  I told them that this could all be done as one  stage.  The patient really did not want to have to repeat core biopsy as an  outpatient, so we will do an open biopsy while she is asleep and if it is  positive, proceed with lumpectomy and sentinel lymph node biopsy.  She also  knows there is Leslie [name] possibility of axillary dissection.  Also discussed the  possibility of mastectomy with and without reconstruction but I thought she  was candidate for at least trying Leslie [name] lumpectomy as  her tumor is central and  her breast is small.   The indications and potential complications including but not limited to  bleeding, infection, recurrent tumor, lymphedema.   DESCRIPTION OF PROCEDURE:  The patient placed in supine position with her  arms out to her side.  Her left breast was prepped with Betadine solution  and sterilely draped.  First I ultrasounded her left breast and took Leslie [name]  picture of its tumor which was maybe about 1.2 to 1.3 cm in diameter by  ultrasound and put Leslie [name] picture in her chart.  Next I injected her subareolar  spaces with isosulfan blue as Leslie [name] marker top help in identifying Leslie [name] sentinel  lymph node if necessary.  Then we prepped her left breast and painted with  painted with Betadine solution.  First I made  Leslie [name] subareolar incision, excised  out Leslie [name] core of breast tissue about 3 x 3 cm down to the chest wall and sent  this off for Leslie [name] frozen section.  Dr. Clelia Croft said it looked like an invasive  carcinoma probably lobular.  The margins were closed but my plan all along  was to re-excise her nipple areolar complex and try to get Leslie [name] good 1 to 2 cm  margin around this mass.  So I then went back after the positive biopsy  called by Dr. Clelia Croft and did Leslie [name] wider excision trying to go at least 1 cm  around the mass down to the chest wall again.  Essentially I made Leslie [name] donut.  I marked the original biopsy medial margin as long suture, cranial margin as  short suture and then I marked the wide excision specimen using the little  metal markers medial, cranial and lateral.  She had pieces of skin which  were also oriented in that femur.   I then did Leslie [name] sentinel lymph node biopsy, identified Leslie [name] hot blue node after  making an incision in the left axilla which had counts of 1050 with  backgrounds of 5.  There was no other hot spots in the axilla.  I also  scanned her supraclavicular area and internal mammary area and that was all  cold.  The Touch Prep on the sentinel lymph node was negative.   I then  irrigated both the wounds, closed them with 3-0 Vicryl suture in  subcutaneous tissue with 5-0 Monocryl in the skin and painted with tincture  of Benzoin and Steri-Strips.  I did mark the left breast biopsy site with  small clips for anticipated radiation therapy.  The patient tolerated the  procedure well and was transported to the recovery room in good condition.                                               Sandria Bales. Ezzard Standing, M.D.    DHN/MEDQ  D:  11/16/2002  T:  11/16/2002  Job:  [id number]   cc:   Lonzo Cloud. [name], M.D. [facility]   Jeralyn Ruths, M.D.   Ines Bloomer, M.D.  [address]  [place]  [place] [address]  Fax: [phone]

## 2011-04-25 NOTE — Op Note (Signed)
Leslie [name], Leslie [name]                         ACCOUNT NO.:  [id number]   MEDICAL RECORD NO.:  [id number]                   PATIENT TYPE:  INP   LOCATION:  5018                                 FACILITY:  [facility]   PHYSICIAN:  Nadara Mustard, M.D.                DATE OF BIRTH:  08/01/1929   DATE OF PROCEDURE:  05/07/2004  DATE OF DISCHARGE:                                 OPERATIVE REPORT   PREOPERATIVE DIAGNOSIS:  Comminuted left subtrochanteric femur fracture.   POSTOPERATIVE DIAGNOSIS:  Comminuted left subtrochanteric femur fracture.   PROCEDURE:  Synthes trochanteric femoral nail 11 x 320 mm with a 90 mm  barrel.   SURGEON:  Nadara Mustard, M.D.   ANESTHESIA:  Spinal plus LMA.   ESTIMATED BLOOD LOSS:  Minimal.   ANTIBIOTICS:  Kefzol 1 g.   TOURNIQUET TIME:  None.   DISPOSITION:  To PACU in stable condition.   INDICATIONS FOR PROCEDURE:  The patient is a 77 year old woman who fell at  home yesterday.  She was admitted, evaluated, and felt to be stable for  surgical intervention and presents at this time for internal fixation of the  left hip fracture.  The risks and benefits were discussed, including  infection, neurovascular injury, persistent pain, failure of the hardware,  need for additional surgery, DVT, pulmonary embolus as well as mortality.  The patient states that she understands and wishes to proceed at this time.   DESCRIPTION OF PROCEDURE:  The patient is brought to OR room 4 and underwent  a spinal anesthetic.  The patient was then placed on the [facility] table with  the peroneal post well padded, and her left foot was well padded and placed  in the boot with distraction and the right lower extremity was placed in the  dorsal lithotomy position and was also well padded.  The patient did not  have a complete block at the time of surgery and she underwent an LMA  anesthetic.  After an adequate level of anesthesia was obtained, the  patient's left lower extremity  was prepped and draped in a sterile field  with a shower curtain.  A 2 inch incision was made just proximal to the  greater trochanter.  The tensor fasciae lata was flipped.  The guidewire was  placed and advanced across the femur from the greater trochanter down to the  shaft of the femoral canal.  ___________ fluoroscopy verified placement in  the AP and lateral plane, which was then over drilled with a 17.5 mm reamer.  The 11x 320 mm nail was chosen and this was advanced across the fracture,  fluoroscopy verified reduction of the AP and lateral planes.  The distal  guidewire was then placed with the nail centered in the AP plane just  posterior in the lateral plane.  This was sized for a 90 mm barrel.  The  cortex was drilled and the  90 mm barrel was inserted.  This was locked  proximally.  The wounds were  irrigated with normal saline.  Subcu is closed using 2-0 Vicryl, the skin  was closed using ________ staples.  Fluoroscopy verified reduction of both  AP and lateral planes.  The wounds were covered with Adaptic and thick  sponges, ABD dressing and Hypafix tape.  The patient was extubated and taken  to the PACU in stable condition.                                               Nadara Mustard, M.D.    MVD/MEDQ  D:  05/07/2004  T:  05/07/2004  Job:  [id number]

## 2011-05-12 ENCOUNTER — Telehealth: Payer: Self-pay | Admitting: Pulmonary Disease

## 2011-05-12 NOTE — Telephone Encounter (Signed)
Per EMR, tramadol rx last given to pt on 02/21/11 #90 x 12.  Called [facility], spoke with Autumn.  States they do have this rx but it was on hold.  She will fill this for pt.  Pt aware.

## 2011-05-13 ENCOUNTER — Other Ambulatory Visit: Payer: Self-pay | Admitting: Pulmonary Disease

## 2011-06-01 ENCOUNTER — Emergency Department (HOSPITAL_COMMUNITY): Payer: Medicare Other

## 2011-06-01 ENCOUNTER — Inpatient Hospital Stay (HOSPITAL_COMMUNITY)
Admission: EM | Admit: 2011-06-01 | Discharge: 2011-06-05 | DRG: 563 | Disposition: A | Discharge status: Skilled Nursing Facility | Payer: Medicare Other | Attending: Family Medicine | Admitting: Family Medicine

## 2011-06-01 DIAGNOSIS — M5030 Other cervical disc degeneration, unspecified cervical region: Secondary | ICD-10-CM | POA: Diagnosis present

## 2011-06-01 DIAGNOSIS — Z853 Personal history of malignant neoplasm of breast: Secondary | ICD-10-CM

## 2011-06-01 DIAGNOSIS — S42209A Unspecified fracture of upper end of unspecified humerus, initial encounter for closed fracture: Secondary | ICD-10-CM | POA: Diagnosis present

## 2011-06-01 DIAGNOSIS — Z681 Body mass index (BMI) 19 or less, adult: Secondary | ICD-10-CM

## 2011-06-01 DIAGNOSIS — K219 Gastro-esophageal reflux disease without esophagitis: Secondary | ICD-10-CM | POA: Diagnosis present

## 2011-06-01 DIAGNOSIS — I10 Essential (primary) hypertension: Secondary | ICD-10-CM | POA: Diagnosis present

## 2011-06-01 DIAGNOSIS — Z85118 Personal history of other malignant neoplasm of bronchus and lung: Secondary | ICD-10-CM

## 2011-06-01 DIAGNOSIS — R636 Underweight: Secondary | ICD-10-CM | POA: Diagnosis present

## 2011-06-01 DIAGNOSIS — W010XXA Fall on same level from slipping, tripping and stumbling without subsequent striking against object, initial encounter: Secondary | ICD-10-CM | POA: Diagnosis present

## 2011-06-01 DIAGNOSIS — S82009A Unspecified fracture of unspecified patella, initial encounter for closed fracture: Principal | ICD-10-CM | POA: Diagnosis present

## 2011-06-01 DIAGNOSIS — Y92009 Unspecified place in unspecified non-institutional (private) residence as the place of occurrence of the external cause: Secondary | ICD-10-CM

## 2011-06-01 DIAGNOSIS — Z902 Acquired absence of lung [part of]: Secondary | ICD-10-CM

## 2011-06-01 DIAGNOSIS — F341 Dysthymic disorder: Secondary | ICD-10-CM | POA: Diagnosis present

## 2011-06-01 DIAGNOSIS — J4489 Other specified chronic obstructive pulmonary disease: Secondary | ICD-10-CM | POA: Diagnosis present

## 2011-06-01 DIAGNOSIS — J449 Chronic obstructive pulmonary disease, unspecified: Secondary | ICD-10-CM | POA: Diagnosis present

## 2011-06-01 LAB — POCT I-STAT, CHEM 8
BUN: 13 mg/dL (ref 6–23)
Calcium, Ion: 1.2 mmol/L (ref 1.12–1.32)
Creatinine, Ser: 0.9 mg/dL (ref 0.50–1.10)
Glucose, Bld: 89 mg/dL (ref 70–99)
TCO2: 28 mmol/L (ref 0–100)

## 2011-06-01 LAB — URINALYSIS, ROUTINE W REFLEX MICROSCOPIC
Bilirubin Urine: NEGATIVE
Glucose, UA: NEGATIVE mg/dL
Hgb urine dipstick: NEGATIVE
Ketones, ur: NEGATIVE mg/dL
Protein, ur: NEGATIVE mg/dL

## 2011-06-02 LAB — COMPREHENSIVE METABOLIC PANEL
ALT: 13 U/L (ref 0–35)
Alkaline Phosphatase: 94 U/L (ref 39–117)
CO2: 29 mEq/L (ref 19–32)
Calcium: 8.8 mg/dL (ref 8.4–10.5)
GFR calc Af Amer: >60 mL/min (ref >60)
GFR calc non Af Amer: >60 mL/min (ref >60)
Glucose, Bld: 98 mg/dL (ref 70–99)
Potassium: 4 mEq/L (ref 3.5–5.1)
Sodium: 138 mEq/L (ref 135–145)

## 2011-06-02 LAB — DIFFERENTIAL
Eosinophils Absolute: 0.1 10*3/uL (ref 0.0–0.7)
Lymphs Abs: 2.6 10*3/uL (ref 0.7–4.0)
Monocytes Absolute: 0.8 10*3/uL (ref 0.1–1.0)
Monocytes Relative: 8 % (ref 3–12)
Neutrophils Relative %: 63 % (ref 43–77)

## 2011-06-02 LAB — CBC
MCH: 28.5 pg (ref 26.0–34.0)
MCV: 87.4 fL (ref 78.0–100.0)
Platelets: 201 10*3/uL (ref 150–400)
RBC: 3.97 MIL/uL (ref 3.87–5.11)

## 2011-06-03 LAB — URINE CULTURE
Colony Count: NO GROWTH
Culture: NO GROWTH

## 2011-06-04 DIAGNOSIS — S42209A Unspecified fracture of upper end of unspecified humerus, initial encounter for closed fracture: Secondary | ICD-10-CM

## 2011-06-04 DIAGNOSIS — S82009A Unspecified fracture of unspecified patella, initial encounter for closed fracture: Secondary | ICD-10-CM

## 2011-06-04 LAB — VITAMIN D 25 HYDROXY (VIT D DEFICIENCY, FRACTURES): Vit D, 25-Hydroxy: 68 ng/mL (ref 30–89)

## 2011-06-04 LAB — CBC
Platelets: 192 10*3/uL (ref 150–400)
RBC: 3.62 MIL/uL — ABNORMAL LOW (ref 3.87–5.11)
WBC: 9.3 10*3/uL (ref 4.0–10.5)

## 2011-06-04 LAB — BASIC METABOLIC PANEL
CO2: 28 mEq/L (ref 19–32)
Chloride: 103 mEq/L (ref 96–112)
Sodium: 137 mEq/L (ref 135–145)

## 2011-06-06 NOTE — Discharge Summary (Signed)
Leslie [name], Leslie [name]               ACCOUNT NO.:  [id number]  MEDICAL RECORD NO.:  [id number]  LOCATION:  1612                         FACILITY:  [facility]  PHYSICIAN:  Pleas Koch, MD        DATE OF BIRTH:  March 25, 1929  DATE OF ADMISSION:  06/01/2011 DATE OF DISCHARGE:  06/04/2011                              DISCHARGE SUMMARY   DISCHARGE DIAGNOSES: 1. Fall with polytrauma. 2. History of anxiety and depression. 3. Chronic obstructive pulmonary disease. 4. History of lung cancer and breast cancer status post treatment.  DISCHARGE MEDICATIONS: 1. Clobetasol 0.5% topically one application p.r.n. 2. Triamcinolone 0.1% topically p.r.n. 3. Neurontin 300 mg 1 tablet t.i.d. 4. Remeron 15 mg 1 tablet q.h.s. 5. Zoloft 100 mg 1 tablet daily. 6. Bentyl 20 mg 1 tablet t.i.d. p.r.n. 7. Atrovent 0.5 mg q.6 h. 8. Chlordiazepoxide 10 mg 1 tablet daily. 9. Boniva 150 mg 1 tablet every monthly. 10.Boost 237 mL at 10 a.m. and 8 p.m. 11.Polyethylene glycol 17 g daily. 12.Senokot-S 2 tablets daily. 13.Tramadol 50 mg one tablet t.i.d. p.r.n. 14.Omeprazole 1 cap b.i.d.  Please note, I have changed her dosing     from once daily to twice daily 20 mg 60 tablets prescribed. 15.Calcium carbonate 1 tablet b.i.d. with meals. 16.Loratadine 10 mg 1 tablet daily. 17.Advair 100/50 one puff b.i.d. 18.Albuterol inhaled one nebulization b.i.d. p.r.n.  I have discontinued her Caltrate plus D.  Please see full admission dictation by Dr. Adela Glimpse, job [id number]. Briefly, this is a 77 year old female with significant medical comorbidities, who lives at home, with walker and is ambulatory, does take care of herself, sustained a non-provoked fall, fell on the driveway, had trouble getting up, also had head injury, did not endorse loss of  consciousness or syncope.  No dizziness.  This was just a mechanical fall.  Numerous x-rays were done.  Dr. Otelia Sergeant was consulted by emergency physicians, who recommended this is more  outpatient management.  The patient was admitted for pain management.  Temperature 98.4; blood pressure 157/92, usually it is much lower per family; pulse 79; respirations 16; satting 90% on room air and 100% on 2 L oxygen.  She had slightly decreased skin turgor.  She is edentulous. She had right knee in immobilizer, right shoulder in sling, deformity around the right shoulder joint.  Hemoglobin was 12.9 on admission. Sodium 140, potassium 3.9, creatinine 0.9.  Pertinent imaging studies on admission, shoulder x-ray showed right humeral neck fracture.  Pelvic x-rays on June 01, 2011, showed no acute abnormalities.  Knee x-ray, four view, on June 01, 2011, showed nondisplaced transverse fracture of the lower pole of patella.  Chest x- ray on June 01, 2011, showed no acute abnormality.  CT of head on June 01, 2011, showed severe degenerative disk disease and facet disease. Subsequent anterolisthesis of C3-C4 and C4-C5, left C4-C5 joint is nearly purged.  These changes are related to severe degenerative disease.  CT C-spine concurred with the same.  HOSPITAL COURSE: 1. Fall and multi-trauma.  The patient was eventually seen once again     by Orthopedics, Dr. Lajoyce Corners who once again recommended     nonweightbearing to the right upper extremity  and she will probably     need a SNF.  He has recommended that the patient follow up with him     in the outpatient setting.  I did speak with the patient's daughter     on numerous occasions, phone number [phone], who subsequent to     patient being seen by Physical Therapy and as 24 x 7 care is not     available at home, the patient will be admitted to a nursing     facility for convalescence. 2. History of depression, anxiety.  The patient will continue Librium     10 daily, mirtazapine 15 q.h.s., and sertraline. 3. COPD, this is stable.  She will continue her albuterol and     ipratropium nebs. 4. History of lung and breast cancer.  The patient  will need to be     followed up with these with her primary oncologist. 5. Slightly elevated blood pressure.  This needs to be watched in     outpatient setting. 6. Reflux.  The patient had some burning in the stomach on the day of     discharge and her daughter relates to me that she usually takes     b.i.d. pantoprazole, she may likely need to continue on this as     b.i.d. dose and I will continue the same.  The patient seen on day of discharge.  She had some stomach upset, but otherwise was doing well.  She was lucid and conversing well. Temperature 98.3, pulse 95, respirations 18, O2 sat 90% on room air. Abdomen was soft, nontender.  Multiple prosthesis and slings were placed including a right upper extremity sling and right lower extremity cast. Chest was clinically clear.  S1, S2.  Murmur at left upper sternal edge 3/6.  The patient had been seen by Rehab and they do not think that she would be able to cope with intensive therapy.  As such, we will hope for discharge to nursing facility and I have alerted family about the same.  It was pleasure taking care of this patient.          ______________________________ Pleas Koch, MD     JS/MEDQ  D:  06/04/2011  T:  06/04/2011  Job:  [id number]  cc:   Lonzo Cloud. [name], MD [address] [place] [address]  Nadara Mustard, MD Fax: [phone]  Electronically Signed by Pleas Koch MD on 06/06/2011 08:33:46 PM

## 2011-06-06 NOTE — Discharge Summary (Signed)
  Leslie [name], KINGMA NO.:  [id number]  MEDICAL RECORD NO.:  [id number]  LOCATION:  1612                         FACILITY:  [facility]  PHYSICIAN:  Pleas Koch, MD        DATE OF BIRTH:  February 11, 1929  DATE OF ADMISSION:  06/01/2011 DATE OF DISCHARGE:                              DISCHARGE SUMMARY   ADDENDUM:  The patient is not discharged on June 04, 2011, the patient significant discharged on June 05, 2011.  She was doing well.  She had mild shoulder pain.  Temperature is 98.1, pulse 89, respirations 14, blood pressure 130 to 144 over 75 to 83, saturating 92% on room air. She is ambulating without any problems with her legs, however, continued to have low-grade shoulder pain.  She was able to urinate without Foley and hence was able to be discharged home.  She did have vitamin B level which was 68 and as such would not need repletion of the same.  This may need to be checked in the future.  I have discussed with her daughter again this morning regarding her care and the patient can be discharged to the nursing home safely.          ______________________________ Pleas Koch, MD     JS/MEDQ  D:  06/05/2011  T:  06/05/2011  Job:  [id number]  cc:   [facility]  Electronically Signed by Pleas Koch MD on 06/06/2011 08:34:00 PM

## 2011-06-15 NOTE — H&P (Signed)
Leslie [name], Leslie [name]               ACCOUNT NO.:  [id number]  MEDICAL RECORD NO.:  [id number]  LOCATION:  [facility]LED                         FACILITY:  [facility]  PHYSICIAN:  Michiel Cowboy, MDDATE OF BIRTH:  28-May-1929  DATE OF ADMISSION:  06/01/2011 DATE OF DISCHARGE:                             HISTORY & PHYSICAL   PRIMARY CARE PROVIDER:  Lonzo Cloud. [name], MD  ORTHOPEDICS:  Nadara Mustard, MD  CHIEF COMPLAINT:  Fall and pain in right leg and right arm.  The patient is a 77 year old female with past medical history significant for COPD, anxiety, lung cancer status post partial lung removal, and breast cancer.  The patient lives at home and her son also lives with her.  She is ambulatory and sometimes needs a walker, but sometimes able to ambulate on her own strength.  She takes care of her garden.  She is a very frail, thin female.  Today, she went out of the house to look at the weather and bent, tripped, and fell on driveway. She had trouble getting up, also had a head injury.  Did not endorse any loss of consciousness or syncope.  She did not became dizzy.  This was a pure slip and fall.  She was brought in to [facility] Emergency Department.  CT scan of the spine, CT scan of the head, chest x-ray, pelvic films were done.  Right knee film showing patellar fracture which is mild.  Right shoulder film showed humeral neck fracture.  At which point, the ER physician spoke to Dr. Otelia Sergeant who states that the patient can be treated with outpatient management.  The patient may weightbear her right leg with knee stabilizer and the right humerus fracture only requires a sling, but given the fact that she is a frail female with a risk of fall given as to injury will be very high at this point.  Per family discussion as well as discussion with ER physician, it was decided that it is best for her to be admitted for pain management and then perhaps to have physical therapy evaluation for rehab  placement.  Otherwise, the patient had not had any recent chest pain or shortness of breath, no fever, no chills.  She does have occasional constipation.  Her weight has been stably low recently, but one year ago or so she has lost a lot of weight and currently she just has poor p.o. intake which is chronic for her and her primary care provider is aware of that.  Otherwise, review of systems is negative, 10 systems are reviewed.  PAST MEDICAL HISTORY: 1. Anxiety. 2. History of left-sided breast cancer. 3. Anemia. 4. COPD, not on oxygen. 5. GERD. 6. Mild depression. 7. Anxiety. 8. History of lung cancer, status post left lower lobe lobectomy in     2000. 9. History of chronic back pain secondary to severe degenerative disk     disease. 10.Status post left hip fracture, repaired by Dr. Lajoyce Corners, also history     of left ankle fracture and left wrist fracture. 11.History of psoriasis.  ALLERGIES:  FENTANYL in the past that made her extra sleepy and/or sedated.  MEDICATIONS: 1. Loratadine  10  mg as needed. 2. Albuterol as needed. 3. Advair 125/50 twice daily. 4. Prevacid daily. 5. Bentyl 20 mg 3 times a day. 6. Boniva 150 monthly. 7. Calcitrate  with vitamin D daily. 8. Tramadol 50 mg 3 times a day. 9. Neurontin 300 mg 3 times a day. 10.Chlordiazepoxide 10 mg as needed. 11.Zoloft 100 mg daily. 12.Remeron 15 mg at bedtime. 13.Multivitamins. 14.Vitamin B12. 15.Vitamin D3. 16.Triamcinolone as needed. 17.__________ as needed. 18.The patient's daughter thinks she might be taking Elavil, but she     is not sure what dose.  PHYSICAL EXAMINATION:  VITAL SIGNS:  Temperature 98.4, blood pressure 157/92.  Of note, her family states it is usually in much lower, sometimes have been in 90s to 36s.  Pulse 79, respirations 16, saturating 90% on room, 100% on 2 L. GENERAL:  The patient appears to be in no acute distress currently. HEENT:  HEAD, nontraumatic.  The patient is edentulous.   Mucous membranes slightly dry.  She has slightly decreased skin turgor. LUNGS:  Clear to auscultation anteriorly. HEART:  Regular rate and rhythm.  No murmurs appreciated. ABDOMEN:  Soft, but very thin, nontender, nondistended. LOWER EXTREMITIES:  Without clubbing, cyanosis, or edema. SKIN:  Significant for rash which is consistent with history of psoriasis. MUSCULOSKELETAL:  Right knee is in knee immobilizer. Right shoulder is in a sling.  There is a deformity around the right shoulder joint.  LABORATORY DATA:  Hemoglobin 12.9.  Sodium 140, potassium 3.9, creatinine 0.9.  IMAGING:  She had a CT scan of the spine which did not show any acute changes.  A CT scan of the head did not show any acute changes.  Chest x- ray did not show anything acute.  Right knee showed patellar fracture. Pelvis was unremarkable and right shoulder showed humeral neck fracture.  ASSESSMENT AND PLAN: 1. This is a 77 year old female with past history of lung cancer,     chronic obstructive pulmonary disease, and breast cancer who     presents after a mechanical fall with patellar fracture and right     humeral neck fracture. 2. Multi-trauma.  Given the patient's advanced age and questionable     mobility at baseline, we will admit for pain management.  She can     continue followup with Dr. Audrie Lia office/Dr. Nitka's office once     she is discharged and most likely will need PT/OT and perhaps a     rehab placement.  We will make sure that she has deep vein     thrombosis prophylaxis.  Given her advanced age, she is at risk of     complications such as developing urinary tract infection or     confusion, we will watch for those signs.  We will write for also     incentive spirometer. 3. History of anxiety, depression.  Continue home medications. 4. History of back pain.  Continue home medications. 5. Gastroesophageal reflux disease.  Write on Protonix. 6. Chronic obstructive pulmonary disease.  This  appears to be     currently completely stable, but the patient apparently would     benefit from 2 L of O2 while ambulating and continue her     nebulizers. 7. Prophylaxis.  Protonix and heparin subcutaneously.  CODE STATUS:  The patient wished to be do not resuscitate, do not intubate which was confirmed with her daughter.  I spent a total of about 55 minutes on this admission.     Michiel Cowboy, MD  AVD/MEDQ  D:  06/01/2011  T:  06/01/2011  Job:  [id number]  cc:   Kerrin Champagne, M.D. Fax: [phone]  Lonzo Cloud. [name], MD [address] [place] [address]  Nadara Mustard, MD Fax: [phone]  Electronically Signed by Therisa Doyne MD on 06/15/2011 02:57:36 AM

## 2011-07-15 ENCOUNTER — Telehealth: Payer: Self-pay | Admitting: Pulmonary Disease

## 2011-07-15 NOTE — Telephone Encounter (Signed)
Ok per SN to give verbal order for bedside commode and home health aid. thanks

## 2011-07-15 NOTE — Telephone Encounter (Signed)
Please advise if ok to give verbal order for bedside commode and also home health aid. Thanks!

## 2011-07-16 NOTE — Telephone Encounter (Signed)
[id number] x1 

## 2011-07-16 NOTE — Telephone Encounter (Signed)
Heather given verbal for commode and home health aide. Carron Curie, CMA

## 2011-08-07 ENCOUNTER — Telehealth: Payer: Self-pay | Admitting: Pulmonary Disease

## 2011-08-07 MED ORDER — SERTRALINE HCL 100 MG PO TABS: ORAL_TABLET | ORAL | Status: DC

## 2011-08-07 NOTE — Telephone Encounter (Signed)
Spoke with pt's daughter Amor. She states that pt has become more depressed and has had loss of appetite since she has been in a nursing home. She states that Dr Jarold Motto had increased her zoloft to 150 mg daily and this seems to help. Now needs new rx called in and can not reach Dr Jarold Motto after several attempts. SN has originally prescribed zoloft at 100 mg. Please advise if okay to increase this thanks!

## 2011-08-07 NOTE — Telephone Encounter (Signed)
Per TP-okay to RX 150mg  #30 with 5 refills.

## 2011-08-07 NOTE — Telephone Encounter (Signed)
Pt daughter aware rx has been sent. °

## 2011-08-07 NOTE — Telephone Encounter (Signed)
PT'S DAUGHTER SAYS SHE LEFT A MSG. WITH A MAN YESTERDAY AFTERNOON RE: HER MOM'S ZOLOFT RX- THIS STILL HAS NOT BEEN CALLED IN. ( OUR OFFICE WAS CLOSED- DON'T KNOW WHO SHE SPOKE TO). DAUGHTER SAYS THAT PT NEEDS A RX FOR ZOLOFT 150mg . SAYS THIS HAS PREVIOUSLY BEEN PRESCRIBED BY DR. DANIEL PATTERSON (WHILE PT WAS AT NURSING HOME[facility]REHAB). SINCE UNABLE TO REACH DR PATTERSON- [facility] [address] [facility] [address] [facility] ADVISED HER TO CALL PCP FOR THIS RX. NOTE: SN HAD PREVIOUSLY PRESCRIBED 100mg - DR. PATTERSON "BUMPED THIS UP TO 150mg ". DAUGHTER SAYS PT NEEDS THE 150mg  "UNTIL PT IS ON HER FEET AGAIN".

## 2011-08-26 ENCOUNTER — Ambulatory Visit: Payer: Medicare Other | Admitting: Pulmonary Disease

## 2011-08-27 ENCOUNTER — Other Ambulatory Visit: Payer: Self-pay | Admitting: Pulmonary Disease

## 2011-08-28 ENCOUNTER — Telehealth: Payer: Self-pay | Admitting: Pulmonary Disease

## 2011-08-28 MED ORDER — DICYCLOMINE HCL 20 MG PO TABS: ORAL_TABLET | ORAL | Status: DC

## 2011-08-28 NOTE — Telephone Encounter (Signed)
Refill has been sent to the pharmacy per pts request.  Called and spoke with pt and she is aware of refill sent to the pharmacy

## 2011-09-02 ENCOUNTER — Telehealth: Payer: Self-pay | Admitting: Pulmonary Disease

## 2011-09-02 NOTE — Telephone Encounter (Signed)
PA initiated for Dicyclomine through [facility] at [phone], or we can use [phone]. Member ID # [id number]. Awaiting fax.

## 2011-09-02 NOTE — Telephone Encounter (Signed)
Pt was started on Bentyl 20 mg on 08/27/2009. She had previously been on Librax. PA form received and placed on SN cart for review and signature. Will forward msg to Marliss Czar so she may follow status.

## 2011-09-03 MED ORDER — DICYCLOMINE HCL 20 MG PO TABS: ORAL_TABLET | ORAL | Status: DC

## 2011-09-03 NOTE — Telephone Encounter (Signed)
Approval rececived for dicyclomine hcl   Approved through 12-07-2021.  Form has been placed in scan folder.

## 2011-09-16 ENCOUNTER — Other Ambulatory Visit: Payer: Self-pay | Admitting: *Deleted

## 2011-09-16 MED ORDER — CHLORDIAZEPOXIDE HCL 10 MG PO CAPS: 10.0000 mg | ORAL_CAPSULE | Freq: Three times a day (TID) | ORAL | Status: DC | PRN

## 2011-10-07 ENCOUNTER — Encounter: Payer: Self-pay | Admitting: Pulmonary Disease

## 2011-10-07 ENCOUNTER — Ambulatory Visit (INDEPENDENT_AMBULATORY_CARE_PROVIDER_SITE_OTHER): Payer: Medicare Other | Admitting: Pulmonary Disease

## 2011-10-07 DIAGNOSIS — C3490 Malignant neoplasm of unspecified part of unspecified bronchus or lung: Secondary | ICD-10-CM

## 2011-10-07 DIAGNOSIS — H9190 Unspecified hearing loss, unspecified ear: Secondary | ICD-10-CM

## 2011-10-07 DIAGNOSIS — K589 Irritable bowel syndrome without diarrhea: Secondary | ICD-10-CM

## 2011-10-07 DIAGNOSIS — L408 Other psoriasis: Secondary | ICD-10-CM

## 2011-10-07 DIAGNOSIS — J449 Chronic obstructive pulmonary disease, unspecified: Secondary | ICD-10-CM

## 2011-10-07 DIAGNOSIS — M1990 Unspecified osteoarthritis, unspecified site: Secondary | ICD-10-CM

## 2011-10-07 DIAGNOSIS — F411 Generalized anxiety disorder: Secondary | ICD-10-CM

## 2011-10-07 DIAGNOSIS — G589 Mononeuropathy, unspecified: Secondary | ICD-10-CM

## 2011-10-07 DIAGNOSIS — K219 Gastro-esophageal reflux disease without esophagitis: Secondary | ICD-10-CM

## 2011-10-07 DIAGNOSIS — M810 Age-related osteoporosis without current pathological fracture: Secondary | ICD-10-CM

## 2011-10-07 DIAGNOSIS — Z23 Encounter for immunization: Secondary | ICD-10-CM

## 2011-10-07 DIAGNOSIS — C50919 Malignant neoplasm of unspecified site of unspecified female breast: Secondary | ICD-10-CM

## 2011-10-07 MED ORDER — IBANDRONATE SODIUM 150 MG PO TABS: 150.0000 mg | ORAL_TABLET | ORAL | Status: DC

## 2011-10-07 NOTE — Patient Instructions (Signed)
Today we updated your med list in our EPIC system...    Continue your current medications the same...  We reviewed your June 2012 hospital XRay & lab data...  Please be careful at home:  NO FALLING ALLOWED!!!  Continue your nutritional supplements trying to gain a few lbs...  Call for any questions...  Let's plan a follow up visit in 6 months, sooner if needed for problems.Marland KitchenMarland Kitchen

## 2011-10-20 ENCOUNTER — Encounter: Payer: Self-pay | Admitting: Pulmonary Disease

## 2011-10-20 NOTE — Progress Notes (Signed)
Subjective:    Patient ID: Leslie [name], female    DOB: 1929/07/22, 77 y.o.   MRN: [id number]  HPI 77 y/o WF here for a 6 month follow up visit... she has mult medical problems as listed...   ~  Sep10:  she has been stable (confirmed by her daughter), no new complaints or concerns... she is due for f/u CXR (stable- NAD,no recurrence), lab work (all OK), and BMD (severe osteoporosis but sl improved)...  ~  Mar11:  she remains stable w/o new complaints or concerns, requesting refill of all meds for 2011... prev BMD showed sl improvement in TScores on the Boniva, Calcium, Vit D... ~  Sep11:  she saw TP 8/11 w/ c/o weight loss down to 78# & daugh wonders if it might not have been from depression- better now, appetite improved, & wt up 3# to 81# on Ensure supplements ("I just wasn't eating" she says)... offered Megace but she declines stating her appetite is OK & she will continue the Ensure + rec for Women's MVI, Vit B12 & Vit D 1000u daily... CXR 8/11- chr changes, NAD & labs all looked reasonable> reviewed w/ pt & daughter...  ~  March 16, 20012:  55mo ROV doing well w/o new complaints or concerns... she is off the MTX for her psoriasis & using Clobetasol cream... stable on her Advair/ Nebs... GI is stable on her PPI & Benty... Ortho stable on the Boniva,calcium, vits, Vit D & Tramadol... she requests refill prescriptions for 30d supplies...  ~  October 07, 2011:  46mo ROV & she has mult somatic complaints: dizzy in the AM (she refuses Neuro consult), pruritic rash on legs, poor appetite, weight stable ~80#, etc;  She requests flu shot & handicap sticker signed today...  See prob list below:    She fell at home 6/12 & was [facility] w/ fx right humerus, right patella, (seen by DrDuda for Ortho) & required 29mo in the NH for rehab [facility] records reviewed today); now improved back to baseline...  meds reviewed.          Problem List:  HEARING LOSS (ICD-389.9) - she refuses hearing eval...  COPD  (ICD-496) - ex-smoker... on ALBUT NEBS up to Qid, ADVAIR 250Bid, & Prn Mucinex... ~  CTChest 6/06 w/ stable post-op changes on left, no recurrence, scarring RLL, sm gallstones... ~  CXR 8/08 w/ vol loss & post-op changes on left, right clear...  ~  CXR 9/09 showed stable post op changes on the left, NAD.Marland Kitchen. ~  CXR 9/10 unchanged- stable post op appearance of left chest, NAD.Marland Kitchen. ~  CXR 8/11 showed post op changes & scarring, COPD, NAD.Marland Kitchen. ~  CXR 6/12 in [place] showed chr changes w/ scarring & blunt angle on left, hyperinflation on right, diffuse osteopenia, NAD...  Hx of CARCINOMA, LUNG, SQUAMOUS CELL (ICD-162.9) - s/p left thoracotomy w/ LLLobectomy for squamous cell ca 10/99 by DrBurney... no known recurrence.  GERD (ICD-530.81) - prev on Prevacid OTC... last EGD was 11/99 showing GERD, otherw neg... 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  ** note: she states the Prevacid really helps and the generic Omeprazole didn't help...  IBS (ICD-564.1) - on BENTYL 20mg  prn...last colonoscopy was 7/03 by DrPerry & was normal- no pathologic findings...  GALLSTONES (ICD-574.20)  Hx of ALCOHOLISM (ICD-303.90)  Hx of ADENOCARCINOMA, BREAST (ICD-174.9) - DrMagrinat stopped her Tamoxifen after 109yrs and released her on 12/08... she had  a left breast lumpectomy and sentinel node biopsy 12/03 by [facility] for a 2.2cm infiltrating carcinoma, neg LN's, ER/PR pos, treated w/ XRT, then Tamoxifen for 57yrs... ~  Mammogram 5/09 was negative... ~  Mammogram 5/10 at [facility] was neg- fatty replacement, post-op changes...  DEGENERATIVE JOINT DISEASE (ICD-715.90) - she's had a prev lumbar laminectomy & a fractured left hip after a fall (repaired by DrDuda [id number])... ~  [facility] CTNeck 6/12 after fall showed severe DDD & facet dis, and anterolisthesis at several levels... ~  Fall at home 6/12 w/ fx right humerus & fx right patella> treated by DrDuda  in hosp & NH rehab...  OSTEOPOROSIS (ICD-733.00) - she is on BONIVA 150mg /month along w/ calcium and vitD supplements... ~  BMD 8/08 shows severe osteoporosis w/ TScores -3.5 in the spine and -3.7 in the hip...  improved from 5/06 study! ~  Vit D level 3/09 = 30 & 50K/wk Rx started...  ~  Vit D level 9/09 = 66 & switched to 1000u OTC daily... ~  BMD 9/10 showed TScores -3.3 spine, and -3.1 in right fem neck... sl improved from 2008, continue Rx, avoid trauma. ~  Labs in [facility] 6/12 included Vit D level = 68                        NEUROPATHY (ICD-355.9) - on NEURONTIN 300mg  Tid... it really helps her back pain.  Early Dementia >> ~  CT Brain 6/12 in ER after fall showed atrophy & chr sm vessel dis, NAD...  ANXIETY (ICD-300.00) - on LIBRIUM 10mg tid, ZOLOFT 100mg /d and REMERON 15mg Qhs... she wishes to continue all of these the same.  PSORIASIS (ICD-696.1) -  treated by Elnora Morrison w/ MTX- intol pills, now on shots...  ANEMIA (ICD-285.9) - she is rec to take Women's MVI, Vit B12 1065mcg/d, Vit D 1000 u/d... ~  labs 3/09 showed Hg= 11.9 w/ Fe= 67... started Feosol 200mg /d... ~  labs 9/09 showed Hg= 13.3 w/ Fe= 84 ~  labs 9/10 showed Hg= 13.3, Fe= 111... pt stopped the Fe supplement. ~  labs 8/11 showed Hg= 12.9, Fe= 94 (23%sat), B12= 302 ~  Labs 6/12 in [place] showed Hg= 12.9==>10.4  HEALTH MAINTENANCE:  She takes a lot of Vitamins> B12, D, E, MVI, calcium etc... Plus nutritional supplements- Ensure, Boost, etc...   Past Surgical History  Procedure Date  . Lumbar laminectomy   . 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 Left lung surgery with lllobectomy for squamous cell cancer 09/1998    Dr. Edwyna Shell  . Left breast lumpectomy with sentinel lymph node biopsy   . Left femur fracture repaired 04/2004    Dr. Lajoyce Corners    Outpatient Encounter Prescriptions as of 10/07/2011  Medication Sig Dispense Refill  . ADVAIR DISKUS 250-50 MCG/DOSE AEPB INHALE 1 PUFF TWICE A DAY  1  each  6  . albuterol (PROVENTIL) (2.5 MG/3ML) 0.083% nebulizer solution Use 1 vial in nebulizer up to 3 times daily as needed FILE UNDER MCR PART B  90 vial  3  . calcium carbonate (OS-CAL) 600 MG TABS Take 600 mg by mouth 2 (two) times daily with a meal.        . chlordiazePOXIDE (LIBRIUM) 10 MG capsule Take 1 capsule (10 mg total) by mouth 3 (three) times daily as needed for anxiety.  90 capsule  5  . cholecalciferol (VITAMIN D) 1000 UNITS tablet Take 1,000 Units by mouth daily.        Marland Kitchen  cyanocobalamin 2000 MCG tablet Take 2,000 mcg by mouth daily.        Marland Kitchen dicyclomine (BENTYL) 20 MG tablet Take one tablet by mouth 3 times daily as needed for cramping  100 tablet  6  . gabapentin (NEURONTIN) 300 MG capsule Take 300 mg by mouth 3 (three) times daily.        . mirtazapine (REMERON) 15 MG tablet TAKE 1 TABLET EVERY DAY  30 tablet  12  . Multiple Vitamins-Minerals (CENTRUM SILVER PO) Take 1 tablet by mouth daily.        . sertraline (ZOLOFT) 100 MG tablet Take 1  tablets daily       . traMADol (ULTRAM) 50 MG tablet TAKE 1 TABLET BY MOUTH THREE TIMES A DAY AS NEEDED FOR PAIN  90 tablet  5  . triamcinolone (KENALOG) 0.025 % cream Apply 1 application topically 2 (two) times daily.        . vitamin E 400 UNIT capsule Take 400 Units by mouth daily.        Marland Kitchen ibandronate (BONIVA) 150 MG tablet Take 1 tablet (150 mg total) by mouth every 30 (thirty) days. Take in the morning with a full glass of water, on an empty stomach, and do not take anything else by mouth or lie down for the next 30 min.  1 tablet  11    No Known Allergies   Current Medications, Allergies, Past Medical History, Past Surgical History, Family History, and Social History were reviewed in [employer] record.    Review of Systems        See HPI - all other systems neg except as noted...  The patient complains of dyspnea on exertion and muscle weakness.  The patient denies anorexia, fever, weight loss, weight  gain, vision loss, decreased hearing, hoarseness, chest pain, syncope, peripheral edema, prolonged cough, headaches, hemoptysis, abdominal pain, melena, hematochezia, severe indigestion/heartburn, hematuria, incontinence, suspicious skin lesions, transient blindness, difficulty walking, depression, unusual weight change, abnormal bleeding, enlarged lymph nodes, and angioedema.     Objective:   Physical Exam    WD, Thin, 77 y/o WF in NAD... she is chr ill apearing... GENERAL:  Alert & oriented; pleasant & cooperative... HEENT:  [place]/AT, EOM-wnl, PERRLA, EACs-clear, TMs-wnl, NOSE-clear, THROAT-clear & wnl. NECK:  Supple w/ fairROM; no JVD; normal carotid impulses w/o bruits; no thyromegaly or nodules palpated; no lymphadenopathy. CHEST:  Clear to P & A; prev left thoracotomy scar; without wheezes/ rales/ or rhonchi heard... HEART:  Regular Rhythm; without murmurs/ rubs/ or gallops detected... ABDOMEN:  Soft & nontender; normal bowel sounds; no organomegaly or masses palpated... EXT: without deformities, mild arthritic changes; no varicose veins/ venous insuffic/ or edema. NEURO:  CN's intact; no focal neuro deficits x mild neuropathy... DERM:  No lesions noted; no rash etc...  RADIOLOGY DATA:  Reviewed in the EPIC EMR & discussed w/ the patient...  LABORATORY DATA:  Reviewed in the EPIC EMR & discussed w/ the patient...   Assessment & Plan:   COPD>  On NEBS, Advair, Mucinex; continue same & cautiously incr activity...  Hx Lung Cancer>  S/p LLLobectomy 1999 & no known recurrence; CXR 6/12 in [place] reviewed & chr changes, NAD...  GERD>  On OTC PPI Rx as needed; she denies swallowing difficulty etc, just poor appetite; rec supplements...  IBS>  On Bentyl as needed...  Hx Breast Cancer>  Left breast ca w/ surg 2003, then Tamoxifen & released by DrMagrinat in 2008; no known recurrence to  date...  DJD>  Fall at home 6/12 w/ Fx rt arm & patella; attended by DrDuda & improved toward baseline;  she uses Tylenol/ Tramadol for pain...  Osteoporosis>  On Boniva + Calcium, MVI, Vit D, etc...  Neuro> Dementia, Neuropathy>  On Neurontin, we reviewed CTBrain from 6/12 [facility]...  Anxiety>  She remains quite anxious & well cared for by her daughter; continue same meds...  Anemia>  Last Hg in Hosp was 10.4.Marland KitchenMarland Kitchen

## 2012-01-16 ENCOUNTER — Other Ambulatory Visit: Payer: Self-pay | Admitting: Pulmonary Disease

## 2012-02-24 ENCOUNTER — Other Ambulatory Visit: Payer: Self-pay | Admitting: Pulmonary Disease

## 2012-02-24 MED ORDER — SERTRALINE HCL 100 MG PO TABS: 100.0000 mg | ORAL_TABLET | Freq: Every day | ORAL | Status: DC

## 2012-02-24 NOTE — Telephone Encounter (Signed)
[facility] [address] [facility] rx for zoloft 100 mg take 1 1/2 tablet daily    In chart : zoloft 100mg  take 1 tablet daily.  Called pt...pt states she is only taking 1 tablet daily.has an appt with Dr [name] on 5- 3-13  sent rx to pharmacy and pt is aware.

## 2012-03-01 ENCOUNTER — Other Ambulatory Visit: Payer: Self-pay | Admitting: Pulmonary Disease

## 2012-03-02 ENCOUNTER — Telehealth: Payer: Self-pay | Admitting: Pulmonary Disease

## 2012-03-02 NOTE — Telephone Encounter (Signed)
Spoke with pt. She is asking for refill on librium. Last ov with SN 10-07-11 with ov pending in May 2013. Please advise, thanks!

## 2012-03-03 MED ORDER — CHLORDIAZEPOXIDE HCL 10 MG PO CAPS: 10.0000 mg | ORAL_CAPSULE | Freq: Three times a day (TID) | ORAL | Status: DC | PRN

## 2012-03-03 NOTE — Telephone Encounter (Signed)
rx has been sent to the pharmacy for the pt.  

## 2012-03-03 NOTE — Telephone Encounter (Signed)
Pt aware. Leslie [name], CMA  

## 2012-03-25 ENCOUNTER — Other Ambulatory Visit: Payer: Self-pay | Admitting: Pulmonary Disease

## 2012-03-27 ENCOUNTER — Other Ambulatory Visit: Payer: Self-pay | Admitting: Pulmonary Disease

## 2012-03-28 ENCOUNTER — Other Ambulatory Visit: Payer: Self-pay | Admitting: Pulmonary Disease

## 2012-03-29 ENCOUNTER — Other Ambulatory Visit: Payer: Self-pay | Admitting: Pulmonary Disease

## 2012-04-09 ENCOUNTER — Ambulatory Visit (INDEPENDENT_AMBULATORY_CARE_PROVIDER_SITE_OTHER): Payer: Medicare Other | Admitting: Pulmonary Disease

## 2012-04-09 ENCOUNTER — Other Ambulatory Visit (INDEPENDENT_AMBULATORY_CARE_PROVIDER_SITE_OTHER): Payer: Medicare Other

## 2012-04-09 ENCOUNTER — Encounter: Payer: Self-pay | Admitting: Pulmonary Disease

## 2012-04-09 ENCOUNTER — Ambulatory Visit (INDEPENDENT_AMBULATORY_CARE_PROVIDER_SITE_OTHER)
Admission: RE | Admit: 2012-04-09 | Discharge: 2012-04-09 | Disposition: A | Discharge status: Home / Self Care | Payer: Medicare Other | Source: Ambulatory Visit | Attending: Pulmonary Disease | Admitting: Pulmonary Disease

## 2012-04-09 VITALS — BP 124/70 | HR 62 | Temp 97.0°F | Ht 60.0 in | Wt 82.6 lb

## 2012-04-09 DIAGNOSIS — K589 Irritable bowel syndrome without diarrhea: Secondary | ICD-10-CM

## 2012-04-09 DIAGNOSIS — J449 Chronic obstructive pulmonary disease, unspecified: Secondary | ICD-10-CM

## 2012-04-09 DIAGNOSIS — M810 Age-related osteoporosis without current pathological fracture: Secondary | ICD-10-CM

## 2012-04-09 DIAGNOSIS — C50919 Malignant neoplasm of unspecified site of unspecified female breast: Secondary | ICD-10-CM

## 2012-04-09 DIAGNOSIS — F411 Generalized anxiety disorder: Secondary | ICD-10-CM

## 2012-04-09 DIAGNOSIS — G589 Mononeuropathy, unspecified: Secondary | ICD-10-CM

## 2012-04-09 DIAGNOSIS — M1990 Unspecified osteoarthritis, unspecified site: Secondary | ICD-10-CM

## 2012-04-09 DIAGNOSIS — K219 Gastro-esophageal reflux disease without esophagitis: Secondary | ICD-10-CM

## 2012-04-09 DIAGNOSIS — E538 Deficiency of other specified B group vitamins: Secondary | ICD-10-CM

## 2012-04-09 DIAGNOSIS — C3490 Malignant neoplasm of unspecified part of unspecified bronchus or lung: Secondary | ICD-10-CM

## 2012-04-09 LAB — CBC WITH DIFFERENTIAL/PLATELET
Basophils Absolute: 0.1 10*3/uL (ref 0.0–0.1)
HCT: 41.2 % (ref 36.0–46.0)
Hemoglobin: 13.3 g/dL (ref 12.0–15.0)
Lymphs Abs: 2.7 10*3/uL (ref 0.7–4.0)
MCHC: 32.2 g/dL (ref 30.0–36.0)
MCV: 90 fl (ref 78.0–100.0)
Monocytes Absolute: 0.5 10*3/uL (ref 0.1–1.0)
Neutro Abs: 6.4 10*3/uL (ref 1.4–7.7)
Platelets: 248 10*3/uL (ref 150.0–400.0)
RDW: 15.5 % — ABNORMAL HIGH (ref 11.5–14.6)

## 2012-04-09 LAB — HEPATIC FUNCTION PANEL
AST: 27 U/L (ref 0–37)
Alkaline Phosphatase: 87 U/L (ref 39–117)
Bilirubin, Direct: 0.1 mg/dL (ref 0.0–0.3)
Total Bilirubin: 0.5 mg/dL (ref 0.3–1.2)

## 2012-04-09 LAB — BASIC METABOLIC PANEL
GFR: 80.19 mL/min (ref >60.00)
Glucose, Bld: 83 mg/dL (ref 70–99)
Potassium: 3.9 mEq/L (ref 3.5–5.1)
Sodium: 140 mEq/L (ref 135–145)

## 2012-04-09 LAB — TSH: TSH: 2.45 u[IU]/mL (ref 0.35–5.50)

## 2012-04-09 NOTE — Progress Notes (Signed)
Subjective:    Patient ID: Leslie [name], female    DOB: 1929-08-25, 78 y.o.   MRN: [id number]  HPI 78 y/o WF here for a 6 month follow up visit... she has mult medical problems as listed...   ~  March 16, 20012:  49mo ROV doing well w/o new complaints or concerns... she is off the MTX for her psoriasis & using Clobetasol cream... stable on her Advair/ Nebs... GI is stable on her PPI & Benty... Ortho stable on the Boniva,calcium, vits, Vit D & Tramadol... she requests refill prescriptions for 30d supplies...  ~  October 07, 2011:  24mo ROV & she has mult somatic complaints: dizzy in the AM (she refuses Neuro consult), pruritic rash on legs, poor appetite, weight stable ~80#, etc;  She requests flu shot & handicap sticker signed today...  See prob list below:    She fell at home 6/12 & was [facility] w/ fx right humerus, right patella, (seen by DrDuda for Ortho) & required 52mo in the NH for rehab [facility] records reviewed today); now improved back to baseline...  meds reviewed.  ~  Apr 09, 2012:  49mo ROV & she is c/o feeling bad, more SOB, all due to the pollen she thinks; on NEBS, Advair, & Albut HFA rescue inhaler; reminded to use NEBS 2-4x daily & the Advair Bid every day (the rescue inhaler is just for prn use when out & about); asked to get air filter at home to decr pollen exposure...    She has DJD & LBP w/ prev surg; she fell 6/12 w/ fx right humerus & patella- Rx DrDuda; she has been on Boniva for her severe Osteoporosis & wants to stop this & switch to IV med- we will see if she is elig & cost of RECLAST for her...  CXR 5/13 showed stable post-op changes/ vol loss/ scarring on left, atherosclerotic calcif in Ao, COPD changes, NAD... LABS 5/13:  Chems- wnl;  CBC- wnl;  TSH=2.45;  VitD=61;  VitB12>1500...   Problem List:    << PROBLEM LIST UPDATED 04/09/12 >>  HEARING LOSS (ICD-389.9) - she refuses hearing eval...  COPD (ICD-496) - ex-smoker... on ALBUT NEBS up to Qid, ADVAIR 250Bid, & Prn  Mucinex... ~  CTChest 6/06 w/ stable post-op changes on left, no recurrence, scarring RLL, sm gallstones... ~  CXR 8/08 w/ vol loss & post-op changes on left, right clear...  ~  CXR 9/09 showed stable post op changes on the left, NAD.Marland Kitchen. ~  CXR 9/10 unchanged- stable post op appearance of left chest, NAD.Marland Kitchen. ~  CXR 8/11 showed post op changes & scarring, COPD, NAD.Marland Kitchen. ~  CXR 6/12 in [place] showed chr changes w/ scarring & blunt angle on left, hyperinflation on right, diffuse osteopenia, NAD.Marland Kitchen. ~  CXR 5/13 showed stable post-op changes/ vol loss/ scarring on left, atherosclerotic calcif in Ao, COPD changes, NAD...  Hx of CARCINOMA, LUNG, SQUAMOUS CELL (ICD-162.9) - s/p left thoracotomy w/ LLLobectomy for squamous cell ca 10/99 by DrBurney... no known recurrence.  GERD (ICD-530.81) - prev on Prevacid OTC... last EGD was 11/99 showing GERD, otherw neg... 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  ** note: she states the Prevacid really helps and the generic Omeprazole didn't help...  IBS (ICD-564.1) - on BENTYL 20mg  prn...last colonoscopy was 7/03 by DrPerry & was normal- no pathologic findings...  GALLSTONES (ICD-574.20)  Hx of ALCOHOLISM (ICD-303.90)  Hx of ADENOCARCINOMA,  BREAST (ICD-174.9) - DrMagrinat stopped her Tamoxifen after 50yrs and released her on 12/08... she had a left breast lumpectomy and sentinel node biopsy 12/03 by [facility] for a 2.2cm infiltrating carcinoma, neg LN's, ER/PR pos, treated w/ XRT, then Tamoxifen for 32yrs... ~  Mammogram 5/09 was negative... ~  Mammogram 5/10 at [facility] was neg- fatty replacement, post-op changes...  DEGENERATIVE JOINT DISEASE (ICD-715.90) - she's had a prev lumbar laminectomy & a fractured left hip after a fall (repaired by DrDuda [id number])... ~  [facility] CT[facility]ck 6/12 after fall showed severe DDD & facet dis, and anterolisthesis at several levels... ~  Fall at home 6/12 w/ fx  right humerus & fx right patella> treated by DrDuda in hosp & NH rehab...  OSTEOPOROSIS (ICD-733.00) - she is on BONIVA 150mg /month along w/ calcium and vitD supplements... ~  BMD 8/08 shows severe osteoporosis w/ TScores -3.5 in the spine and -3.7 in the hip...  improved from 5/06 study! ~  Vit D level 3/09 = 30 & 50K/wk Rx started...  ~  Vit D level 9/09 = 66 & switched to 1000u OTC daily... ~  BMD 9/10 showed TScores -3.3 spine, and -3.1 in right fem neck... sl improved from 2008, continue Rx, avoid trauma. ~  Labs in [facility] 6/12 included Vit D level = 68 ~  Labs 5/13 showed VitD level = 61 ~  5/13:  She does not want to take Boniva any longer & is asking for IV med alternative> we will look into RECLAST eligibility for her...                        NEUROPATHY (ICD-355.9) - on NEURONTIN 300mg  Tid... it really helps her back pain.  Early Dementia >> ~  CT Brain 6/12 in ER after fall showed atrophy & chr sm vessel dis, NAD...  ANXIETY (ICD-300.00) - on LIBRIUM 10mg tid, ZOLOFT 100mg /d and REMERON 15mg Qhs... she wishes to continue all of these the same.  PSORIASIS (ICD-696.1) -  treated by Elnora Morrison w/ MTX- intol pills, now on shots...  ANEMIA (ICD-285.9) - she is rec to take Women's MVI, Vit B12 108mcg/d, Vit D 1000 u/d... ~  labs 3/09 showed Hg= 11.9 w/ Fe= 67... started Feosol 200mg /d... ~  labs 9/09 showed Hg= 13.3 w/ Fe= 84 ~  labs 9/10 showed Hg= 13.3, Fe= 111... pt stopped the Fe supplement. ~  labs 8/11 showed Hg= 12.9, Fe= 94 (23%sat), B12= 302 ~  Labs 6/12 in [place] showed Hg= 12.9==>10.4 ~  Labs 5/13 showed Hg= 13.3  HEALTH MAINTENANCE:  She takes a lot of Vitamins> B12, D, E, MVI, calcium etc... Plus nutritional supplements- Ensure, Boost, etc...   Past Surgical History  Procedure Date  . Lumbar laminectomy   . 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 Left lung surgery with lllobectomy for squamous cell cancer 09/1998    Dr. Edwyna Shell  . Left  breast lumpectomy with sentinel lymph node biopsy   . Left femur fracture repaired 04/2004    Dr. Lajoyce Corners    Outpatient Encounter Prescriptions as of 04/09/2012  Medication Sig Dispense Refill  . ADVAIR DISKUS 250-50 MCG/DOSE AEPB INHALE 1 PUFF TWICE A DAY  60 each  6  . albuterol (PROVENTIL) (2.5 MG/3ML) 0.083% nebulizer solution Use 1 vial in nebulizer up to 3 times daily as needed FILE UNDER MCR PART B  90 vial  3  . calcium carbonate (OS-CAL) 600 MG TABS Take 600 mg by  mouth 2 (two) times daily with a meal.        . chlordiazePOXIDE (LIBRIUM) 10 MG capsule Take 1 capsule (10 mg total) by mouth 3 (three) times daily as needed for anxiety.  90 capsule  5  . cholecalciferol (VITAMIN D) 1000 UNITS tablet Take 1,000 Units by mouth daily.        . cyanocobalamin 2000 MCG tablet Take 2,000 mcg by mouth daily.        Marland Kitchen dicyclomine (BENTYL) 20 MG tablet Take one tablet by mouth 3 times daily as needed for cramping  100 tablet  6  . gabapentin (NEURONTIN) 300 MG capsule TAKE ONE CAPSULE BY MOUTH 3 TIMES A DAY  90 capsule  5  . ibandronate (BONIVA) 150 MG tablet Take 1 tablet (150 mg total) by mouth every 30 (thirty) days. Take in the morning with a full glass of water, on an empty stomach, and do not take anything else by mouth or lie down for the next 30 min.  1 tablet  11  . mirtazapine (REMERON) 15 MG tablet TAKE 1 TABLET EVERY DAY  30 tablet  10  . Multiple Vitamins-Minerals (CENTRUM SILVER PO) Take 1 tablet by mouth daily.        . sertraline (ZOLOFT) 100 MG tablet Take 1 tablet (100 mg total) by mouth daily. Take 1  tablets daily  30 tablet  3  . traMADol (ULTRAM) 50 MG tablet TAKE 1 TABLET BY MOUTH 3 TIMES A DAY AS NEEDED FOR PAIN  90 tablet  8  . triamcinolone (KENALOG) 0.025 % cream Apply 1 application topically 2 (two) times daily.        . vitamin E 400 UNIT capsule Take 400 Units by mouth daily.        . mirtazapine (REMERON SOL-TAB) 15 MG disintegrating tablet Take 1 tablet (15 mg total) by  mouth at bedtime.  30 tablet  5  . DISCONTD: dicyclomine (BENTYL) 20 MG tablet TAKE 1 TABLET BY MOUTH 3 TIMES A DAY AS NEEDED FOR CRAMPING  100 tablet  2  . DISCONTD: metFORMIN (GLUCOPHAGE) 500 MG tablet TAKE 2 TABLETS BY MOUTH TWICE A DAY  120 tablet  5    No Known Allergies   Current Medications, Allergies, Past Medical History, Past Surgical History, Family History, and Social History were reviewed in [employer] record.    Review of Systems        See HPI - all other systems neg except as noted...  The patient complains of dyspnea on exertion and muscle weakness.  The patient denies anorexia, fever, weight loss, weight gain, vision loss, decreased hearing, hoarseness, chest pain, syncope, peripheral edema, prolonged cough, headaches, hemoptysis, abdominal pain, melena, hematochezia, severe indigestion/heartburn, hematuria, incontinence, suspicious skin lesions, transient blindness, difficulty walking, depression, unusual weight change, abnormal bleeding, enlarged lymph nodes, and angioedema.     Objective:   Physical Exam    WD, Thin, 78 y/o WF in NAD... she is chr ill apearing... GENERAL:  Alert & oriented; pleasant & cooperative... HEENT:  [place]/AT, EOM-wnl, PERRLA, EACs-clear, TMs-wnl, NOSE-clear, THROAT-clear & wnl. NECK:  Supple w/ fairROM; no JVD; normal carotid impulses w/o bruits; no thyromegaly or nodules palpated; no lymphadenopathy. CHEST:  Clear to P & A; prev left thoracotomy scar; without wheezes/ rales/ or rhonchi heard... HEART:  Regular Rhythm; without murmurs/ rubs/ or gallops detected... ABDOMEN:  Soft & nontender; normal bowel sounds; no organomegaly or masses palpated... EXT: without deformities, mild arthritic changes;  no varicose veins/ venous insuffic/ or edema. NEURO:  CN's intact; no focal neuro deficits x mild neuropathy... DERM:  No lesions noted; no rash etc...  RADIOLOGY DATA:  Reviewed in the EPIC EMR & discussed w/ the  patient...  LABORATORY DATA:  Reviewed in the EPIC EMR & discussed w/ the patient...   Assessment & Plan:   COPD>  On NEBS, Advair, Mucinex; continue same & cautiously incr activity; avoid pollen & get air filter for bedroom...  Hx Lung Cancer>  S/p LLLobectomy 1999 & no known recurrence; CXR 5/13 reviewed & chr changes, NAD...  GERD>  On OTC PPI Rx as needed; she denies swallowing difficulty etc, just poor appetite; rec supplements...  IBS>  On Bentyl as needed...  Hx Breast Cancer>  Left breast ca w/ surg 2003, then Tamoxifen & released by DrMagrinat in 2008; no known recurrence to date...  DJD>  Fall at home 6/12 w/ Fx rt arm & patella; attended by DrDuda & improved toward baseline; she uses Tylenol/ Tramadol for pain...  Osteoporosis>  On Boniva + Calcium, MVI, Vit D, etc; she is asking for IV therapy & we will look into Reclast...  Neuro> Dementia, Neuropathy>  On Neurontin, we reviewed CTBrain from 6/12 [facility]...  Anxiety>  She remains quite anxious & well cared for by her daughter; continue same meds...  Anemia>  Hg now improved to 13.2.Marland KitchenMarland Kitchen    Patient's Medications  New Prescriptions   No medications on file  Previous Medications   ADVAIR DISKUS 250-50 MCG/DOSE AEPB    INHALE 1 PUFF TWICE A DAY   ALBUTEROL (PROVENTIL) (2.5 MG/3ML) 0.083% NEBULIZER SOLUTION    Use 1 vial in nebulizer up to 3 times daily as needed FILE UNDER MCR PART B   CALCIUM CARBONATE (OS-CAL) 600 MG TABS    Take 600 mg by mouth 2 (two) times daily with a meal.     CHLORDIAZEPOXIDE (LIBRIUM) 10 MG CAPSULE    Take 1 capsule (10 mg total) by mouth 3 (three) times daily as needed for anxiety.   CHOLECALCIFEROL (VITAMIN D) 1000 UNITS TABLET    Take 1,000 Units by mouth daily.     CYANOCOBALAMIN 2000 MCG TABLET    Take 2,000 mcg by mouth daily.     DICYCLOMINE (BENTYL) 20 MG TABLET    Take one tablet by mouth 3 times daily as needed for cramping   GABAPENTIN (NEURONTIN) 300 MG CAPSULE    TAKE ONE CAPSULE BY  MOUTH 3 TIMES A DAY   IBANDRONATE (BONIVA) 150 MG TABLET    Take 1 tablet (150 mg total) by mouth every 30 (thirty) days. Take in the morning with a full glass of water, on an empty stomach, and do not take anything else by mouth or lie down for the next 30 min.   MIRTAZAPINE (REMERON SOL-TAB) 15 MG DISINTEGRATING TABLET    Take 1 tablet (15 mg total) by mouth at bedtime.   MIRTAZAPINE (REMERON) 15 MG TABLET    TAKE 1 TABLET EVERY DAY   MULTIPLE VITAMINS-MINERALS (CENTRUM SILVER PO)    Take 1 tablet by mouth daily.     SERTRALINE (ZOLOFT) 100 MG TABLET    Take 1 tablet (100 mg total) by mouth daily. Take 1  tablets daily   TRAMADOL (ULTRAM) 50 MG TABLET    TAKE 1 TABLET BY MOUTH 3 TIMES A DAY AS NEEDED FOR PAIN   TRIAMCINOLONE (KENALOG) 0.025 % CREAM    Apply 1 application topically 2 (two) times  daily.     VITAMIN E 400 UNIT CAPSULE    Take 400 Units by mouth daily.    Modified Medications   No medications on file  Discontinued Medications   DICYCLOMINE (BENTYL) 20 MG TABLET    TAKE 1 TABLET BY MOUTH 3 TIMES A DAY AS NEEDED FOR CRAMPING   METFORMIN (GLUCOPHAGE) 500 MG TABLET  We can't figure out how thios got on her list but she is not diabetic & hasn't take Metformin.Marland KitchenMarland Kitchen

## 2012-04-09 NOTE — Patient Instructions (Signed)
Today we updated your med list in our EPIC system...    Continue your current medications the same...  Today we did your follow up CXR & blood work...    We will call you w/ the results when avail...  If everything is in order we will sched you for once yearly RECLAST infusion for your Osteoporosis...  Call for any questions...  Let's plan a follow up visit in 6months.Marland KitchenMarland Kitchen

## 2012-04-11 ENCOUNTER — Encounter: Payer: Self-pay | Admitting: Pulmonary Disease

## 2012-04-12 ENCOUNTER — Other Ambulatory Visit: Payer: Self-pay | Admitting: Pulmonary Disease

## 2012-04-12 ENCOUNTER — Encounter: Payer: Self-pay | Admitting: Adult Health

## 2012-04-12 DIAGNOSIS — M810 Age-related osteoporosis without current pathological fracture: Secondary | ICD-10-CM

## 2012-04-21 ENCOUNTER — Encounter (HOSPITAL_COMMUNITY): Payer: Medicare Other

## 2012-04-28 ENCOUNTER — Other Ambulatory Visit: Payer: Self-pay | Admitting: Pulmonary Disease

## 2012-04-30 ENCOUNTER — Inpatient Hospital Stay (HOSPITAL_COMMUNITY): Admission: RE | Admit: 2012-04-30 | Payer: Medicare Other | Source: Ambulatory Visit

## 2012-05-07 ENCOUNTER — Encounter (HOSPITAL_COMMUNITY): Payer: Self-pay

## 2012-05-07 ENCOUNTER — Encounter (HOSPITAL_COMMUNITY)
Admission: RE | Admit: 2012-05-07 | Discharge: 2012-05-07 | Disposition: A | Discharge status: Home / Self Care | Payer: Medicare Other | Source: Ambulatory Visit | Attending: Pulmonary Disease | Admitting: Pulmonary Disease

## 2012-05-07 DIAGNOSIS — M810 Age-related osteoporosis without current pathological fracture: Secondary | ICD-10-CM | POA: Insufficient documentation

## 2012-05-07 MED ORDER — ZOLEDRONIC ACID 5 MG/100ML IV SOLN
5.0000 mg | Freq: Once | INTRAVENOUS | Status: AC
  Administered 2012-05-07: 5 mg via INTRAVENOUS
  Filled 2012-05-07: qty 100

## 2012-05-07 MED ORDER — SODIUM CHLORIDE 0.9 % IV SOLN
Freq: Once | INTRAVENOUS | Status: AC
  Administered 2012-05-07: 11:00:00 via INTRAVENOUS

## 2012-05-07 NOTE — Discharge Instructions (Signed)
Drink plenty of water the next few days. Continue taking your Vitamin D and Calcium pills are ordered by your MD. If any aches or pains may take whatever you usually take for pain. If severe call your MD. If any other problems or questions call your MD.     Reclast Zoledronic Acid injection (Paget's Disease, Osteoporosis) What is this medicine? ZOLEDRONIC ACID (ZOE le dron ik AS id) lowers the amount of calcium loss from bone. It is used to treat Paget's disease and osteoporosis in women. This medicine may be used for other purposes; ask your health care provider or pharmacist if you have questions. What should I tell my health care provider before I take this medicine? They need to know if you have any of these conditions: -aspirin-sensitive asthma -dental disease -kidney disease -low levels of calcium in the blood -past surgery on the parathyroid gland or intestines -an unusual or allergic reaction to zoledronic acid, other medicines, foods, dyes, or preservatives -pregnant or trying to get pregnant -breast-feeding How should I use this medicine? This medicine is for infusion into a vein. It is given by a health care professional in a hospital or clinic setting. Talk to your pediatrician regarding the use of this medicine in children. This medicine is not approved for use in children. Overdosage: If you think you have taken too much of this medicine contact a poison control center or emergency room at once. NOTE: This medicine is only for you. Do not share this medicine with others. What if I miss a dose? It is important not to miss your dose. Call your doctor or health care professional if you are unable to keep an appointment. What may interact with this medicine? -certain antibiotics given by injection -NSAIDs, medicines for pain and inflammation, like ibuprofen or naproxen -some diuretics like bumetanide, furosemide -teriparatide This list may not describe all possible  interactions. Give your health care provider a list of all the medicines, herbs, non-prescription drugs, or dietary supplements you use. Also tell them if you smoke, drink alcohol, or use illegal drugs. Some items may interact with your medicine. What should I watch for while using this medicine? Visit your doctor or health care professional for regular checkups. It may be some time before you see the benefit from this medicine. Do not stop taking your medicine unless your doctor tells you to. Your doctor may order blood tests or other tests to see how you are doing. Women should inform their doctor if they wish to become pregnant or think they might be pregnant. There is a potential for serious side effects to an unborn child. Talk to your health care professional or pharmacist for more information. You should make sure that you get enough calcium and vitamin D while you are taking this medicine. Discuss the foods you eat and the vitamins you take with your health care professional. Some people who take this medicine have severe bone, joint, and/or muscle pain. This medicine may also increase your risk for a broken thigh bone. Tell your doctor right away if you have pain in your upper leg or groin. Tell your doctor if you have any pain that does not go away or that gets worse. What side effects may I notice from receiving this medicine? Side effects that you should report to your doctor or health care professional as soon as possible: -allergic reactions like skin rash, itching or hives, swelling of the face, lips, or tongue -breathing problems -changes in vision -feeling  faint or lightheaded, falls -jaw burning, cramping, or pain -muscle cramps, stiffness, or weakness -trouble passing urine or change in the amount of urine Side effects that usually do not require medical attention (report to your doctor or health care professional if they continue or are bothersome): -bone, joint, or muscle  pain -fever -irritation at site where injected -loss of appetite -nausea, vomiting -stomach upset -tired This list may not describe all possible side effects. Call your doctor for medical advice about side effects. You may report side effects to FDA at 1-800-FDA-1088. Where should I keep my medicine? This drug is given in a hospital or clinic and will not be stored at home. NOTE: This sheet is a summary. It may not cover all possible information. If you have questions about this medicine, talk to your doctor, pharmacist, or health care provider.  2012, [employer]. (05/23/2011 9:08:15 AM)

## 2012-06-18 ENCOUNTER — Other Ambulatory Visit: Payer: Self-pay | Admitting: Pulmonary Disease

## 2012-06-20 ENCOUNTER — Other Ambulatory Visit: Payer: Self-pay | Admitting: Pulmonary Disease

## 2012-06-21 ENCOUNTER — Telehealth: Payer: Self-pay | Admitting: Pulmonary Disease

## 2012-06-21 NOTE — Telephone Encounter (Signed)
Called, spoke with pt's daughter, [name], who states pt's c/o "I can't eat.  I'm weak" since Thursday.  At this time, [name] states pt also c/o burning sensation when urinating.  Over the weekend, she had pt drink cranberry juice and increase fluids.  Burning sensation with urination has now resolved, but pt still feeling weak, having a little bit of nausea, and eating very little.  [name] denies pt having vomiting or diarrhea.  [name] states pt does want to come in for OV tomorrow to see TP but pt does not feel that she can wait in the lobby for an hour.  [name] states if pt's symptoms worsen prior to this, she will take pt to the ED.  [name] is requesting for pt to be brought back to exam room asap, if possible, when they check in tomorrow d/t pt's weakness.  Advised I will send msg to Katheren Shams, TP's nurse, so she is aware of this situation.  [name] was very appreciative of this and voiced no further questions/concerns at this time.

## 2012-06-22 ENCOUNTER — Encounter: Payer: Self-pay | Admitting: Adult Health

## 2012-06-22 ENCOUNTER — Ambulatory Visit (INDEPENDENT_AMBULATORY_CARE_PROVIDER_SITE_OTHER): Payer: Medicare Other | Admitting: Adult Health

## 2012-06-22 ENCOUNTER — Other Ambulatory Visit (INDEPENDENT_AMBULATORY_CARE_PROVIDER_SITE_OTHER): Payer: Medicare Other

## 2012-06-22 VITALS — BP 118/64 | HR 81 | Temp 97.9°F | Ht 60.0 in | Wt 79.0 lb

## 2012-06-22 DIAGNOSIS — R300 Dysuria: Secondary | ICD-10-CM | POA: Insufficient documentation

## 2012-06-22 LAB — CBC WITH DIFFERENTIAL/PLATELET
Basophils Relative: 1.2 % (ref 0.0–3.0)
Eosinophils Absolute: 0.1 10*3/uL (ref 0.0–0.7)
Eosinophils Relative: 1 % (ref 0.0–5.0)
HCT: 41 % (ref 36.0–46.0)
Lymphs Abs: 2.3 10*3/uL (ref 0.7–4.0)
MCHC: 32.7 g/dL (ref 30.0–36.0)
MCV: 89 fl (ref 78.0–100.0)
Monocytes Absolute: 0.4 10*3/uL (ref 0.1–1.0)
Neutrophils Relative %: 72 % (ref 43.0–77.0)
Platelets: 231 10*3/uL (ref 150.0–400.0)

## 2012-06-22 LAB — BASIC METABOLIC PANEL
BUN: 19 mg/dL (ref 6–23)
Calcium: 9.3 mg/dL (ref 8.4–10.5)
GFR: 65.62 mL/min (ref >60.00)
Glucose, Bld: 107 mg/dL — ABNORMAL HIGH (ref 70–99)
Potassium: 5.6 mEq/L — ABNORMAL HIGH (ref 3.5–5.1)

## 2012-06-22 LAB — URINALYSIS, ROUTINE W REFLEX MICROSCOPIC
Bilirubin Urine: NEGATIVE
Hgb urine dipstick: NEGATIVE
Ketones, ur: NEGATIVE
Total Protein, Urine: NEGATIVE
Urine Glucose: NEGATIVE
Urobilinogen, UA: 0.2 (ref 0.0–1.0)

## 2012-06-22 MED ORDER — ONDANSETRON HCL 4 MG PO TABS: 4.0000 mg | ORAL_TABLET | Freq: Three times a day (TID) | ORAL | Status: DC | PRN

## 2012-06-22 NOTE — Assessment & Plan Note (Signed)
Intermittent dysuria and weakness ? UTI  Check labs w/ UA Cx stat  follow up on labs  zofran As needed

## 2012-06-22 NOTE — Addendum Note (Signed)
Addended by: Boone Master E on: 06/22/2012 04:24 PM   Modules accepted: Orders

## 2012-06-22 NOTE — Progress Notes (Signed)
Subjective:    Patient ID: Leslie [name], female    DOB: 1929/11/01, 78 y.o.   MRN: [id number]  HPI 78 y/o   she has mult medical problems as listed...   ~  March 16, 20012:  44mo ROV doing well w/o new complaints or concerns... she is off the MTX for her psoriasis & using Clobetasol cream... stable on her Advair/ Nebs... GI is stable on her PPI & Benty... Ortho stable on the Boniva,calcium, vits, Vit D & Tramadol... she requests refill prescriptions for 30d supplies...  ~  October 07, 2011:  67mo ROV & she has mult somatic complaints: dizzy in the AM (she refuses Neuro consult), pruritic rash on legs, poor appetite, weight stable ~80#, etc;  She requests flu shot & handicap sticker signed today...  See prob list below:    She fell at home 6/12 & was [facility] w/ fx right humerus, right patella, (seen by DrDuda for Ortho) & required 57mo in the NH for rehab [facility] records reviewed today); now improved back to baseline...  meds reviewed.  ~  Apr 09, 2012:  44mo ROV & she is c/o feeling bad, more SOB, all due to the pollen she thinks; on NEBS, Advair, & Albut HFA rescue inhaler; reminded to use NEBS 2-4x daily & the Advair Bid every day (the rescue inhaler is just for prn use when out & about); asked to get air filter at home to decr pollen exposure...    She has DJD & LBP w/ prev surg; she fell 6/12 w/ fx right humerus & patella- Rx DrDuda; she has been on Boniva for her severe Osteoporosis & wants to stop this & switch to IV med- we will see if she is elig & cost of RECLAST for her...  CXR 5/13 showed stable post-op changes/ vol loss/ scarring on left, atherosclerotic calcif in Ao, COPD changes, NAD... LABS 5/13:  Chems- wnl;  CBC- wnl;  TSH=2.45;  VitD=61;  VitB12>1500...  06/22/2012 Acute OV  Patient presents today for an acute office visit, accompanied by her daughter. Over the last 5 days has had intermittent dysuria, urgency, pubic pain, and decreased appetite, with nausea. Dysuria has resolved,  however, she continues to be weak. No energy, and low appetite. Denies any chest pain, orthopnea, PND, or leg swelling. No vomiting, diarrhea, or bloody stools. She denies any hematuria or fever  Problem List:    << PROBLEM LIST UPDATED 04/09/12 >>  HEARING LOSS (ICD-389.9) - she refuses hearing eval...  COPD (ICD-496) - ex-smoker... on ALBUT NEBS up to Qid, ADVAIR 250Bid, & Prn Mucinex... ~  CTChest 6/06 w/ stable post-op changes on left, no recurrence, scarring RLL, sm gallstones... ~  CXR 8/08 w/ vol loss & post-op changes on left, right clear...  ~  CXR 9/09 showed stable post op changes on the left, NAD.Marland Kitchen. ~  CXR 9/10 unchanged- stable post op appearance of left chest, NAD.Marland Kitchen. ~  CXR 8/11 showed post op changes & scarring, COPD, NAD.Marland Kitchen. ~  CXR 6/12 in [place] showed chr changes w/ scarring & blunt angle on left, hyperinflation on right, diffuse osteopenia, NAD.Marland Kitchen. ~  CXR 5/13 showed stable post-op changes/ vol loss/ scarring on left, atherosclerotic calcif in Ao, COPD changes, NAD...  Hx of CARCINOMA, LUNG, SQUAMOUS CELL (ICD-162.9) - s/p left thoracotomy w/ LLLobectomy for squamous cell ca 10/99 by DrBurney... no known recurrence.  GERD (ICD-530.81) - prev on Prevacid OTC... last EGD was 11/99 showing GERD, otherw neg... 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  ** note: she states the Prevacid really helps and the generic Omeprazole didn't help...  IBS (ICD-564.1) - on BENTYL 20mg  prn...last colonoscopy was 7/03 by DrPerry & was normal- no pathologic findings...  GALLSTONES (ICD-574.20)  Hx of ALCOHOLISM (ICD-303.90)  Hx of ADENOCARCINOMA, BREAST (ICD-174.9) - DrMagrinat stopped her Tamoxifen after 67yrs and released her on 12/08... she had a left breast lumpectomy and sentinel node biopsy 12/03 by [facility] for a 2.2cm infiltrating carcinoma, neg LN's, ER/PR pos, treated w/ XRT, then Tamoxifen for 56yrs... ~   Mammogram 5/09 was negative... ~  Mammogram 5/10 at [facility] was neg- fatty replacement, post-op changes...  DEGENERATIVE JOINT DISEASE (ICD-715.90) - she's had a prev lumbar laminectomy & a fractured left hip after a fall (repaired by DrDuda [id number])... ~  [facility] CT[facility]ck 6/12 after fall showed severe DDD & facet dis, and anterolisthesis at several levels... ~  Fall at home 6/12 w/ fx right humerus & fx right patella> treated by DrDuda in hosp & NH rehab...  OSTEOPOROSIS (ICD-733.00) - she is on BONIVA 150mg /month along w/ calcium and vitD supplements... ~  BMD 8/08 shows severe osteoporosis w/ TScores -3.5 in the spine and -3.7 in the hip...  improved from 5/06 study! ~  Vit D level 3/09 = 30 & 50K/wk Rx started...  ~  Vit D level 9/09 = 66 & switched to 1000u OTC daily... ~  BMD 9/10 showed TScores -3.3 spine, and -3.1 in right fem neck... sl improved from 2008, continue Rx, avoid trauma. ~  Labs in [place] 6/12 included Vit D level = 68 ~  Labs 5/13 showed VitD level = 61 ~  5/13:  She does not want to take Boniva any longer & is asking for IV med alternative> we will look into RECLAST eligibility for her...                        NEUROPATHY (ICD-355.9) - on NEURONTIN 300mg  Tid... it really helps her back pain.  Early Dementia >> ~  CT Brain 6/12 in ER after fall showed atrophy & chr sm vessel dis, NAD...  ANXIETY (ICD-300.00) - on LIBRIUM 10mg tid, ZOLOFT 100mg /d and REMERON 15mg Qhs... she wishes to continue all of these the same.  PSORIASIS (ICD-696.1) -  treated by Elnora Morrison w/ MTX- intol pills, now on shots...  ANEMIA (ICD-285.9) - she is rec to take Women's MVI, Vit B12 1059mcg/d, Vit D 1000 u/d... ~  labs 3/09 showed Hg= 11.9 w/ Fe= 67... started [facility] 200mg /d... ~  labs 9/09 showed Hg= 13.3 w/ Fe= 84 ~  labs 9/10 showed Hg= 13.3, Fe= 111... pt stopped the Fe supplement. ~  labs 8/11 showed Hg= 12.9, Fe= 94 (23%sat), B12= 302 ~  Labs 6/12 in [place] showed Hg= 12.9==>10.4 ~  Labs  5/13 showed Hg= 13.3  HEALTH MAINTENANCE:  She takes a lot of Vitamins> B12, D, E, MVI, calcium etc... Plus nutritional supplements- Ensure, Boost, etc...   Past Surgical History  Procedure Date  . Lumbar laminectomy   . 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 Left lung surgery with lllobectomy for squamous cell cancer 09/1998    Dr. Edwyna Shell  . Left breast lumpectomy with sentinel lymph node biopsy   . Left femur fracture repaired 04/2004    Dr. Lajoyce Corners    Outpatient Encounter Prescriptions as of 06/22/2012  Medication Sig Dispense Refill  .  ADVAIR DISKUS 250-50 MCG/DOSE AEPB INHALE 1 PUFF TWICE A DAY  60 each  6  . albuterol (PROVENTIL) (2.5 MG/3ML) 0.083% nebulizer solution Use 1 vial in nebulizer up to 3 times daily as needed FILE UNDER MCR PART B  90 vial  3  . calcium carbonate (OS-CAL) 600 MG TABS Take 600 mg by mouth 2 (two) times daily with a meal.        . chlordiazePOXIDE (LIBRIUM) 10 MG capsule Take 1 capsule (10 mg total) by mouth 3 (three) times daily as needed for anxiety.  90 capsule  5  . cholecalciferol (VITAMIN D) 1000 UNITS tablet Take 1,000 Units by mouth daily.        . cyanocobalamin 2000 MCG tablet Take 2,000 mcg by mouth daily.        Marland Kitchen dicyclomine (BENTYL) 20 MG tablet TAKE 1 TABLET BY MOUTH 3 TIMES A DAY AS NEEDED FOR CRAMPING  100 tablet  2  . gabapentin (NEURONTIN) 300 MG capsule TAKE ONE CAPSULE BY MOUTH 3 TIMES A DAY  90 capsule  5  . ibandronate (BONIVA) 150 MG tablet TAKE 1 TABLET BY MOUTH EVERY MONTH  1 tablet  3  . mirtazapine (REMERON SOL-TAB) 15 MG disintegrating tablet Take 15 mg by mouth at bedtime.      . mirtazapine (REMERON) 15 MG tablet TAKE 1 TABLET EVERY DAY  30 tablet  10  . Multiple Vitamins-Minerals (CENTRUM SILVER PO) Take 1 tablet by mouth daily.        . sertraline (ZOLOFT) 100 MG tablet TAKE ONE TABLET BY MOUTH DAILY  30 tablet  6  . traMADol (ULTRAM) 50 MG tablet TAKE 1 TABLET BY MOUTH 3 TIMES A DAY AS NEEDED  FOR PAIN  90 tablet  8  . triamcinolone (KENALOG) 0.025 % cream Apply 1 application topically 2 (two) times daily.        . vitamin E 400 UNIT capsule Take 400 Units by mouth daily.        Marland Kitchen DISCONTD: mirtazapine (REMERON SOL-TAB) 15 MG disintegrating tablet Take 1 tablet (15 mg total) by mouth at bedtime.  30 tablet  5  . DISCONTD: dicyclomine (BENTYL) 20 MG tablet Take one tablet by mouth 3 times daily as needed for cramping  100 tablet  6    No Known Allergies   Current Medications, Allergies, Past Medical History, Past Surgical History, Family History, and Social History were reviewed in [employer] record.    Review of Systems        See HPI - all other systems neg except as noted...  The patient complains of dyspnea on exertion and muscle weakness.  The patient denies anorexia, fever, weight loss, weight gain, vision loss, decreased hearing, hoarseness, chest pain, syncope, peripheral edema, prolonged cough, headaches, hemoptysis, abdominal pain, melena, hematochezia, severe indigestion/heartburn, hematuria, incontinence, suspicious skin lesions, transient blindness, difficulty walking, depression, unusual weight change, abnormal bleeding, enlarged lymph nodes, and angioedema.     Objective:   Physical Exam    WD, Thin, 78 y/o WF in NAD... she is chr ill apearing... GENERAL:  Alert & oriented; pleasant & cooperative... HEENT:  [place]/AT, EOM-wnl, PERRLA, EACs-clear, TMs-wnl, NOSE-clear, THROAT-clear & wnl. NECK:  Supple w/ fairROM; no JVD; normal carotid impulses w/o bruits; no thyromegaly or nodules palpated; no lymphadenopathy. CHEST:  Clear to P & A; prev left thoracotomy scar; without wheezes/ rales/ or rhonchi heard... HEART:  Regular Rhythm; without murmurs/ rubs/ or gallops detected... ABDOMEN:  Soft &  nontender; normal bowel sounds; no organomegaly or masses palpated... EXT: without deformities, mild arthritic changes; no varicose veins/ venous insuffic/  or edema. NEURO:  CN's intact; no focal neuro deficits x mild neuropathy... DERM:  No lesions noted; no rash etc...  RADIOLOGY DATA:  Reviewed in the EPIC EMR & discussed w/ the patient...  LABORATORY DATA:  Reviewed in the EPIC EMR & discussed w/ the patient...   Assessment & Plan:   COPD>  On NEBS, Advair, Mucinex; continue same & cautiously incr activity; avoid pollen & get air filter for bedroom...  Hx Lung Cancer>  S/p LLLobectomy 1999 & no known recurrence; CXR 5/13 reviewed & chr changes, NAD...  GERD>  On OTC PPI Rx as needed; she denies swallowing difficulty etc, just poor appetite; rec supplements...  IBS>  On Bentyl as needed...  Hx Breast Cancer>  Left breast ca w/ surg 2003, then Tamoxifen & released by DrMagrinat in 2008; no known recurrence to date...  DJD>  Fall at home 6/12 w/ Fx rt arm & patella; attended by DrDuda & improved toward baseline; she uses Tylenol/ Tramadol for pain...  Osteoporosis>  On Boniva + Calcium, MVI, Vit D, etc; she is asking for IV therapy & we will look into Reclast...  Neuro> Dementia, Neuropathy>  On Neurontin, we [facility]...  Anxiety>  She remains quite anxious & well cared for by her daughter; continue same meds...  Anemia>  Hg now improved to 13.2.Marland KitchenMarland Kitchen    Patient's Medications  New Prescriptions   No medications on file  Previous Medications   ADVAIR DISKUS 250-50 MCG/DOSE AEPB    INHALE 1 PUFF TWICE A DAY   ALBUTEROL (PROVENTIL) (2.5 MG/3ML) 0.083% NEBULIZER SOLUTION    Use 1 vial in nebulizer up to 3 times daily as needed FILE UNDER MCR PART B   CALCIUM CARBONATE (OS-CAL) 600 MG TABS    Take 600 mg by mouth 2 (two) times daily with a meal.     CHLORDIAZEPOXIDE (LIBRIUM) 10 MG CAPSULE    Take 1 capsule (10 mg total) by mouth 3 (three) times daily as needed for anxiety.   CHOLECALCIFEROL (VITAMIN D) 1000 UNITS TABLET    Take 1,000 Units by mouth daily.     CYANOCOBALAMIN 2000 MCG TABLET    Take 2,000 mcg by  mouth daily.     DICYCLOMINE (BENTYL) 20 MG TABLET    Take one tablet by mouth 3 times daily as needed for cramping   GABAPENTIN (NEURONTIN) 300 MG CAPSULE    TAKE ONE CAPSULE BY MOUTH 3 TIMES A DAY   IBANDRONATE (BONIVA) 150 MG TABLET    Take 1 tablet (150 mg total) by mouth every 30 (thirty) days. Take in the morning with a full glass of water, on an empty stomach, and do not take anything else by mouth or lie down for the next 30 min.   MIRTAZAPINE (REMERON SOL-TAB) 15 MG DISINTEGRATING TABLET    Take 1 tablet (15 mg total) by mouth at bedtime.   MIRTAZAPINE (REMERON) 15 MG TABLET    TAKE 1 TABLET EVERY DAY   MULTIPLE VITAMINS-MINERALS (CENTRUM SILVER PO)    Take 1 tablet by mouth daily.     SERTRALINE (ZOLOFT) 100 MG TABLET    Take 1 tablet (100 mg total) by mouth daily. Take 1  tablets daily   TRAMADOL (ULTRAM) 50 MG TABLET    TAKE 1 TABLET BY MOUTH 3 TIMES A DAY AS NEEDED FOR PAIN  TRIAMCINOLONE (KENALOG) 0.025 % CREAM    Apply 1 application topically 2 (two) times daily.     VITAMIN E 400 UNIT CAPSULE    Take 400 Units by mouth daily.    Modified Medications   No medications on file  Discontinued Medications   DICYCLOMINE (BENTYL) 20 MG TABLET    TAKE 1 TABLET BY MOUTH 3 TIMES A DAY AS NEEDED FOR CRAMPING   METFORMIN (GLUCOPHAGE) 500 MG TABLET  We can't figure out how thios got on her list but she is not diabetic & hasn't take Metformin.Marland KitchenMarland Kitchen

## 2012-06-22 NOTE — Patient Instructions (Addendum)
I will call with labs  Cont on current regimen Zofran 4mg   Every 8hrs As needed  Nausea  Please contact office for sooner follow up if symptoms do not improve or worsen or seek emergency care  follow up Dr. [name]  As planned

## 2012-06-23 ENCOUNTER — Telehealth: Payer: Self-pay | Admitting: *Deleted

## 2012-06-23 NOTE — Telephone Encounter (Signed)
Received prior auth request for ondansetron 4 mg tablets take 1 tablet po q8h prn for nausea from [facility].  Called [facility] at [phone], spoke with Kin.  Medication APPROVED from March 25, 2012 - June 23, 2013  Ref # [id number]  Freida Busman with [facility] aware of approval.  Pt also aware.

## 2012-06-24 LAB — URINE CULTURE: Colony Count: NO GROWTH

## 2012-06-29 ENCOUNTER — Other Ambulatory Visit: Payer: Self-pay | Admitting: Adult Health

## 2012-06-29 DIAGNOSIS — R300 Dysuria: Secondary | ICD-10-CM

## 2012-06-30 ENCOUNTER — Other Ambulatory Visit (INDEPENDENT_AMBULATORY_CARE_PROVIDER_SITE_OTHER): Payer: Medicare Other

## 2012-06-30 ENCOUNTER — Telehealth: Payer: Self-pay | Admitting: Pulmonary Disease

## 2012-06-30 DIAGNOSIS — R300 Dysuria: Secondary | ICD-10-CM

## 2012-06-30 LAB — BASIC METABOLIC PANEL
BUN: 14 mg/dL (ref 6–23)
CO2: 25 mEq/L (ref 19–32)
Calcium: 8.7 mg/dL (ref 8.4–10.5)
Chloride: 103 mEq/L (ref 96–112)
Creatinine, Ser: 0.8 mg/dL (ref 0.4–1.2)
Glucose, Bld: 78 mg/dL (ref 70–99)

## 2012-06-30 NOTE — Telephone Encounter (Signed)
Result Notes     Notes Recorded by Sherre Lain, MA on 06/29/2012 at 3:46 PM Called spoke with patient, advised of lab results / recs as stated by TP. Pt verbalized her understanding and will come this week for repeat BMET. Orders only encounter created for the order. ------  Notes Recorded by Julio Sicks, NP on 06/24/2012 at 11:02 AM Urine cx was neg, no sign of UTI  Nothing to explain symptoms  K+ was high will need to repeat bmet - suspect was hemolyzed  Please contact office for sooner follow up if symptoms do not improve or worsen or seek emergency care       ======================== Noted.  Thank you.  Will sign and forward to my inbox and TP's as well as FYI.

## 2012-07-22 ENCOUNTER — Telehealth: Payer: Self-pay | Admitting: Pulmonary Disease

## 2012-07-22 NOTE — Telephone Encounter (Signed)
Daughter Bridget Hartshorn called back.  Please call her @ [phone].  Leanora Ivanoff

## 2012-07-22 NOTE — Telephone Encounter (Signed)
Called and spoke with paula---per SN--ok to bring pt in for appt tomorrow around 12.  [name] is aware and nothing further is needed.

## 2012-07-22 NOTE — Telephone Encounter (Signed)
I spoke with daughter [name] and she stated pt is feeling dizzy when she tried to walk and is staggering. Thinks it could be due to low blood sugars but is not sure bc she has nothing to check this with. Daughter gave pt an ensure to drink and seems to be doing some better. Pt has nothing to take for the dizziness. They would like pt to be seen tomorrow but nothing availabel. Please advise Dr. [name] thanks

## 2012-07-23 ENCOUNTER — Encounter: Payer: Self-pay | Admitting: Pulmonary Disease

## 2012-07-23 ENCOUNTER — Ambulatory Visit (INDEPENDENT_AMBULATORY_CARE_PROVIDER_SITE_OTHER): Payer: Medicare Other | Admitting: Pulmonary Disease

## 2012-07-23 VITALS — BP 100/72 | HR 76 | Temp 96.8°F | Ht 60.0 in | Wt 80.8 lb

## 2012-07-23 DIAGNOSIS — G589 Mononeuropathy, unspecified: Secondary | ICD-10-CM

## 2012-07-23 DIAGNOSIS — R110 Nausea: Secondary | ICD-10-CM

## 2012-07-23 DIAGNOSIS — R42 Dizziness and giddiness: Secondary | ICD-10-CM

## 2012-07-23 DIAGNOSIS — R634 Abnormal weight loss: Secondary | ICD-10-CM

## 2012-07-23 DIAGNOSIS — K589 Irritable bowel syndrome without diarrhea: Secondary | ICD-10-CM

## 2012-07-23 DIAGNOSIS — J449 Chronic obstructive pulmonary disease, unspecified: Secondary | ICD-10-CM

## 2012-07-23 DIAGNOSIS — M1990 Unspecified osteoarthritis, unspecified site: Secondary | ICD-10-CM

## 2012-07-23 DIAGNOSIS — R630 Anorexia: Secondary | ICD-10-CM

## 2012-07-23 DIAGNOSIS — C3490 Malignant neoplasm of unspecified part of unspecified bronchus or lung: Secondary | ICD-10-CM

## 2012-07-23 DIAGNOSIS — C50919 Malignant neoplasm of unspecified site of unspecified female breast: Secondary | ICD-10-CM

## 2012-07-23 DIAGNOSIS — K219 Gastro-esophageal reflux disease without esophagitis: Secondary | ICD-10-CM

## 2012-07-23 DIAGNOSIS — M810 Age-related osteoporosis without current pathological fracture: Secondary | ICD-10-CM

## 2012-07-23 MED ORDER — MEGESTROL ACETATE 40 MG/ML PO SUSP: ORAL | Status: DC

## 2012-07-23 MED ORDER — MECLIZINE HCL 25 MG PO TABS: ORAL_TABLET | ORAL | Status: DC

## 2012-07-23 MED ORDER — ONDANSETRON HCL 4 MG PO TABS: 4.0000 mg | ORAL_TABLET | ORAL | Status: DC | PRN

## 2012-07-23 NOTE — Progress Notes (Signed)
Subjective:    Patient ID: Leslie [name], female    DOB: 18-Jun-1929, 78 y.o.   MRN: [id number]  HPI 78 y/o WF here for a 6 month follow up visit... she has mult medical problems as listed...   ~  March 16, 20012:  229mo ROV doing well w/o new complaints or concerns... she is off the MTX for her psoriasis & using Clobetasol cream... stable on her Advair/ Nebs... GI is stable on her PPI & Benty... Ortho stable on the Boniva,calcium, vits, Vit D & Tramadol... she requests refill prescriptions for 30d supplies...  ~  October 07, 2011:  67mo ROV & she has mult somatic complaints: dizzy in the AM (she refuses Neuro consult), pruritic rash on legs, poor appetite, weight stable ~80#, etc;  She requests flu shot & handicap sticker signed today...  See prob list below:    She fell at home 6/12 & was [facility] w/ fx right humerus, right patella, (seen by DrDuda for Ortho) & required 29mo in the NH for rehab [facility] records reviewed today); now improved back to baseline...  meds reviewed.  ~  Apr 09, 2012:  229mo ROV & she is c/o feeling bad, more SOB, all due to the pollen she thinks; on NEBS, Advair, & Albut HFA rescue inhaler; reminded to use NEBS 2-4x daily & the Advair Bid every day (the rescue inhaler is just for prn use when out & about); asked to get air filter at home to decr pollen exposure...    She has DJD & LBP w/ prev surg; she fell 6/12 w/ fx right humerus & patella- Rx DrDuda; she has been on Boniva for her severe Osteoporosis & wants to stop this & switch to IV med- we will see if she is elig & cost of RECLAST for her...  CXR 5/13 showed stable post-op changes/ vol loss/ scarring on left, atherosclerotic calcif in Ao, COPD changes, NAD... LABS 5/13:  Chems- wnl;  CBC- wnl;  TSH=2.45;  VitD=61;  VitB12>1500...  ~  July 23, 2012:  64mo ROV & add-on appt for dizziness> they report onset of ?dizziness & nausea w/o vomit yest x1d; pt states "just liked to fall- I can't explain it, it scared me";  She denies  focal weakness, facial changes, slurred speech, focal weakness, or sensory changes;  She denies CP, palpit, syncope, etc;  She has hx of "inner ear" w/ similar symptoms yrs ago;  Family wondered if symptoms could come from poor appetite & poor intake, perhaps hypoglycemia as she seemed to do better after Boost...    We discussed Rx w/ Meclizine prn & Zofran for the nausea;  She did not want to pursue Cardiac arrhythmia or Neuro evaluations at this point...    Family did request appetite stimulant & we wrote for MEGACE 40mg /ml> 1 tsp po Qid for appetite; plus 6 sm meals daily (may use Ensure/ Boost betw N/ L/ D & bedtime...   Problem List:      HEARING LOSS (ICD-389.9) - she refuses hearing eval...  DIZZINESS >> we started MECLIZINE 25mg - 1/2 to 1 tab every 4H as needed...  COPD (ICD-496) - ex-smoker... on ALBUT NEBS up to Qid, ADVAIR 250Bid, & Prn Mucinex... ~  CTChest 6/06 w/ stable post-op changes on left, no recurrence, scarring RLL, sm gallstones... ~  CXR 8/08 w/ vol loss & post-op changes on left, right clear...  ~  CXR 9/09 showed stable post op changes on the left, NAD.Marland Kitchen. ~  CXR 9/10 unchanged-  stable post op appearance of left chest, NAD.Marland Kitchen. ~  CXR 8/11 showed post op changes & scarring, COPD, NAD.Marland Kitchen. ~  CXR 6/12 in [place] showed chr changes w/ scarring & blunt angle on left, hyperinflation on right, diffuse osteopenia, NAD.Marland Kitchen. ~  CXR 5/13 showed stable post-op changes/ vol loss/ scarring on left, atherosclerotic calcif in Ao, COPD changes, NAD...  Hx of CARCINOMA, LUNG, SQUAMOUS CELL (ICD-162.9) - s/p left thoracotomy w/ LLLobectomy for squamous cell ca 10/99 by DrBurney... no known recurrence.  GERD (ICD-530.81) - prev on Prevacid OTC... last EGD was 11/99 showing GERD, otherw neg... 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  note: she states the Prevacid really helps and the generic Omeprazole didn't help... ~   8/13:  We added ZOFRAN 4mg  Q4H as needed for nausea...  IBS (ICD-564.1) - on BENTYL 20mg  prn...last colonoscopy was 7/03 by DrPerry & was normal- no pathologic findings...  GALLSTONES (ICD-574.20)  Hx of ALCOHOLISM (ICD-303.90)  Hx of ADENOCARCINOMA, BREAST (ICD-174.9) - DrMagrinat stopped her Tamoxifen after 57yrs and released her on 12/08... she had a left breast lumpectomy and sentinel node biopsy 12/03 by [facility] for a 2.2cm infiltrating carcinoma, neg LN's, ER/PR pos, treated w/ XRT, then Tamoxifen for 70yrs... ~  Mammogram 5/09 was negative... ~  Mammogram 5/10 at [facility] was neg- fatty replacement, post-op changes...  DEGENERATIVE JOINT DISEASE (ICD-715.90) - she's had a prev lumbar laminectomy & a fractured left hip after a fall (repaired by DrDuda [id number])... ~  [facility] CT[facility] 6/12 after fall showed severe DDD & facet dis, and anterolisthesis at several levels... ~  Fall at home 6/12 w/ fx right humerus & fx right patella> treated by DrDuda in hosp & NH rehab...  OSTEOPOROSIS (ICD-733.00) - she was prev on Boniva150mg /month along w/ calcium and vitD supplements==> given IV RECLAST 05/07/12. ~  BMD 8/08 shows severe osteoporosis w/ TScores -3.5 in the spine and -3.7 in the hip...  improved from 5/06 study! ~  Vit D level 3/09 = 30 & 50K/wk Rx started...  ~  Vit D level 9/09 = 66 & switched to 1000u OTC daily... ~  BMD 9/10 showed TScores -3.3 spine, and -3.1 in right fem neck... sl improved from 2008, continue Rx, avoid trauma. ~  Labs in [facility] 6/12 included Vit D level = 68 ~  Labs 5/13 showed VitD level = 61 ~  5/13:  She does not want to take Boniva any longer & is asking for IV med alternative> we will look into RECLAST eligibility for her==>  given 1st dose IV RECLAST 05/07/12...                        NEUROPATHY (ICD-355.9) - on NEURONTIN 300mg  Tid... it really helps her back pain.  Early Dementia >> ~  CT Brain 6/12 in ER after fall showed atrophy & chr sm vessel dis,  NAD...  ANXIETY (ICD-300.00) - on LIBRIUM 10mg tid, ZOLOFT 100mg /d and REMERON 15mg Qhs... she wishes to continue all of these the same.  PSORIASIS (ICD-696.1) -  treated by Elnora Morrison w/ MTX- intol pills, now on shots...  ANEMIA (ICD-285.9) - she is rec to take Women's MVI, Vit B12 1018mcg/d, Vit D 1000 u/d... ~  labs 3/09 showed Hg= 11.9 w/ Fe= 67... started [facility] 200mg /d... ~  labs 9/09 showed Hg= 13.3 w/ Fe= 84 ~  labs 9/10 showed Hg= 13.3, Fe= 111... pt stopped the  Fe supplement. ~  labs 8/11 showed Hg= 12.9, Fe= 94 (23%sat), B12= 302 ~  Labs 6/12 in [place] showed Hg= 12.9==>10.4 ~  Labs 5/13 showed Hg= 13.3  HEALTH MAINTENANCE:  She takes a lot of Vitamins> B12, D, E, MVI, calcium etc... Plus nutritional supplements- Ensure, Boost, etc...   Past Surgical History  Procedure Date  . Lumbar laminectomy   . 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 Left lung surgery with lllobectomy for squamous cell cancer 09/1998    Dr. Edwyna Shell  . Left breast lumpectomy with sentinel lymph node biopsy   . Left femur fracture repaired 04/2004    Dr. Lajoyce Corners    Outpatient Encounter Prescriptions as of 07/23/2012  Medication Sig Dispense Refill  . ADVAIR DISKUS 250-50 MCG/DOSE AEPB INHALE 1 PUFF TWICE A DAY  60 each  6  . albuterol (PROVENTIL) (2.5 MG/3ML) 0.083% nebulizer solution Use 1 vial in nebulizer up to 3 times daily as needed FILE UNDER MCR PART B  90 vial  3  . calcium carbonate (OS-CAL) 600 MG TABS Take 600 mg by mouth 2 (two) times daily with a meal.        . chlordiazePOXIDE (LIBRIUM) 10 MG capsule Take 1 capsule (10 mg total) by mouth 3 (three) times daily as needed for anxiety.  90 capsule  5  . cholecalciferol (VITAMIN D) 1000 UNITS tablet Take 1,000 Units by mouth daily.        . cyanocobalamin 2000 MCG tablet Take 2,000 mcg by mouth daily.        Marland Kitchen dicyclomine (BENTYL) 20 MG tablet TAKE 1 TABLET BY MOUTH 3 TIMES A DAY AS NEEDED FOR CRAMPING  100 tablet  2  .  gabapentin (NEURONTIN) 300 MG capsule TAKE ONE CAPSULE BY MOUTH 3 TIMES A DAY  90 capsule  5  . ibandronate (BONIVA) 150 MG tablet TAKE 1 TABLET BY MOUTH EVERY MONTH  1 tablet  3  . mirtazapine (REMERON) 15 MG tablet TAKE 1 TABLET EVERY DAY  30 tablet  10  . Multiple Vitamins-Minerals (CENTRUM SILVER PO) Take 1 tablet by mouth daily.        . ondansetron (ZOFRAN) 4 MG tablet Take 1 tablet (4 mg total) by mouth every 4 (four) hours as needed for nausea.  20 tablet  6  . sertraline (ZOLOFT) 100 MG tablet TAKE ONE TABLET BY MOUTH DAILY  30 tablet  6  . traMADol (ULTRAM) 50 MG tablet TAKE 1 TABLET BY MOUTH 3 TIMES A DAY AS NEEDED FOR PAIN  90 tablet  8  . triamcinolone (KENALOG) 0.025 % cream Apply 1 application topically 2 (two) times daily.        . vitamin E 400 UNIT capsule Take 400 Units by mouth daily.        Marland Kitchen DISCONTD: ondansetron (ZOFRAN) 4 MG tablet Take 1 tablet (4 mg total) by mouth every 8 (eight) hours as needed for nausea.  20 tablet  0  . meclizine (ANTIVERT) 25 MG tablet Take 1/2 to 1 tablet by mouth every 4 hours as needed for dizziness  50 tablet  6  . megestrol (MEGACE ORAL) 40 MG/ML suspension Take 1 teaspoonful by mouth four times daily  600 mL  6  . DISCONTD: mirtazapine (REMERON SOL-TAB) 15 MG disintegrating tablet Take 15 mg by mouth at bedtime.        No Known Allergies   Current Medications, Allergies, Past Medical History, Past Surgical History, Family History,  and Social History were reviewed in [employer] record.    Review of Systems        See HPI - all other systems neg except as noted...  The patient complains of dyspnea on exertion and muscle weakness.  The patient denies anorexia, fever, weight loss, weight gain, vision loss, decreased hearing, hoarseness, chest pain, syncope, peripheral edema, prolonged cough, headaches, hemoptysis, abdominal pain, melena, hematochezia, severe indigestion/heartburn, hematuria, incontinence, suspicious  skin lesions, transient blindness, difficulty walking, depression, unusual weight change, abnormal bleeding, enlarged lymph nodes, and angioedema.     Objective:   Physical Exam    WD, Thin, 78 y/o WF in NAD... she is chr ill apearing... GENERAL:  Alert & oriented; pleasant & cooperative... HEENT:  [place]/AT, EOM-wnl, PERRLA, EACs-clear, TMs-wnl, NOSE-clear, THROAT-clear & wnl. NECK:  Supple w/ fairROM; no JVD; normal carotid impulses w/o bruits; no thyromegaly or nodules palpated; no lymphadenopathy. CHEST:  Clear to P & A; prev left thoracotomy scar; without wheezes/ rales/ or rhonchi heard... HEART:  Regular Rhythm; without murmurs/ rubs/ or gallops detected... ABDOMEN:  Soft & nontender; normal bowel sounds; no organomegaly or masses palpated... EXT: without deformities, mild arthritic changes; no varicose veins/ venous insuffic/ or edema. NEURO:  CN's intact; no focal neuro deficits x mild neuropathy... DERM:  No lesions noted; no rash etc...  RADIOLOGY DATA:  Reviewed in the EPIC EMR & discussed w/ the patient...  LABORATORY DATA:  Reviewed in the EPIC EMR & discussed w/ the patient...   Assessment & Plan:   DIZZINESS> Rx w/ Meclizine 25mg - 1/2 to 1 tab Q4H as needed... NAUSEA> Rx w/ Zofran 4mg  Q4H as needed.... WEIGHT LOSS/ ANOREXIA> Try Rx for appetite w/ Megace 40mg /ml- 1 tsp Qid...  COPD>  On NEBS, Advair, Mucinex; continue same & cautiously incr activity; avoid pollen & get air filter for bedroom...  Hx Lung Cancer>  S/p LLLobectomy 1999 & no known recurrence; CXR 5/13 reviewed & chr changes, NAD...  GERD>  On OTC PPI Rx as needed; she denies swallowing difficulty etc, just poor appetite; rec supplements...  IBS>  On Bentyl as needed...  Hx Breast Cancer>  Left breast ca w/ surg 2003, then Tamoxifen & released by DrMagrinat in 2008; no known recurrence to date...  DJD>  Fall at home 6/12 w/ Fx rt arm & patella; attended by DrDuda & improved toward baseline; she uses  Tylenol/ Tramadol for pain...  Osteoporosis>  Prev on Boniva, but she received her 1st dose of IV Reclast 05/07/12...  Neuro> Dementia, Neuropathy>  On Neurontin, we reviewed [facility]rain [facility]...  Anxiety>  She remains quite anxious & well cared for by her daughter; continue same meds...  Anemia>  Hg now improved to 13.2.Marland KitchenMarland Kitchen    Patient's Medications  New Prescriptions   No medications on file  Previous Medications   ADVAIR DISKUS 250-50 MCG/DOSE AEPB    INHALE 1 PUFF TWICE A DAY   ALBUTEROL (PROVENTIL) (2.5 MG/3ML) 0.083% NEBULIZER SOLUTION    Use 1 vial in nebulizer up to 3 times daily as needed FILE UNDER MCR PART B   CALCIUM CARBONATE (OS-CAL) 600 MG TABS    Take 600 mg by mouth 2 (two) times daily with a meal.     CHLORDIAZEPOXIDE (LIBRIUM) 10 MG CAPSULE    Take 1 capsule (10 mg total) by mouth 3 (three) times daily as needed for anxiety.   CHOLECALCIFEROL (VITAMIN D) 1000 UNITS TABLET    Take 1,000 Units by mouth daily.  CYANOCOBALAMIN 2000 MCG TABLET    Take 2,000 mcg by mouth daily.     DICYCLOMINE (BENTYL) 20 MG TABLET    Take one tablet by mouth 3 times daily as needed for cramping   GABAPENTIN (NEURONTIN) 300 MG CAPSULE    TAKE ONE CAPSULE BY MOUTH 3 TIMES A DAY   IBANDRONATE (BONIVA) 150 MG TABLET       << OFF BONIVA now &                      received IV RECLAST 04/3112 >>                 << next IV Reclast due 05/08/13 >>   MIRTAZAPINE (REMERON SOL-TAB) 15 MG DISINTEGRATING TABLET    Take 1 tablet (15 mg total) by mouth at bedtime.   MIRTAZAPINE (REMERON) 15 MG TABLET    TAKE 1 TABLET EVERY DAY   MULTIPLE VITAMINS-MINERALS (CENTRUM SILVER PO)    Take 1 tablet by mouth daily.     SERTRALINE (ZOLOFT) 100 MG TABLET    Take 1 tablet (100 mg total) by mouth daily. Take 1  tablets daily   TRAMADOL (ULTRAM) 50 MG TABLET    TAKE 1 TABLET BY MOUTH 3 TIMES A DAY AS NEEDED FOR PAIN   TRIAMCINOLONE (KENALOG) 0.025 % CREAM    Apply 1 application topically 2 (two) times daily.      VITAMIN E 400 UNIT CAPSULE    Take 400 Units by mouth daily.    Modified Medications   No medications on file  Discontinued Medications   DICYCLOMINE (BENTYL) 20 MG TABLET    TAKE 1 TABLET BY MOUTH 3 TIMES A DAY AS NEEDED FOR CRAMPING   METFORMIN (GLUCOPHAGE) 500 MG TABLET  We can't figure out how this got on her list but she is not diabetic & hasn't take Metformin.Marland KitchenMarland Kitchen

## 2012-07-23 NOTE — Patient Instructions (Addendum)
Today we updated your med list in our EPIC system...    Continue your current medications the same...  We wrote new prescriptions for:    Dizzinees> use the MECLIZINE 25mg - 1/2 to 1 tab every 4H as needed...    Nausea> use the ZOFRAN 4mg - one tab every 4H as needed...  In addition we wrote a prescription for an appetite stimulant> MEGACE:    Try 1 teasp by mouth 4 times daily for appetite...  Remember to try 6 small meals rather than 3 larger ones, and you may use the BOOST as the meal betw Breakfast/ Lunch/ Dinner/ bedtime snack...  Call for any questions.Marland KitchenMarland Kitchen

## 2012-08-10 ENCOUNTER — Other Ambulatory Visit: Payer: Self-pay | Admitting: Pulmonary Disease

## 2012-08-10 MED ORDER — CHLORDIAZEPOXIDE HCL 10 MG PO CAPS: 10.0000 mg | ORAL_CAPSULE | Freq: Three times a day (TID) | ORAL | Status: DC | PRN

## 2012-08-10 NOTE — Telephone Encounter (Signed)
Received refill request from [facility] for pt's chlordiazepoxide 10mg  1 po TID PRN anxiety.  Pt last seen by SN 07-23-12, upcoming 10-15-12.  Med last refilled #90 with 5 refills on 03-03-12.  Rx telephoned to pharmacy on the voicemail as last filled with pt's information including her dob and phone number.  Also included SN's state license and DEA number.  MAR updated.

## 2012-10-14 ENCOUNTER — Encounter: Payer: Self-pay | Admitting: *Deleted

## 2012-10-15 ENCOUNTER — Ambulatory Visit (INDEPENDENT_AMBULATORY_CARE_PROVIDER_SITE_OTHER): Payer: Medicare Other | Admitting: Pulmonary Disease

## 2012-10-15 ENCOUNTER — Encounter: Payer: Self-pay | Admitting: Pulmonary Disease

## 2012-10-15 VITALS — BP 124/68 | HR 73 | Temp 97.4°F | Ht 60.0 in | Wt 81.8 lb

## 2012-10-15 DIAGNOSIS — K8020 Calculus of gallbladder without cholecystitis without obstruction: Secondary | ICD-10-CM

## 2012-10-15 DIAGNOSIS — K219 Gastro-esophageal reflux disease without esophagitis: Secondary | ICD-10-CM

## 2012-10-15 DIAGNOSIS — J4489 Other specified chronic obstructive pulmonary disease: Secondary | ICD-10-CM

## 2012-10-15 DIAGNOSIS — J449 Chronic obstructive pulmonary disease, unspecified: Secondary | ICD-10-CM

## 2012-10-15 DIAGNOSIS — C3490 Malignant neoplasm of unspecified part of unspecified bronchus or lung: Secondary | ICD-10-CM

## 2012-10-15 DIAGNOSIS — C50919 Malignant neoplasm of unspecified site of unspecified female breast: Secondary | ICD-10-CM

## 2012-10-15 DIAGNOSIS — G589 Mononeuropathy, unspecified: Secondary | ICD-10-CM

## 2012-10-15 DIAGNOSIS — F411 Generalized anxiety disorder: Secondary | ICD-10-CM

## 2012-10-15 DIAGNOSIS — Z23 Encounter for immunization: Secondary | ICD-10-CM

## 2012-10-15 DIAGNOSIS — M810 Age-related osteoporosis without current pathological fracture: Secondary | ICD-10-CM

## 2012-10-15 DIAGNOSIS — M1990 Unspecified osteoarthritis, unspecified site: Secondary | ICD-10-CM

## 2012-10-15 DIAGNOSIS — K589 Irritable bowel syndrome without diarrhea: Secondary | ICD-10-CM

## 2012-10-15 MED ORDER — LANSOPRAZOLE 15 MG PO CPDR: 15.0000 mg | DELAYED_RELEASE_CAPSULE | Freq: Two times a day (BID) | ORAL | Status: DC

## 2012-10-15 NOTE — Patient Instructions (Addendum)
Today we updated your med list in our EPIC system...    Continue your current medications the same...  We decided to increase the PREVACIS 15mg  to twice daily- taken prior to the 1st & last meals of the day...  Call for any questions...  Let's plan a follow up visit w/ CXR & FASTING blood work in 6 months.Marland KitchenMarland Kitchen

## 2012-10-16 ENCOUNTER — Encounter: Payer: Self-pay | Admitting: Pulmonary Disease

## 2012-10-16 NOTE — Progress Notes (Signed)
Subjective:    Patient ID: Leslie [name], female    DOB: [birth year], 78 y.o.   MRN: [id number]  HPI 78 y/o WF here for a 6 month follow up visit... she has mult medical problems as listed...   ~  March 16, 20012:  31mo ROV doing well w/o new complaints or concerns... she is off the MTX for her psoriasis & using Clobetasol cream... stable on her Advair/ Nebs... GI is stable on her PPI & Benty... Ortho stable on the Boniva,calcium, vits, Vit D & Tramadol... she requests refill prescriptions for 30d supplies...  ~  October 07, 2011:  471mo ROV & she has mult somatic complaints: dizzy in the AM (she refuses Neuro consult), pruritic rash on legs, poor appetite, weight stable ~80#, etc;  She requests flu shot & handicap sticker signed today...  See prob list below:    She fell at home 6/12 & was [facility] w/ fx right humerus, right patella, (seen by DrDuda for Ortho) & required 49mo in the NH for rehab [facility] records reviewed today); now improved back to baseline...  meds reviewed.  ~  Apr 09, 2012:  31mo ROV & she is c/o feeling bad, more SOB, all due to the pollen she thinks; on NEBS, Advair, & Albut HFA rescue inhaler; reminded to use NEBS 2-4x daily & the Advair Bid every day (the rescue inhaler is just for prn use when out & about); asked to get air filter at home to decr pollen exposure...    She has DJD & LBP w/ prev surg; she fell 6/12 w/ fx right humerus & patella- Rx DrDuda; she has been on Boniva for her severe Osteoporosis & wants to stop this & switch to IV med- we will see if she is elig & cost of RECLAST for her...  CXR 5/13 showed stable post-op changes/ vol loss/ scarring on left, atherosclerotic calcif in Ao, COPD changes, NAD... LABS 5/13:  Chems- wnl;  CBC- wnl;  TSH=2.45;  VitD=61;  VitB12>1500...  ~  July 23, 2012:  760mo ROV & add-on appt for dizziness> they report onset of ?dizziness & nausea w/o vomit yest x1d; pt states "just liked to fall- I can't explain it, it scared me";  She denies  focal weakness, facial changes, slurred speech, or sensory changes;  She denies CP, palpit, syncope, etc;  She has hx of "inner ear" w/ similar symptoms yrs ago;  Family wondered if symptoms could come from poor appetite & poor intake, perhaps hypoglycemia as she seemed to do better after Boost...    We discussed Rx w/ Meclizine prn & Zofran for the nausea;  She did not want to pursue Cardiac arrhythmia or Neuro evaluations at this point...    Family did request appetite stimulant & we wrote for MEGACE 40mg /ml> 1 tsp po Qid for appetite; plus 6 sm meals daily (may use Ensure/ Boost betw N/ L/ D & bedtime...  ~  October 15, 2012:  760mo ROV & [name] has gained 2# to 82# today taking Megace but just 1tsp daily (rec to incr to Qid as prescribed); she also notes that Meclizine has helped dizziness, & Zofran helped the nausea; she wants to incr her Prevacid 15mg  to Bid- ok...    COPD, Hx LungCa, s/p LLLobectomy 1999> on Advair250, NEBS, Mucinex; CXR 5/13 w/ chr post op changes, NAD...    GERD> on Prev15; wants to incr to Bid as above- ok...    IBS w/ Constip> on Bentyl & Miralax but  won't take it regularly as advised...    Hx Breast Ca> she has been off Tamoxifen since 2008; she needs to get her f/u mammogram at [facility]...    DJD, Osteoporosis> s/p LLam, left hip fx, right humerus fx, right patella fx, & severe DDD; Ortho per DrDuda; prev on boniva, now Recleast w/ infusion 5/13...    Dementia, Neuropathy> CT w/ atrophy 7 chr sm vessel dis; on Neurontin 300Tid    Anxiety> hx Etoh in past, on Librium, Zoloft, Remeron...    Psoriasis> followed by DrFHouston... We reviewed prob list, meds, xrays and labs> see below for updates >>           Problem List:      HEARING LOSS (ICD-389.9) - she refuses hearing eval...  DIZZINESS >> we started MECLIZINE 25mg - 1/2 to 1 tab every 4H as needed...  COPD (ICD-496) - ex-smoker... on ALBUT NEBS up to Qid, ADVAIR 250Bid, & Prn Mucinex... ~  CTChest 6/06 w/ stable  post-op changes on left, no recurrence, scarring RLL, sm gallstones... ~  CXR 8/08 w/ vol loss & post-op changes on left, right clear...  ~  CXR 9/09 showed stable post op changes on the left, NAD.Marland Kitchen. ~  CXR 9/10 unchanged- stable post op appearance of left chest, NAD.Marland Kitchen. ~  CXR 8/11 showed post op changes & scarring, COPD, NAD.Marland Kitchen. ~  CXR 6/12 in [place] showed chr changes w/ scarring & blunt angle on left, hyperinflation on right, diffuse osteopenia, NAD.Marland Kitchen. ~  CXR 5/13 showed stable post-op changes/ vol loss/ scarring on left, atherosclerotic calcif in Ao, COPD changes, NAD...  Hx of CARCINOMA, LUNG, SQUAMOUS CELL (ICD-162.9) - s/p left thoracotomy w/ LLLobectomy for squamous cell ca 10/99 by DrBurney... no known recurrence.  GERD (ICD-530.81) - prev on Prevacid OTC... last EGD was 11/99 showing GERD, otherw neg... 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  note: she states the Prevacid really helps and the generic Omeprazole didn't help... ~  8/13:  We added ZOFRAN 4mg  Q4H as needed for nausea... ~  11/13:  She requests to incr her PREVACID 15mg  to Bid- OK...  IBS- Constipation  >>  on BENTYL 20mg  prn...last colonoscopy was 7/03 by DrPerry & was normal- no pathologic findings... ~  5/13:  Asked to start Miralax 1 capful in water daily...  GALLSTONES (ICD-574.20)  Hx of ALCOHOLISM (ICD-303.90)  Hx of ADENOCARCINOMA, BREAST (ICD-174.9) - DrMagrinat stopped her Tamoxifen after 28yrs and released her on 12/08... she had a left breast lumpectomy and sentinel node biopsy 12/03 by [facility] for a 2.2cm infiltrating carcinoma, neg LN's, ER/PR pos, treated w/ XRT, then Tamoxifen for 20yrs... ~  Mammogram 5/09 was negative... ~  Mammogram 5/10 at [facility] was neg- fatty replacement, post-op changes... ~  She is overdue for f/u mammogram & will call [facility] at her convenience...  DEGENERATIVE JOINT DISEASE (ICD-715.90) - she's  had a prev lumbar laminectomy & a fractured left hip after a fall (repaired by DrDuda [id number])... ~  [facility] CTNeck 6/12 after fall showed severe DDD & facet dis, and anterolisthesis at several levels... ~  Fall at home 6/12 w/ fx right humerus & fx right patella> treated by DrDuda in hosp & NH rehab...  OSTEOPOROSIS (ICD-733.00) - she was prev on Boniva150mg /month along w/ calcium and vitD supplements==> given IV RECLAST 05/07/12. ~  BMD 8/08 shows severe osteoporosis w/ TScores -3.5 in the spine and -3.7 in the hip.Marland KitchenMarland Kitchen  improved from 5/06 study! ~  Vit D level 3/09 = 30 & 50K/wk Rx started...  ~  Vit D level 9/09 = 66 & switched to 1000u OTC daily... ~  BMD 9/10 showed TScores -3.3 spine, and -3.1 in right fem neck... sl improved from 2008, continue Rx, avoid trauma. ~  Labs in [facility] 6/12 included Vit D level = 68 ~  Labs 5/13 showed VitD level = 61 ~  5/13:  She does not want to take Boniva any longer & is asking for IV med alternative> we will look into RECLAST eligibility for her==>  given 1st dose IV RECLAST 05/07/12...                        NEUROPATHY (ICD-355.9) - on NEURONTIN 300mg  Tid... it really helps her back pain.  Early Dementia >> ~  CT Brain 6/12 in ER after fall showed atrophy & chr sm vessel dis, NAD...  ANXIETY (ICD-300.00) - on LIBRIUM 10mg tid, ZOLOFT 100mg /d and REMERON 15mg Qhs... she wishes to continue all of these the same.  PSORIASIS (ICD-696.1) -  treated by Elnora Morrison w/ MTX- intol pills, now on shots...  ANEMIA (ICD-285.9) - she is rec to take Women's MVI, Vit B12 1021mcg/d, Vit D 1000 u/d... ~  labs 3/09 showed Hg= 11.9 w/ Fe= 67... started Feosol 200mg /d... ~  labs 9/09 showed Hg= 13.3 w/ Fe= 84 ~  labs 9/10 showed Hg= 13.3, Fe= 111... pt stopped the Fe supplement. ~  labs 8/11 showed Hg= 12.9, Fe= 94 (23%sat), B12= 302 ~  Labs 6/12 in [place] showed Hg= 12.9==>10.4 ~  Labs 5/13 showed Hg= 13.3  HEALTH MAINTENANCE:  She takes a lot of Vitamins> B12, D, E, MVI,  calcium etc... Plus nutritional supplements- Ensure, Boost, etc...   Past Surgical History  Procedure Date  . Lumbar laminectomy   . 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 Left lung surgery with lllobectomy for squamous cell cancer 09/1998    Dr. Edwyna Shell  . Left breast lumpectomy with sentinel lymph node biopsy   . Left femur fracture repaired 04/2004    Dr. Lajoyce Corners    Outpatient Encounter Prescriptions as of 10/15/2012  Medication Sig Dispense Refill  . ADVAIR DISKUS 250-50 MCG/DOSE AEPB INHALE 1 PUFF TWICE A DAY  60 each  6  . albuterol (PROVENTIL) (2.5 MG/3ML) 0.083% nebulizer solution Use 1 vial in nebulizer up to 3 times daily as needed FILE UNDER MCR PART B  90 vial  3  . calcium carbonate (OS-CAL) 600 MG TABS Take 600 mg by mouth 2 (two) times daily with a meal.        . chlordiazePOXIDE (LIBRIUM) 10 MG capsule Take 1 capsule (10 mg total) by mouth 3 (three) times daily as needed for anxiety.  90 capsule  5  . cholecalciferol (VITAMIN D) 1000 UNITS tablet Take 1,000 Units by mouth daily.        . cyanocobalamin 2000 MCG tablet Take 2,000 mcg by mouth daily.        Marland Kitchen dicyclomine (BENTYL) 20 MG tablet TAKE 1 TABLET BY MOUTH 3 TIMES A DAY AS NEEDED FOR CRAMPING  100 tablet  2  . gabapentin (NEURONTIN) 300 MG capsule TAKE ONE CAPSULE BY MOUTH 3 TIMES A DAY  90 capsule  5  . ibandronate (BONIVA) 150 MG tablet TAKE 1 TABLET BY MOUTH EVERY MONTH  1 tablet  3  . lansoprazole (PREVACID) 15 MG capsule Take  1 capsule (15 mg total) by mouth 2 (two) times daily.  60 capsule  11  . meclizine (ANTIVERT) 25 MG tablet Take 1/2 to 1 tablet by mouth every 4 hours as needed for dizziness  50 tablet  6  . megestrol (MEGACE ORAL) 40 MG/ML suspension Take 1 teaspoonful by mouth four times daily  600 mL  6  . mirtazapine (REMERON) 15 MG tablet TAKE 1 TABLET EVERY DAY  30 tablet  10  . Multiple Vitamins-Minerals (CENTRUM SILVER PO) Take 1 tablet by mouth daily.        .  ondansetron (ZOFRAN) 4 MG tablet Take 1 tablet (4 mg total) by mouth every 4 (four) hours as needed for nausea.  20 tablet  6  . sertraline (ZOLOFT) 100 MG tablet TAKE ONE TABLET BY MOUTH DAILY  30 tablet  6  . traMADol (ULTRAM) 50 MG tablet TAKE 1 TABLET BY MOUTH 3 TIMES A DAY AS NEEDED FOR PAIN  90 tablet  8  . triamcinolone (KENALOG) 0.025 % cream Apply 1 application topically 2 (two) times daily.        . vitamin E 400 UNIT capsule Take 400 Units by mouth daily.        . [DISCONTINUED] lansoprazole (PREVACID) 15 MG capsule Take 15 mg by mouth 2 (two) times daily.         No Known Allergies   Current Medications, Allergies, Past Medical History, Past Surgical History, Family History, and Social History were reviewed in [employer] record.    Review of Systems        See HPI - all other systems neg except as noted...  The patient complains of dyspnea on exertion and muscle weakness.  The patient denies anorexia, fever, weight loss, weight gain, vision loss, decreased hearing, hoarseness, chest pain, syncope, peripheral edema, prolonged cough, headaches, hemoptysis, abdominal pain, melena, hematochezia, severe indigestion/heartburn, hematuria, incontinence, suspicious skin lesions, transient blindness, difficulty walking, depression, unusual weight change, abnormal bleeding, enlarged lymph nodes, and angioedema.     Objective:   Physical Exam    WD, Thin, 78 y/o WF in NAD... she is chr ill apearing... GENERAL:  Alert & oriented; pleasant & cooperative... HEENT:  [place]/AT, EOM-wnl, PERRLA, EACs-clear, TMs-wnl, NOSE-clear, THROAT-clear & wnl. NECK:  Supple w/ fairROM; no JVD; normal carotid impulses w/o bruits; no thyromegaly or nodules palpated; no lymphadenopathy. CHEST:  Clear to P & A; prev left thoracotomy scar; without wheezes/ rales/ or rhonchi heard... HEART:  Regular Rhythm; without murmurs/ rubs/ or gallops detected... ABDOMEN:  Soft & nontender; normal bowel  sounds; no organomegaly or masses palpated... EXT: without deformities, mild arthritic changes; no varicose veins/ venous insuffic/ or edema. NEURO:  CN's intact; no focal neuro deficits x mild neuropathy... DERM:  No lesions noted; no rash etc...  RADIOLOGY DATA:  Reviewed in the EPIC EMR & discussed w/ the patient...  LABORATORY DATA:  Reviewed in the EPIC EMR & discussed w/ the patient...   Assessment & Plan:    DIZZINESS> Rx w/ Meclizine 25mg - 1/2 to 1 tab Q4H as needed... NAUSEA> Rx w/ Zofran 4mg  Q4H as needed.... WEIGHT LOSS/ ANOREXIA> on Megace 40mg /ml- 1 tsp Qid...  COPD>  On NEBS, Advair, Mucinex; continue same & cautiously incr activity; avoid pollen & get air filter for bedroom...  Hx Lung Cancer>  S/p LLLobectomy 1999 & no known recurrence; CXR 5/13 reviewed & chr changes, NAD...  GERD>  On OTC PPI Rx as needed; she denies swallowing difficulty  etc, just poor appetite; rec supplements...  IBS>  On Bentyl & Miralax as needed...  Hx Breast Cancer>  Left breast ca w/ surg 2003, then Tamoxifen & released by DrMagrinat in 2008; no known recurrence to date...  DJD>  Fall at home 6/12 w/ Fx rt arm & patella; attended by DrDuda & improved toward baseline; she uses Tylenol/ Tramadol for pain...  Osteoporosis>  Prev on Boniva, but she received her 1st dose of IV Reclast 05/07/12...  Neuro> Dementia, Neuropathy>  On Neurontin, we [facility]...  Anxiety>  She remains quite anxious & well cared for by her daughter; continue same meds...  Anemia>  Hg now improved to 13.2.Marland KitchenMarland Kitchen    Patient's Medications  New Prescriptions   No medications on file  Previous Medications   ADVAIR DISKUS 250-50 MCG/DOSE AEPB    INHALE 1 PUFF TWICE A DAY   ALBUTEROL (PROVENTIL) (2.5 MG/3ML) 0.083% NEBULIZER SOLUTION    Use 1 vial in nebulizer up to 3 times daily as needed FILE UNDER MCR PART B   CALCIUM CARBONATE (OS-CAL) 600 MG TABS    Take 600 mg by mouth 2 (two) times daily  with a meal.     CHLORDIAZEPOXIDE (LIBRIUM) 10 MG CAPSULE    Take 1 capsule (10 mg total) by mouth 3 (three) times daily as needed for anxiety.   CHOLECALCIFEROL (VITAMIN D) 1000 UNITS TABLET    Take 1,000 Units by mouth daily.     CYANOCOBALAMIN 2000 MCG TABLET    Take 2,000 mcg by mouth daily.     DICYCLOMINE (BENTYL) 20 MG TABLET    TAKE 1 TABLET BY MOUTH 3 TIMES A DAY AS NEEDED FOR CRAMPING   GABAPENTIN (NEURONTIN) 300 MG CAPSULE    TAKE ONE CAPSULE BY MOUTH 3 TIMES A DAY   IBANDRONATE (BONIVA) 150 MG TABLET    TAKE 1 TABLET BY MOUTH EVERY MONTH   MECLIZINE (ANTIVERT) 25 MG TABLET    Take 1/2 to 1 tablet by mouth every 4 hours as needed for dizziness   MEGESTROL (MEGACE ORAL) 40 MG/ML SUSPENSION    Take 1 teaspoonful by mouth four times daily   MIRTAZAPINE (REMERON) 15 MG TABLET    TAKE 1 TABLET EVERY DAY   MULTIPLE VITAMINS-MINERALS (CENTRUM SILVER PO)    Take 1 tablet by mouth daily.     ONDANSETRON (ZOFRAN) 4 MG TABLET    Take 1 tablet (4 mg total) by mouth every 4 (four) hours as needed for nausea.   SERTRALINE (ZOLOFT) 100 MG TABLET    TAKE ONE TABLET BY MOUTH DAILY   TRAMADOL (ULTRAM) 50 MG TABLET    TAKE 1 TABLET BY MOUTH 3 TIMES A DAY AS NEEDED FOR PAIN   TRIAMCINOLONE (KENALOG) 0.025 % CREAM    Apply 1 application topically 2 (two) times daily.     VITAMIN E 400 UNIT CAPSULE    Take 400 Units by mouth daily.    Modified Medications   Modified Medication Previous Medication   LANSOPRAZOLE (PREVACID) 15 MG CAPSULE lansoprazole (PREVACID) 15 MG capsule      Take 1 capsule (15 mg total) by mouth 2 (two) times daily.    Take 15 mg by mouth 2 (two) times daily.   Discontinued Medications   No medications on file

## 2012-10-21 ENCOUNTER — Other Ambulatory Visit: Payer: Self-pay | Admitting: Pulmonary Disease

## 2012-10-21 DIAGNOSIS — C50919 Malignant neoplasm of unspecified site of unspecified female breast: Secondary | ICD-10-CM

## 2013-01-04 ENCOUNTER — Telehealth: Payer: Self-pay | Admitting: Pulmonary Disease

## 2013-01-04 MED ORDER — TRAMADOL HCL 50 MG PO TABS: 50.0000 mg | ORAL_TABLET | Freq: Three times a day (TID) | ORAL | Status: DC | PRN

## 2013-01-04 NOTE — Telephone Encounter (Signed)
I spoke with pt. She requesting a refill on her tramadol. I have sent in RX. Nothing further was needed

## 2013-02-01 ENCOUNTER — Telehealth: Payer: Self-pay | Admitting: Pulmonary Disease

## 2013-02-01 NOTE — Telephone Encounter (Signed)
Pt called back.  She stated it's the Chlordiazepoxide rx she is needing. Leslie [name]

## 2013-02-01 NOTE — Telephone Encounter (Signed)
Spoke with pt .requesting rx for  Librium 10mg   Take 1 tablet tid #90 x4 remeron 15 mg take 1 tablet daily. #30 X 4 No Known Allergies Dr [name] please advise thank you

## 2013-02-02 MED ORDER — CHLORDIAZEPOXIDE HCL 10 MG PO CAPS: 10.0000 mg | ORAL_CAPSULE | Freq: Three times a day (TID) | ORAL | Status: DC | PRN

## 2013-02-02 MED ORDER — MIRTAZAPINE 15 MG PO TABS: 15.0000 mg | ORAL_TABLET | Freq: Every evening | ORAL | Status: DC | PRN

## 2013-02-02 NOTE — Telephone Encounter (Signed)
Refills sent. Pt is aware. Adonte Vanriper, CMA  

## 2013-02-02 NOTE — Telephone Encounter (Signed)
Per SN----  Librium  1 po tid prn nerves, not to exceed 3 per day remeron  1 po qhs prn sleep.  thanks

## 2013-02-16 ENCOUNTER — Telehealth: Payer: Self-pay | Admitting: Pulmonary Disease

## 2013-02-16 MED ORDER — SERTRALINE HCL 100 MG PO TABS: 100.0000 mg | ORAL_TABLET | Freq: Every day | ORAL | Status: DC

## 2013-02-16 NOTE — Telephone Encounter (Signed)
Rx has been sent to the pharmacy. Pt is aware.

## 2013-04-15 ENCOUNTER — Other Ambulatory Visit (INDEPENDENT_AMBULATORY_CARE_PROVIDER_SITE_OTHER): Payer: Medicare Other

## 2013-04-15 ENCOUNTER — Encounter: Payer: Self-pay | Admitting: Pulmonary Disease

## 2013-04-15 ENCOUNTER — Ambulatory Visit (INDEPENDENT_AMBULATORY_CARE_PROVIDER_SITE_OTHER): Payer: Medicare Other | Admitting: Pulmonary Disease

## 2013-04-15 ENCOUNTER — Ambulatory Visit (INDEPENDENT_AMBULATORY_CARE_PROVIDER_SITE_OTHER)
Admission: RE | Admit: 2013-04-15 | Discharge: 2013-04-15 | Disposition: A | Discharge status: Home / Self Care | Payer: Medicare Other | Source: Ambulatory Visit | Attending: Pulmonary Disease | Admitting: Pulmonary Disease

## 2013-04-15 VITALS — BP 100/64 | HR 77 | Temp 97.8°F | Ht 60.0 in | Wt 75.6 lb

## 2013-04-15 DIAGNOSIS — M810 Age-related osteoporosis without current pathological fracture: Secondary | ICD-10-CM

## 2013-04-15 DIAGNOSIS — C50919 Malignant neoplasm of unspecified site of unspecified female breast: Secondary | ICD-10-CM

## 2013-04-15 DIAGNOSIS — J449 Chronic obstructive pulmonary disease, unspecified: Secondary | ICD-10-CM

## 2013-04-15 DIAGNOSIS — L408 Other psoriasis: Secondary | ICD-10-CM

## 2013-04-15 DIAGNOSIS — C3490 Malignant neoplasm of unspecified part of unspecified bronchus or lung: Secondary | ICD-10-CM

## 2013-04-15 DIAGNOSIS — K219 Gastro-esophageal reflux disease without esophagitis: Secondary | ICD-10-CM

## 2013-04-15 DIAGNOSIS — M1990 Unspecified osteoarthritis, unspecified site: Secondary | ICD-10-CM

## 2013-04-15 DIAGNOSIS — K589 Irritable bowel syndrome without diarrhea: Secondary | ICD-10-CM

## 2013-04-15 DIAGNOSIS — F411 Generalized anxiety disorder: Secondary | ICD-10-CM

## 2013-04-15 DIAGNOSIS — E559 Vitamin D deficiency, unspecified: Secondary | ICD-10-CM

## 2013-04-15 DIAGNOSIS — G589 Mononeuropathy, unspecified: Secondary | ICD-10-CM

## 2013-04-15 DIAGNOSIS — R413 Other amnesia: Secondary | ICD-10-CM

## 2013-04-15 LAB — BASIC METABOLIC PANEL
CO2: 27 mEq/L (ref 19–32)
Chloride: 101 mEq/L (ref 96–112)
Glucose, Bld: 85 mg/dL (ref 70–99)
Potassium: 4.1 mEq/L (ref 3.5–5.1)
Sodium: 138 mEq/L (ref 135–145)

## 2013-04-15 LAB — CBC WITH DIFFERENTIAL/PLATELET
Basophils Absolute: 0.1 10*3/uL (ref 0.0–0.1)
Eosinophils Absolute: 0.2 10*3/uL (ref 0.0–0.7)
Hemoglobin: 13.9 g/dL (ref 12.0–15.0)
Lymphocytes Relative: 27.6 % (ref 12.0–46.0)
MCHC: 33.6 g/dL (ref 30.0–36.0)
Neutro Abs: 7.3 10*3/uL (ref 1.4–7.7)
Platelets: 268 10*3/uL (ref 150.0–400.0)
RDW: 16.2 % — ABNORMAL HIGH (ref 11.5–14.6)

## 2013-04-15 LAB — HEPATIC FUNCTION PANEL
ALT: 16 U/L (ref 0–35)
AST: 26 U/L (ref 0–37)
Alkaline Phosphatase: 92 U/L (ref 39–117)
Bilirubin, Direct: 0 mg/dL (ref 0.0–0.3)
Total Protein: 7.3 g/dL (ref 6.0–8.3)

## 2013-04-15 LAB — TSH: TSH: 2.3 u[IU]/mL (ref 0.35–5.50)

## 2013-04-15 MED ORDER — ONDANSETRON HCL 4 MG PO TABS: 4.0000 mg | ORAL_TABLET | ORAL | Status: DC | PRN

## 2013-04-15 MED ORDER — TRIAMCINOLONE ACETONIDE 0.1 % EX CREA: 1.0000 "application " | TOPICAL_CREAM | Freq: Two times a day (BID) | CUTANEOUS | Status: DC

## 2013-04-15 NOTE — Progress Notes (Signed)
Subjective:    Patient ID: Nell Range, female    DOB: 21-May-1929, 79 y.o.   MRN: [id number]  HPI 79 y/o WF here for a 6 month follow up visit... she has mult medical problems as listed...   ~  October 07, 2011:  279mo ROV & she has mult somatic complaints: dizzy in the AM (she refuses Neuro consult), pruritic rash on legs, poor appetite, weight stable ~80#, etc;  She requests flu shot & handicap sticker signed today...  See prob list below:    She fell at home 6/12 & was [facility] w/ fx right humerus, right patella, (seen by DrDuda for Ortho) & required 79mo in the NH for rehab [facility] records reviewed today); now improved back to baseline...  meds reviewed.  ~  Apr 09, 2012:  26mo ROV & she is c/o feeling bad, more SOB, all due to the pollen she thinks; on NEBS, Advair, & Albut HFA rescue inhaler; reminded to use NEBS 2-4x daily & the Advair Bid every day (the rescue inhaler is just for prn use when out & about); asked to get air filter at home to decr pollen exposure...    She has DJD & LBP w/ prev surg; she fell 6/12 w/ fx right humerus & patella- Rx DrDuda; she has been on Boniva for her severe Osteoporosis & wants to stop this & switch to IV med- we will see if she is elig & cost of RECLAST for her...  CXR 5/13 showed stable post-op changes/ vol loss/ scarring on left, atherosclerotic calcif in Ao, COPD changes, NAD... LABS 5/13:  Chems- wnl;  CBC- wnl;  TSH=2.45;  VitD=61;  VitB12>1500...  ~  July 23, 2012:  11mo ROV & add-on appt for dizziness> they report onset of ?dizziness & nausea w/o vomit yest x1d; pt states "just liked to fall- I can't explain it, it scared me";  She denies focal weakness, facial changes, slurred speech, or sensory changes;  She denies CP, palpit, syncope, etc;  She has hx of "inner ear" w/ similar symptoms yrs ago;  Family wondered if symptoms could come from poor appetite & poor intake, perhaps hypoglycemia as she seemed to do better after Boost...    We discussed Rx w/  Meclizine prn & Zofran for the nausea;  She did not want to pursue Cardiac arrhythmia or Neuro evaluations at this point...    Family did request appetite stimulant & we wrote for MEGACE 40mg /ml> 1 tsp po Qid for appetite; plus 6 sm meals daily (may use Ensure/ Boost betw N/ L/ D & bedtime...  ~  October 15, 2012:  11mo ROV & Brendan has gained 2# to 82# today taking Megace but just 1tsp daily (rec to incr to Qid as prescribed); she also notes that Meclizine has helped dizziness, & Zofran helped the nausea; she wants to incr her Prevacid 15mg  to Bid- ok...    COPD, Hx LungCa, s/p LLLobectomy 1999> on Advair250, NEBS, Mucinex; CXR 5/13 w/ chr post op changes, NAD...    GERD> on Prev15; wants to incr to Bid as above- ok...    IBS w/ Constip> on Bentyl & Miralax but won't take it regularly as advised...    Hx Breast Ca> she has been off Tamoxifen since 2008; she needs to get her f/u mammogram at [facility]...    DJD, Osteoporosis> s/p LLam, left hip fx, right humerus fx, right patella fx, & severe DDD; Ortho per DrDuda; prev on boniva, now Recleast w/ infusion 5/13.Marland KitchenMarland Kitchen  Dementia, Neuropathy> CT w/ atrophy 7 chr sm vessel dis; on Neurontin 300Tid    Anxiety> hx Etoh in past, on Librium, Zoloft, Remeron...    Psoriasis> followed by DrFHouston... We reviewed prob list, meds, xrays and labs> see below for updates >>   ~  Apr 15, 2013:  53mo ROV & Dwana says she feels OK, appetite is fair & helped by Megace but her weight is down 5# to 76# at present (BMI=15); she is asked to take the Megace every day & incr her nutritional supplements to Tid as well, must gain some weight... We reviewed the following medical problems during today's office visit >>     COPD, Hx LungCa, s/p LLLobectomy 1999> on Advair250, NEBS, Mucinex; denies CP, palpit, SOB, edema; CXR 5/14 is stable w/ decr lung vol on left, scarring unchanged, NAD...     GERD> on Prev15Bid, Zofran4 prn; she still has intermit nausea that responds to Zofran;  needs to gain weight & this may help...    IBS w/ Constip> on Bentyl & Miralax but won't take it regularly as advised...    Underweight> she has never been large but BMI is 15-16 & needs to gain wt; she has MegaceQid & asked to incr nutritional supplements to Tid...    Hx Breast Ca> she has been off Tamoxifen since 2008; she needs to get her f/u mammogram at [facility] but she hasn't done this yet...    DJD, Osteoporosis> on Tramadol prn; s/p LLam, left hip fx, right humerus fx, right patella fx, & severe DDD; Ortho per Tyna Jaksch; prev on Boniva, now Reclast w/ infusion 5/13; BMD due now & next Reclast infusion pending...    Dementia, Neuropathy> CT w/ atrophy & chr sm vessel dis; on Neurontin 300Tid which helps her back pain she says...    Anxiety> hx Etoh in past, on Librium, Zoloft, Remeron; and her daughter helps w/ her meds...    Psoriasis> followed by DrFHouston (now retired) & she is requesting refill of Triamcinolone cream... We reviewed prob list, meds, xrays and labs> see below for updates >>  CXR 5/14 shows norm heart size, prior surg & sm lung vol w/ scarring on left, hyperinflation on right, NAD... LABS 5/14:  Chems- wnl;  CBC- wnl;  TSH=2.30;  VitD=51...  BMD 5/14=> pending          Problem List:      HEARING LOSS (ICD-389.9) - she refuses hearing eval...  DIZZINESS >> we started MECLIZINE 25mg - 1/2 to 1 tab every 4H as needed...  COPD (ICD-496) - ex-smoker... on ALBUT NEBS up to Qid, ADVAIR 250Bid, & Prn Mucinex... ~  CTChest 6/06 w/ stable post-op changes on left, no recurrence, scarring RLL, sm gallstones... ~  CXR 8/08 w/ vol loss & post-op changes on left, right clear...  ~  CXR 9/09 showed stable post op changes on the left, NAD.Marland Kitchen. ~  CXR 9/10 unchanged- stable post op appearance of left chest, NAD.Marland Kitchen. ~  CXR 8/11 showed post op changes & scarring, COPD, NAD.Marland Kitchen. ~  CXR 6/12 in [place] showed chr changes w/ scarring & blunt angle on left, hyperinflation on right, diffuse  osteopenia, NAD.Marland Kitchen. ~  CXR 5/13 showed stable post-op changes/ vol loss/ scarring on left, atherosclerotic calcif in Ao, COPD changes, NAD.Marland Kitchen. ~  CXR 5/14 shows norm heart size, prior surg & sm lung vol w/ scarring on left, hyperinflation on right, NAD...  Hx of CARCINOMA, LUNG, SQUAMOUS CELL (ICD-162.9) - s/p left thoracotomy w/ LLLobectomy for squamous  cell ca 10/99 by DrBurney... no known recurrence.  GERD (ICD-530.81) - prev on Prevacid OTC... last EGD was 11/99 showing GERD, otherw neg... 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  note: she states the Prevacid really helps and the generic Omeprazole didn't help... ~  8/13:  We added ZOFRAN 4mg  Q4H as needed for nausea... ~  11/13:  She requests to incr her PREVACID 15mg  to Bid- OK...  IBS- Constipation  >>  on BENTYL 20mg  prn...last colonoscopy was 7/03 by DrPerry & was normal- no pathologic findings... ~  5/13:  Asked to start Miralax 1 capful in water daily...  GALLSTONES (ICD-574.20)  Hx of ALCOHOLISM (ICD-303.90)  Hx of ADENOCARCINOMA, BREAST (ICD-174.9) - DrMagrinat stopped her Tamoxifen after 100yrs and released her on 12/08... she had a left breast lumpectomy and sentinel node biopsy 12/03 by [facility] for a 2.2cm infiltrating carcinoma, neg LN's, ER/PR pos, treated w/ XRT, then Tamoxifen for 85yrs... ~  Mammogram 5/09 was negative... ~  Mammogram 5/10 at [facility] was neg- fatty replacement, post-op changes... ~  She is overdue for f/u mammogram & will call [facility] at her convenience- reminded again...  DEGENERATIVE JOINT DISEASE (ICD-715.90) - she's had a prev lumbar laminectomy & a fractured left hip after a fall (repaired by DrDuda [id number])... ~  [facility] CTNeck 6/12 after fall showed severe DDD & facet dis, and anterolisthesis at several levels... ~  Fall at home 6/12 w/ fx right humerus & fx right patella> treated by DrDuda in hosp & NH  rehab...  OSTEOPOROSIS (ICD-733.00) - she was prev on Boniva150mg /month along w/ calcium and vitD supplements==> given IV RECLAST 05/07/12. ~  BMD 8/08 shows severe osteoporosis w/ TScores -3.5 in the spine and -3.7 in the hip...  improved from 5/06 study! ~  Vit D level 3/09 = 30 & 50K/wk Rx started...  ~  Vit D level 9/09 = 66 & switched to 1000u OTC daily... ~  BMD 10/10 showed TScores -3.3 Spine, and -3.1 in right fem neck... sl improved from 2008, continue Rx, avoid trauma. ~  Labs in [facility] 6/12 included Vit D level = 68 ~  Labs 5/13 showed VitD level = 61 ~  5/13:  She does not want to take Boniva any longer & is asking for IV med alternative> we will look into RECLAST eligibility for her==> given 1st dose IV RECLAST 05/07/12... ~  BMD 5/14 ordered=> pending and then we will set up her next Reclast infusion...                        NEUROPATHY (ICD-355.9) - on NEURONTIN 300mg  Tid... it really helps her back pain.  Early Dementia >> ~  CT Brain 6/12 in ER after fall showed atrophy & chr sm vessel dis, NAD...  ANXIETY (ICD-300.00) - on LIBRIUM 10mg tid, ZOLOFT 100mg /d and REMERON 15mg Qhs... she wishes to continue all of these the same.  PSORIASIS (ICD-696.1) -  treated by Elnora Morrison w/ MTX- intol pills, prev on shots... ~  5/14:  She requests refill Triamcinolone cream now that Elnora Morrison has retired...  ANEMIA (ICD-285.9) - she is rec to take Women's MVI, Vit B12 1079mcg/d, Vit D 1000 u/d... ~  labs 3/09 showed Hg= 11.9 w/ Fe= 67... started Feosol 200mg /d... ~  labs 9/09 showed Hg= 13.3 w/ Fe= 84 ~  labs 9/10 showed Hg= 13.3, Fe= 111... pt stopped the Fe supplement. ~  labs 8/11 showed Hg= 12.9, Fe= 94 (23%sat), B12= 302 ~  Labs 6/12 in [place] showed Hg= 12.9==>10.4 ~  Labs 5/13 showed Hg= 13.3 ~  Labs 5/14 showed Hg= 13.9  HEALTH MAINTENANCE:  She takes a lot of Vitamins> B12, D, E, MVI, calcium etc... Plus nutritional supplements- Ensure, Boost, etc...   Past Surgical History   Procedure Laterality Date  . Lumbar laminectomy    . 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 Left lung surgery with lllobectomy for squamous cell cancer  09/1998    Dr. Edwyna Shell  . Left breast lumpectomy with sentinel lymph node biopsy    . Left femur fracture repaired  04/2004    Dr. Lajoyce Corners    Outpatient Encounter Prescriptions as of 04/15/2013  Medication Sig Dispense Refill  . ADVAIR DISKUS 250-50 MCG/DOSE AEPB INHALE 1 PUFF TWICE A DAY  60 each  6  . albuterol (PROVENTIL) (2.5 MG/3ML) 0.083% nebulizer solution Use 1 vial in nebulizer up to 3 times daily as needed FILE UNDER MCR PART B  90 vial  3  . calcium carbonate (OS-CAL) 600 MG TABS Take 600 mg by mouth 2 (two) times daily with a meal.        . chlordiazePOXIDE (LIBRIUM) 10 MG capsule Take 1 capsule (10 mg total) by mouth 3 (three) times daily as needed for anxiety.  90 capsule  3  . cholecalciferol (VITAMIN D) 1000 UNITS tablet Take 1,000 Units by mouth daily.        . cyanocobalamin 2000 MCG tablet Take 2,000 mcg by mouth daily.        Marland Kitchen dicyclomine (BENTYL) 20 MG tablet TAKE 1 TABLET BY MOUTH 3 TIMES A DAY AS NEEDED FOR CRAMPING  100 tablet  2  . gabapentin (NEURONTIN) 300 MG capsule TAKE ONE CAPSULE BY MOUTH 3 TIMES A DAY  90 capsule  5  . lansoprazole (PREVACID) 15 MG capsule Take 1 capsule (15 mg total) by mouth 2 (two) times daily.  60 capsule  11  . meclizine (ANTIVERT) 25 MG tablet Take 1/2 to 1 tablet by mouth every 4 hours as needed for dizziness  50 tablet  6  . megestrol (MEGACE ORAL) 40 MG/ML suspension Take 1 teaspoonful by mouth four times daily  600 mL  6  . mirtazapine (REMERON) 15 MG tablet Take 1 tablet (15 mg total) by mouth at bedtime as needed.  30 tablet  3  . Multiple Vitamins-Minerals (CENTRUM SILVER PO) Take 1 tablet by mouth daily.        . ondansetron (ZOFRAN) 4 MG tablet Take 1 tablet (4 mg total) by mouth every 4 (four) hours as needed for nausea.  20 tablet  6  . sertraline  (ZOLOFT) 100 MG tablet Take 1 tablet (100 mg total) by mouth daily.  30 tablet  6  . traMADol (ULTRAM) 50 MG tablet Take 1 tablet (50 mg total) by mouth 3 (three) times daily as needed for pain.  90 tablet  6  . triamcinolone (KENALOG) 0.025 % cream Apply 1 application topically 2 (two) times daily.        . vitamin E 400 UNIT capsule Take 400 Units by mouth daily.        . [DISCONTINUED] ibandronate (BONIVA) 150 MG tablet TAKE 1 TABLET BY MOUTH EVERY MONTH  1 tablet  3   No facility-administered encounter medications on file as of 04/15/2013.    No Known Allergies   Current Medications,  Allergies, Past Medical History, Past Surgical History, Family History, and Social History were reviewed in [employer] record.    Review of Systems        See HPI - all other systems neg except as noted...  The patient complains of dyspnea on exertion and muscle weakness.  The patient denies anorexia, fever, weight loss, weight gain, vision loss, decreased hearing, hoarseness, chest pain, syncope, peripheral edema, prolonged cough, headaches, hemoptysis, abdominal pain, melena, hematochezia, severe indigestion/heartburn, hematuria, incontinence, suspicious skin lesions, transient blindness, difficulty walking, depression, unusual weight change, abnormal bleeding, enlarged lymph nodes, and angioedema.     Objective:   Physical Exam    WD, Thin, 79 y/o WF in NAD... she is chr ill appearing... GENERAL:  Alert & oriented; pleasant & cooperative... HEENT:  [place]/AT, EOM-wnl, PERRLA, EACs-clear, TMs-wnl, NOSE-clear, THROAT-clear & wnl. NECK:  Supple w/ fairROM; no JVD; normal carotid impulses w/o bruits; no thyromegaly or nodules palpated; no lymphadenopathy. CHEST:  Decr BS on left; prev left thoracotomy scar; without wheezes/ rales/ or rhonchi heard... HEART:  Regular Rhythm; without murmurs/ rubs/ or gallops detected... ABDOMEN:  Soft & nontender; normal bowel sounds; no organomegaly or  masses palpated... EXT: without deformities, mild arthritic changes; no varicose veins/ venous insuffic/ or edema. NEURO:  CN's intact; no focal neuro deficits x mild neuropathy... DERM:  No lesions noted; no rash etc...  RADIOLOGY DATA:  Reviewed in the EPIC EMR & discussed w/ the patient...  LABORATORY DATA:  Reviewed in the EPIC EMR & discussed w/ the patient...   Assessment & Plan:    COPD>  On NEBS, Advair, Mucinex; continue same & cautiously incr activity; avoid pollen & get air filter for bedroom...  Hx Lung Cancer>  S/p LLLobectomy 1999 & no known recurrence; CXR 5/13 reviewed & chr changes, NAD...  GERD>  On OTC PPI Rx as needed; she denies swallowing difficulty etc, just poor appetite; rec supplements...  IBS>  On Bentyl & Miralax as needed...  Hx Breast Cancer>  Left breast ca w/ surg 2003, then Tamoxifen & released by DrMagrinat in 2008; no known recurrence to date...  DJD>  Fall at home 6/12 w/ Fx rt arm & patella; attended by DrDuda & improved toward baseline; she uses Tylenol/ Tramadol for pain...  Osteoporosis>  Prev on Boniva, but she received her 1st dose of IV Reclast 05/07/12...  Neuro> Dementia, Neuropathy>  On Neurontin, we reviewed [facility]Brain [facility]...  Anxiety>  She remains quite anxious & well cared for by her daughter; continue same meds...  Anemia>  Hg now improved to 13.9.Marland KitchenMarland Kitchen    Patient's Medications  New Prescriptions   No medications on file  Previous Medications   ADVAIR DISKUS 250-50 MCG/DOSE AEPB    INHALE 1 PUFF TWICE A DAY   ALBUTEROL (PROVENTIL) (2.5 MG/3ML) 0.083% NEBULIZER SOLUTION    Use 1 vial in nebulizer up to 3 times daily as needed FILE UNDER MCR PART B   CALCIUM CARBONATE (OS-CAL) 600 MG TABS    Take 600 mg by mouth 2 (two) times daily with a meal.     CHLORDIAZEPOXIDE (LIBRIUM) 10 MG CAPSULE    Take 1 capsule (10 mg total) by mouth 3 (three) times daily as needed for anxiety.   CHOLECALCIFEROL (VITAMIN D) 1000 UNITS TABLET     Take 1,000 Units by mouth daily.     CYANOCOBALAMIN 2000 MCG TABLET    Take 2,000 mcg by mouth daily.     DICYCLOMINE (BENTYL) 20  MG TABLET    TAKE 1 TABLET BY MOUTH 3 TIMES A DAY AS NEEDED FOR CRAMPING   GABAPENTIN (NEURONTIN) 300 MG CAPSULE    TAKE ONE CAPSULE BY MOUTH 3 TIMES A DAY   LANSOPRAZOLE (PREVACID) 15 MG CAPSULE    Take 1 capsule (15 mg total) by mouth 2 (two) times daily.   MECLIZINE (ANTIVERT) 25 MG TABLET    Take 1/2 to 1 tablet by mouth every 4 hours as needed for dizziness   MEGESTROL (MEGACE ORAL) 40 MG/ML SUSPENSION    Take 1 teaspoonful by mouth four times daily   MIRTAZAPINE (REMERON) 15 MG TABLET    Take 1 tablet (15 mg total) by mouth at bedtime as needed.   MULTIPLE VITAMINS-MINERALS (CENTRUM SILVER PO)    Take 1 tablet by mouth daily.     SERTRALINE (ZOLOFT) 100 MG TABLET    Take 1 tablet (100 mg total) by mouth daily.   TRAMADOL (ULTRAM) 50 MG TABLET    Take 1 tablet (50 mg total) by mouth 3 (three) times daily as needed for pain.   VITAMIN E 400 UNIT CAPSULE    Take 400 Units by mouth daily.    Modified Medications   Modified Medication Previous Medication   ONDANSETRON (ZOFRAN) 4 MG TABLET ondansetron (ZOFRAN) 4 MG tablet      Take 1 tablet (4 mg total) by mouth every 4 (four) hours as needed for nausea.    Take 1 tablet (4 mg total) by mouth every 4 (four) hours as needed for nausea.   TRIAMCINOLONE CREAM (KENALOG) 0.1 % triamcinolone cream (KENALOG) 0.1 %      Apply 1 application topically 2 (two) times daily. To affected area    Apply 1 application topically 2 (two) times daily. To affected area  Discontinued Medications   IBANDRONATE (BONIVA) 150 MG TABLET    TAKE 1 TABLET BY MOUTH EVERY MONTH   TRIAMCINOLONE (KENALOG) 0.025 % CREAM    Apply 1 application topically 2 (two) times daily.

## 2013-04-20 ENCOUNTER — Ambulatory Visit (INDEPENDENT_AMBULATORY_CARE_PROVIDER_SITE_OTHER)
Admission: RE | Admit: 2013-04-20 | Discharge: 2013-04-20 | Disposition: A | Discharge status: Home / Self Care | Payer: Medicare Other | Source: Ambulatory Visit | Attending: Pulmonary Disease | Admitting: Pulmonary Disease

## 2013-04-20 ENCOUNTER — Other Ambulatory Visit: Payer: Self-pay | Admitting: Pulmonary Disease

## 2013-04-20 DIAGNOSIS — M810 Age-related osteoporosis without current pathological fracture: Secondary | ICD-10-CM

## 2013-04-20 DIAGNOSIS — M1990 Unspecified osteoarthritis, unspecified site: Secondary | ICD-10-CM

## 2013-04-21 ENCOUNTER — Telehealth: Payer: Self-pay | Admitting: Pulmonary Disease

## 2013-04-21 MED ORDER — FLUTICASONE-SALMETEROL 250-50 MCG/DOSE IN AEPB: INHALATION_SPRAY | RESPIRATORY_TRACT | Status: AC

## 2013-04-21 NOTE — Telephone Encounter (Signed)
Rx was refilled Dtr aware

## 2013-04-22 ENCOUNTER — Encounter: Payer: Self-pay | Admitting: Adult Health

## 2013-04-26 ENCOUNTER — Telehealth: Payer: Self-pay | Admitting: Pulmonary Disease

## 2013-04-26 NOTE — Telephone Encounter (Signed)
Notes Recorded by Marcellus Scott, CMA on 04/26/2013 at 12:02 PM lmomtcb for paula--pts daughter to discuss results ------  Notes Recorded by [name], MD on 04/26/2013 at 8:10 AM Please notify patient>  BMD w/ severe osteoporosis- needs to proceed w/ RECLAST 5mg  IV...               I spoke with the pt daughter and advised of results. Carron Curie, CMA

## 2013-04-29 ENCOUNTER — Telehealth: Payer: Self-pay | Admitting: Pulmonary Disease

## 2013-04-29 MED ORDER — TRIAMCINOLONE ACETONIDE 0.1 % EX CREA: 1.0000 "application " | TOPICAL_CREAM | Freq: Two times a day (BID) | CUTANEOUS | Status: DC

## 2013-04-29 NOTE — Telephone Encounter (Signed)
Spoke with pt the smaller tubes of kenalog doesn't last long. Called in rx for a jar of kenalog  0.1% apply as directed # [id number]

## 2013-05-13 ENCOUNTER — Encounter (HOSPITAL_COMMUNITY): Payer: Self-pay

## 2013-05-13 ENCOUNTER — Other Ambulatory Visit (HOSPITAL_COMMUNITY): Payer: Self-pay | Admitting: Adult Health

## 2013-05-13 ENCOUNTER — Encounter (HOSPITAL_COMMUNITY)
Admission: RE | Admit: 2013-05-13 | Discharge: 2013-05-13 | Disposition: A | Discharge status: Home / Self Care | Payer: Medicare Other | Source: Ambulatory Visit | Attending: Pulmonary Disease | Admitting: Pulmonary Disease

## 2013-05-13 DIAGNOSIS — M810 Age-related osteoporosis without current pathological fracture: Secondary | ICD-10-CM | POA: Insufficient documentation

## 2013-05-13 MED ORDER — ZOLEDRONIC ACID 5 MG/100ML IV SOLN
5.0000 mg | Freq: Once | INTRAVENOUS | Status: AC
  Administered 2013-05-13: 5 mg via INTRAVENOUS
  Filled 2013-05-13: qty 100

## 2013-05-13 MED ORDER — SODIUM CHLORIDE 0.9 % IV SOLN
Freq: Once | INTRAVENOUS | Status: AC
  Administered 2013-05-13: 250 mL via INTRAVENOUS

## 2013-06-02 ENCOUNTER — Telehealth: Payer: Self-pay | Admitting: Pulmonary Disease

## 2013-06-02 MED ORDER — CHLORDIAZEPOXIDE HCL 10 MG PO CAPS: 10.0000 mg | ORAL_CAPSULE | Freq: Three times a day (TID) | ORAL | Status: DC | PRN

## 2013-06-02 NOTE — Telephone Encounter (Signed)
LMTCB

## 2013-06-02 NOTE — Telephone Encounter (Signed)
Called and lmom to make the daughter paula aware that the rx has been sent in for the pt.  Pt has upcoming appt with SN.

## 2013-06-02 NOTE — Telephone Encounter (Signed)
Pt returned call. Leslie [name] °

## 2013-07-13 ENCOUNTER — Ambulatory Visit: Payer: Medicare Other | Admitting: Pulmonary Disease

## 2013-07-14 ENCOUNTER — Telehealth: Payer: Self-pay | Admitting: Pulmonary Disease

## 2013-07-14 NOTE — Telephone Encounter (Signed)
LMTCB x1 for daughter

## 2013-07-14 NOTE — Telephone Encounter (Signed)
Pt's daughter returned the call. Leslie [name]

## 2013-07-14 NOTE — Telephone Encounter (Signed)
lmtcb x1 for daughter

## 2013-07-14 NOTE — Telephone Encounter (Signed)
Daughter returned call. Kathleen W Perdue  °

## 2013-07-14 NOTE — Telephone Encounter (Signed)
Called and spoke with [facility] and they are filling the tramadol today for #10---this is what the insurance will cover.   They can refill the full amount next week for her.  The daughter stated that she will go by and pick up these 10 tablets  And she will keep a look out on the pts medications to make sure these are not being taken by family members.  Pts daughter is aware that the pt can try taking tylenol 325 mg  Tablet with the tramadol to help this medication.  She will try this to see if this works and she will call back if it does not.  Nothing further is needed at this time.

## 2013-07-29 ENCOUNTER — Other Ambulatory Visit: Payer: Self-pay | Admitting: Pulmonary Disease

## 2013-07-29 MED ORDER — MIRTAZAPINE 15 MG PO TABS: 15.0000 mg | ORAL_TABLET | Freq: Every evening | ORAL | Status: AC | PRN

## 2013-08-02 ENCOUNTER — Telehealth: Payer: Self-pay | Admitting: Pulmonary Disease

## 2013-08-02 NOTE — Telephone Encounter (Signed)
I spoke with the pt and advised she needs appt. She states she will have to call her daughter and callus back. Carron Curie, CMA

## 2013-08-02 NOTE — Telephone Encounter (Signed)
Pt called back and states she can get a ride in teh morning by her son. Appt set with TP for 11:15. Carron Curie, CMA

## 2013-08-02 NOTE — Telephone Encounter (Signed)
Per SN---  Are all the sores on one side?  Shingles?  Needs to be seen by someone---either here in our office, ER or [place].  thanks

## 2013-08-02 NOTE — Telephone Encounter (Signed)
Spoke with the pt She is c/o sore that started in the corner of her mouth, and has now radiated down her chin She states sort of looks like a fever blister She states that is is not very painful, but has been there for approx 2 wks now and is starting to bother her I offered ov for eval, but she declined, due to no transportation Please advise thanks! No Known Allergies

## 2013-08-03 ENCOUNTER — Ambulatory Visit (INDEPENDENT_AMBULATORY_CARE_PROVIDER_SITE_OTHER): Payer: Medicare Other | Admitting: Adult Health

## 2013-08-03 ENCOUNTER — Encounter: Payer: Self-pay | Admitting: Adult Health

## 2013-08-03 VITALS — BP 136/84 | HR 74 | Temp 97.9°F | Ht 60.0 in | Wt 72.6 lb

## 2013-08-03 DIAGNOSIS — K130 Diseases of lips: Secondary | ICD-10-CM

## 2013-08-03 NOTE — Patient Instructions (Addendum)
Apply clortrimazole cream Twice daily  At corners at lips for 1 week  Avoid excessive moisture -drooling/saliva if possible  Place vaseline several times a day on lips and around lips to provide a barrier.  Please contact office for sooner follow up if symptoms do not improve or worsen or seek emergency care

## 2013-08-04 DIAGNOSIS — K130 Diseases of lips: Secondary | ICD-10-CM | POA: Insufficient documentation

## 2013-08-04 NOTE — Assessment & Plan Note (Signed)
Apply clortrimazole cream Twice daily  At corners at lips for 1 week  Avoid excessive moisture -drooling/saliva if possible  Place vaseline several times a day on lips and around lips to provide a barrier.  Please contact office for sooner follow up if symptoms do not improve or worsen or seek emergency care   

## 2013-08-04 NOTE — Progress Notes (Signed)
Subjective:    Patient ID: Leslie [name], female    DOB: [birth year], 79 y.o.   MRN: [id number]  HPI 79 y/o WF   08/03/13 Acute OV  Complains of sores at corners of mouth with redness x1 month, worse x1 week.  believes it may be yeast. Can not wear dentures well. Has a lot of saliva draining at times.  No blisters, fever, or new meds. Has been using chapstick.          Problem List:      HEARING LOSS (ICD-389.9) - she refuses hearing eval...  DIZZINESS >> we started MECLIZINE 25mg - 1/2 to 1 tab every 4H as needed...  COPD (ICD-496) - ex-smoker... on ALBUT NEBS up to Qid, ADVAIR 250Bid, & Prn Mucinex... ~  CTChest 6/06 w/ stable post-op changes on left, no recurrence, scarring RLL, sm gallstones... ~  CXR 8/08 w/ vol loss & post-op changes on left, right clear...  ~  CXR 9/09 showed stable post op changes on the left, NAD.Marland Kitchen. ~  CXR 9/10 unchanged- stable post op appearance of left chest, NAD.Marland Kitchen. ~  CXR 8/11 showed post op changes & scarring, COPD, NAD.Marland Kitchen. ~  CXR 6/12 in [place] showed chr changes w/ scarring & blunt angle on left, hyperinflation on right, diffuse osteopenia, NAD.Marland Kitchen. ~  CXR 5/13 showed stable post-op changes/ vol loss/ scarring on left, atherosclerotic calcif in Ao, COPD changes, NAD.Marland Kitchen. ~  CXR 5/14 shows norm heart size, prior surg & sm lung vol w/ scarring on left, hyperinflation on right, NAD...  Hx of CARCINOMA, LUNG, SQUAMOUS CELL (ICD-162.9) - s/p left thoracotomy w/ LLLobectomy for squamous cell ca 10/99 by DrBurney... no known recurrence.  GERD (ICD-530.81) - prev on Prevacid OTC... last EGD was 11/99 showing GERD, otherw neg... 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  note: she states the Prevacid really helps and the generic Omeprazole didn't help... ~  8/13:  We added ZOFRAN 4mg  Q4H as needed for nausea... ~  11/13:  She requests to incr her PREVACID 15mg  to Bid- OK...  IBS-  Constipation  >>  on BENTYL 20mg  prn...last colonoscopy was 7/03 by DrPerry & was normal- no pathologic findings... ~  5/13:  Asked to start Miralax 1 capful in water daily...  GALLSTONES (ICD-574.20)  Hx of ALCOHOLISM (ICD-303.90)  Hx of ADENOCARCINOMA, BREAST (ICD-174.9) - DrMagrinat stopped her Tamoxifen after 79yrs and released her on 12/08... she had a left breast lumpectomy and sentinel node biopsy 12/03 by [facility] for a 2.2cm infiltrating carcinoma, neg LN's, ER/PR pos, treated w/ XRT, then Tamoxifen for 24yrs... ~  Mammogram 5/09 was negative... ~  Mammogram 5/10 at [facility] was neg- fatty replacement, post-op changes... ~  She is overdue for f/u mammogram & will call [facility] at her convenience- reminded again...  DEGENERATIVE JOINT DISEASE (ICD-715.90) - she's had a prev lumbar laminectomy & a fractured left hip after a fall (repaired by DrDuda [id number])... ~  [facility] CTNeck 6/12 after fall showed severe DDD & facet dis, and anterolisthesis at several levels... ~  Fall at home 6/12 w/ fx right humerus & fx right patella> treated by DrDuda in hosp & NH rehab...  OSTEOPOROSIS (ICD-733.00) - she was prev on Boniva150mg /month along w/ calcium and vitD supplements==> given IV RECLAST 05/07/12. ~  BMD 8/08 shows severe osteoporosis w/ TScores -3.5 in the spine and -3.7 in the hip...  improved from 5/06 study! ~  Vit D  level 3/09 = 30 & 50K/wk Rx started...  ~  Vit D level 9/09 = 66 & switched to 1000u OTC daily... ~  BMD 10/10 showed TScores -3.3 Spine, and -3.1 in right fem neck... sl improved from 2008, continue Rx, avoid trauma. ~  Labs in [facility] 6/12 included Vit D level = 68 ~  Labs 5/13 showed VitD level = 61 ~  5/13:  She does not want to take Boniva any longer & is asking for IV med alternative> we will look into RECLAST eligibility for her==> given 1st dose IV RECLAST 05/07/12... ~  BMD 5/14 ordered=> pending and then we will set up her next Reclast infusion...                         NEUROPATHY (ICD-355.9) - on NEURONTIN 300mg  Tid... it really helps her back pain.  Early Dementia >> ~  CT Brain 6/12 in ER after fall showed atrophy & chr sm vessel dis, NAD...  ANXIETY (ICD-300.00) - on LIBRIUM 10mg tid, ZOLOFT 100mg /d and REMERON 15mg Qhs... she wishes to continue all of these the same.  PSORIASIS (ICD-696.1) -  treated by Elnora Morrison w/ MTX- intol pills, prev on shots... ~  5/14:  She requests refill Triamcinolone cream now that Elnora Morrison has retired...  ANEMIA (ICD-285.9) - she is rec to take Women's MVI, Vit B12 1057mcg/d, Vit D 1000 u/d... ~  labs 3/09 showed Hg= 11.9 w/ Fe= 67... started Feosol 200mg /d... ~  labs 9/09 showed Hg= 13.3 w/ Fe= 84 ~  labs 9/10 showed Hg= 13.3, Fe= 111... pt stopped the Fe supplement. ~  labs 8/11 showed Hg= 12.9, Fe= 94 (23%sat), B12= 302 ~  Labs 6/12 in [place] showed Hg= 12.9==>10.4 ~  Labs 5/13 showed Hg= 13.3 ~  Labs 5/14 showed Hg= 13.9  HEALTH MAINTENANCE:  She takes a lot of Vitamins> B12, D, E, MVI, calcium etc... Plus nutritional supplements- Ensure, Boost, etc...   Past Surgical History  Procedure Laterality Date  . Lumbar laminectomy    . 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 Left lung surgery with lllobectomy for squamous cell cancer  09/1998    Dr. Edwyna Shell  . Left breast lumpectomy with sentinel lymph node biopsy    . Left femur fracture repaired  04/2004    Dr. Lajoyce Corners    Outpatient Encounter Prescriptions as of 08/03/2013  Medication Sig Dispense Refill  . albuterol (PROVENTIL) (2.5 MG/3ML) 0.083% nebulizer solution Use 1 vial in nebulizer up to 3 times daily as needed FILE UNDER MCR PART B  90 vial  3  . calcium carbonate (OS-CAL) 600 MG TABS Take 600 mg by mouth 2 (two) times daily with a meal.        . chlordiazePOXIDE (LIBRIUM) 10 MG capsule Take 1 capsule (10 mg total) by mouth 3 (three) times daily as needed for anxiety.  90 capsule  3  . cholecalciferol (VITAMIN D) 1000 UNITS  tablet Take 1,000 Units by mouth daily.       . cyanocobalamin 2000 MCG tablet Take 2,000 mcg by mouth daily.        Marland Kitchen dicyclomine (BENTYL) 20 MG tablet TAKE 1 TABLET BY MOUTH 3 TIMES A DAY AS NEEDED FOR CRAMPING  100 tablet  2  . Fluticasone-Salmeterol (ADVAIR DISKUS) 250-50 MCG/DOSE AEPB INHALE 1 PUFF TWICE A DAY  60 each  11  . gabapentin (NEURONTIN) 300 MG capsule TAKE ONE CAPSULE BY MOUTH 3  TIMES A DAY  90 capsule  5  . lansoprazole (PREVACID) 15 MG capsule Take 1 capsule (15 mg total) by mouth 2 (two) times daily.  60 capsule  11  . meclizine (ANTIVERT) 25 MG tablet Take 1/2 to 1 tablet by mouth every 4 hours as needed for dizziness  50 tablet  6  . megestrol (MEGACE ORAL) 40 MG/ML suspension Take 1 teaspoonful by mouth four times daily  600 mL  6  . mirtazapine (REMERON) 15 MG tablet Take 1 tablet (15 mg total) by mouth at bedtime as needed.  30 tablet  5  . Multiple Vitamins-Minerals (CENTRUM SILVER PO) Take 1 tablet by mouth daily.        . ondansetron (ZOFRAN) 4 MG tablet Take 1 tablet (4 mg total) by mouth every 4 (four) hours as needed for nausea.  30 tablet  5  . sertraline (ZOLOFT) 100 MG tablet Take 1 tablet (100 mg total) by mouth daily.  30 tablet  6  . traMADol (ULTRAM) 50 MG tablet Take 1 tablet (50 mg total) by mouth 3 (three) times daily as needed for pain.  90 tablet  6  . triamcinolone cream (KENALOG) 0.1 % Apply 1 application topically 2 (two) times daily. To affected area  480 g  11  . vitamin E 400 UNIT capsule Take 400 Units by mouth daily.         No facility-administered encounter medications on file as of 08/03/2013.    No Known Allergies   Current Medications, Allergies, Past Medical History, Past Surgical History, Family History, and Social History were reviewed in [employer] record.    Review of Systems        See HPI - all other systems neg except as noted...  The patient complains of dyspnea on exertion and muscle weakness.  The  patient denies anorexia, fever, weight loss, weight gain, vision loss, decreased hearing, hoarseness, chest pain, syncope, peripheral edema, prolonged cough, headaches, hemoptysis, abdominal pain, melena, hematochezia, severe indigestion/heartburn, hematuria, incontinence, suspicious skin lesions, transient blindness, difficulty walking, depression, unusual weight change, abnormal bleeding, enlarged lymph nodes, and angioedema.     Objective:   Physical Exam    WD, Thin, 79 y/o WF in NAD... she is chr ill appearing... GENERAL:  Alert & oriented; pleasant & cooperative... HEENT:  [place]/AT, EOM-wnl, PERRLA, EACs-clear, TMs-wnl, NOSE-clear, THROAT-clear & wnl. Along corners of mouth with excoriated patches.  NECK:  Supple w/ fairROM; no JVD; normal carotid impulses w/o bruits; no thyromegaly or nodules palpated; no lymphadenopathy. CHEST:  Decr BS   without wheezes/ rales/ or rhonchi heard...  DERM:  No lesions noted; no rash etc...except as above     Assessment & Plan:

## 2013-08-05 ENCOUNTER — Telehealth: Payer: Self-pay | Admitting: Pulmonary Disease

## 2013-08-05 MED ORDER — GABAPENTIN 300 MG PO CAPS: ORAL_CAPSULE | ORAL | Status: DC

## 2013-08-05 NOTE — Telephone Encounter (Signed)
Refill sent,. Pt daughter is aware. Carron Curie, CMA

## 2013-08-12 ENCOUNTER — Telehealth: Payer: Self-pay | Admitting: Pulmonary Disease

## 2013-08-12 MED ORDER — FLUCONAZOLE 100 MG PO TABS: 100.0000 mg | ORAL_TABLET | Freq: Every day | ORAL | Status: DC

## 2013-08-12 NOTE — Telephone Encounter (Signed)
Pt's daughter calling again in ref to previous msg.Raylene Everts

## 2013-08-12 NOTE — Telephone Encounter (Signed)
Per SN---  Diflucan 100 mg  #7  1 daily rov with SN next week to recheck---9/12 at 10:30.  thanks

## 2013-08-12 NOTE — Telephone Encounter (Signed)
I spoke with daughter and she is aware of recs. She asked if I call [facility] instead of [facility]. I advised will do so. I called [facility] and gave VO. Nothing further needed

## 2013-08-12 NOTE — Telephone Encounter (Signed)
PT seen Tammy on 08/03/13 and was given Clotrimazole cream for places on lips and is also using Vaseline.  Places around lips are still no better and pt now has a place on the end of her nose that looks just like the places around her lips.  Pt's caregiver wants to know if pt should take a fungal pill or what else she can do for these places.  She also wonders if pt is not rinsing mouth good enough after using Advair although she reports that Tammy did not notice any signs of thrush at last ov.  Please advise.

## 2013-08-16 ENCOUNTER — Telehealth: Payer: Self-pay | Admitting: Pulmonary Disease

## 2013-08-16 DIAGNOSIS — K130 Diseases of lips: Secondary | ICD-10-CM

## 2013-08-16 NOTE — Telephone Encounter (Signed)
Pt was seen on 8-27 and for rash around mouth and was advised to do a cream and Vaseline. Pt daughter then called back on 08-12-13 stating it was not any better so they were prescribed diflucan. Pt daughter calls today and states that rash is a little worse. It is bright red and has blisters. She thinks it looks like impetigo. Pt has an appt on 08-19-13 but daughter wants to know if there is anything to do before then. Please advise. Carron Curie, CMA No Known Allergies

## 2013-08-16 NOTE — Telephone Encounter (Signed)
LMTCB for [employer]

## 2013-08-16 NOTE — Telephone Encounter (Signed)
Pt's daughter returned call. Holly D Pryor ° °

## 2013-08-16 NOTE — Telephone Encounter (Signed)
Dr. [name]  Wants her sent to dermatology ASAP  Can cancel ov on 9/12  Please contact office for sooner follow up if symptoms do not improve or worsen or seek emergency care

## 2013-08-16 NOTE — Telephone Encounter (Signed)
Spoke with [name] and notified of recs per TP She verbalized understanding  Order was sent to [facility] for urgent derm referral  Appt with SN was cancelled for 08/19/13

## 2013-08-19 ENCOUNTER — Ambulatory Visit: Payer: Medicare Other | Admitting: Pulmonary Disease

## 2013-08-19 ENCOUNTER — Telehealth: Payer: Self-pay | Admitting: Pulmonary Disease

## 2013-08-19 MED ORDER — TRAMADOL HCL 50 MG PO TABS
50.0000 mg | ORAL_TABLET | Freq: Three times a day (TID) | ORAL | Status: DC | PRN
Start: 2013-08-19 — End: 2013-10-03

## 2013-08-19 NOTE — Telephone Encounter (Signed)
Pt's daughter returned call & I advised [facility].  Pt's daughter verbalized understanding & states nothing further needed at this time.  Antionette Fairy

## 2013-08-19 NOTE — Telephone Encounter (Signed)
I have [facility] and left this on there VM as a pharmacists was not able to speak with me.  I called lmtcb x1 for daughter

## 2013-09-14 ENCOUNTER — Emergency Department (HOSPITAL_COMMUNITY): Payer: Medicare Other

## 2013-09-14 ENCOUNTER — Inpatient Hospital Stay (HOSPITAL_COMMUNITY)
Admission: EM | Admit: 2013-09-14 | Discharge: 2013-09-26 | DRG: 177 | Disposition: A | Discharge status: Skilled Nursing Facility | Payer: Medicare Other | Attending: Internal Medicine | Admitting: Internal Medicine

## 2013-09-14 ENCOUNTER — Encounter (HOSPITAL_COMMUNITY): Payer: Self-pay | Admitting: Emergency Medicine

## 2013-09-14 DIAGNOSIS — R0902 Hypoxemia: Secondary | ICD-10-CM | POA: Diagnosis present

## 2013-09-14 DIAGNOSIS — L408 Other psoriasis: Secondary | ICD-10-CM

## 2013-09-14 DIAGNOSIS — J449 Chronic obstructive pulmonary disease, unspecified: Secondary | ICD-10-CM | POA: Diagnosis present

## 2013-09-14 DIAGNOSIS — F1020 Alcohol dependence, uncomplicated: Secondary | ICD-10-CM

## 2013-09-14 DIAGNOSIS — R000 Tachycardia, unspecified: Secondary | ICD-10-CM | POA: Diagnosis present

## 2013-09-14 DIAGNOSIS — K130 Diseases of lips: Secondary | ICD-10-CM

## 2013-09-14 DIAGNOSIS — G894 Chronic pain syndrome: Secondary | ICD-10-CM

## 2013-09-14 DIAGNOSIS — J9601 Acute respiratory failure with hypoxia: Secondary | ICD-10-CM | POA: Diagnosis present

## 2013-09-14 DIAGNOSIS — F32A Depression, unspecified: Secondary | ICD-10-CM

## 2013-09-14 DIAGNOSIS — W1811XA Fall from or off toilet without subsequent striking against object, initial encounter: Secondary | ICD-10-CM | POA: Diagnosis present

## 2013-09-14 DIAGNOSIS — R634 Abnormal weight loss: Secondary | ICD-10-CM

## 2013-09-14 DIAGNOSIS — R413 Other amnesia: Secondary | ICD-10-CM

## 2013-09-14 DIAGNOSIS — Z538 Procedure and treatment not carried out for other reasons: Secondary | ICD-10-CM

## 2013-09-14 DIAGNOSIS — Z79899 Other long term (current) drug therapy: Secondary | ICD-10-CM

## 2013-09-14 DIAGNOSIS — M810 Age-related osteoporosis without current pathological fracture: Secondary | ICD-10-CM

## 2013-09-14 DIAGNOSIS — R64 Cachexia: Secondary | ICD-10-CM | POA: Diagnosis present

## 2013-09-14 DIAGNOSIS — G589 Mononeuropathy, unspecified: Secondary | ICD-10-CM

## 2013-09-14 DIAGNOSIS — G8929 Other chronic pain: Secondary | ICD-10-CM | POA: Diagnosis present

## 2013-09-14 DIAGNOSIS — K219 Gastro-esophageal reflux disease without esophagitis: Secondary | ICD-10-CM

## 2013-09-14 DIAGNOSIS — K589 Irritable bowel syndrome without diarrhea: Secondary | ICD-10-CM

## 2013-09-14 DIAGNOSIS — Z853 Personal history of malignant neoplasm of breast: Secondary | ICD-10-CM

## 2013-09-14 DIAGNOSIS — Z85118 Personal history of other malignant neoplasm of bronchus and lung: Secondary | ICD-10-CM

## 2013-09-14 DIAGNOSIS — H9190 Unspecified hearing loss, unspecified ear: Secondary | ICD-10-CM

## 2013-09-14 DIAGNOSIS — K8020 Calculus of gallbladder without cholecystitis without obstruction: Secondary | ICD-10-CM

## 2013-09-14 DIAGNOSIS — C3490 Malignant neoplasm of unspecified part of unspecified bronchus or lung: Secondary | ICD-10-CM

## 2013-09-14 DIAGNOSIS — Z681 Body mass index (BMI) 19 or less, adult: Secondary | ICD-10-CM

## 2013-09-14 DIAGNOSIS — F329 Major depressive disorder, single episode, unspecified: Secondary | ICD-10-CM | POA: Diagnosis present

## 2013-09-14 DIAGNOSIS — E43 Unspecified severe protein-calorie malnutrition: Secondary | ICD-10-CM

## 2013-09-14 DIAGNOSIS — J4489 Other specified chronic obstructive pulmonary disease: Secondary | ICD-10-CM

## 2013-09-14 DIAGNOSIS — E876 Hypokalemia: Secondary | ICD-10-CM

## 2013-09-14 DIAGNOSIS — I2789 Other specified pulmonary heart diseases: Secondary | ICD-10-CM | POA: Diagnosis present

## 2013-09-14 DIAGNOSIS — D638 Anemia in other chronic diseases classified elsewhere: Secondary | ICD-10-CM | POA: Diagnosis present

## 2013-09-14 DIAGNOSIS — Z791 Long term (current) use of non-steroidal anti-inflammatories (NSAID): Secondary | ICD-10-CM

## 2013-09-14 DIAGNOSIS — R4182 Altered mental status, unspecified: Secondary | ICD-10-CM

## 2013-09-14 DIAGNOSIS — D649 Anemia, unspecified: Secondary | ICD-10-CM

## 2013-09-14 DIAGNOSIS — C50919 Malignant neoplasm of unspecified site of unspecified female breast: Secondary | ICD-10-CM

## 2013-09-14 DIAGNOSIS — R300 Dysuria: Secondary | ICD-10-CM

## 2013-09-14 DIAGNOSIS — J90 Pleural effusion, not elsewhere classified: Secondary | ICD-10-CM | POA: Diagnosis present

## 2013-09-14 DIAGNOSIS — J151 Pneumonia due to Pseudomonas: Principal | ICD-10-CM

## 2013-09-14 DIAGNOSIS — F411 Generalized anxiety disorder: Secondary | ICD-10-CM

## 2013-09-14 DIAGNOSIS — Z923 Personal history of irradiation: Secondary | ICD-10-CM

## 2013-09-14 DIAGNOSIS — Z66 Do not resuscitate: Secondary | ICD-10-CM | POA: Diagnosis present

## 2013-09-14 DIAGNOSIS — Z87891 Personal history of nicotine dependence: Secondary | ICD-10-CM

## 2013-09-14 DIAGNOSIS — J9620 Acute and chronic respiratory failure, unspecified whether with hypoxia or hypercapnia: Secondary | ICD-10-CM | POA: Diagnosis present

## 2013-09-14 DIAGNOSIS — F3289 Other specified depressive episodes: Secondary | ICD-10-CM | POA: Diagnosis present

## 2013-09-14 DIAGNOSIS — J189 Pneumonia, unspecified organism: Secondary | ICD-10-CM

## 2013-09-14 DIAGNOSIS — M1990 Unspecified osteoarthritis, unspecified site: Secondary | ICD-10-CM

## 2013-09-14 LAB — CBC WITH DIFFERENTIAL/PLATELET
Basophils Absolute: 0 10*3/uL (ref 0.0–0.1)
Basophils Relative: 0 % (ref 0–1)
Eosinophils Absolute: 0 10*3/uL (ref 0.0–0.7)
Eosinophils Relative: 0 % (ref 0–5)
HCT: 31.9 % — ABNORMAL LOW (ref 36.0–46.0)
Hemoglobin: 10.7 g/dL — ABNORMAL LOW (ref 12.0–15.0)
MCH: 29.8 pg (ref 26.0–34.0)
MCHC: 33.5 g/dL (ref 30.0–36.0)
MCV: 88.9 fL (ref 78.0–100.0)
Monocytes Absolute: 2.2 10*3/uL — ABNORMAL HIGH (ref 0.1–1.0)
Monocytes Relative: 11 % (ref 3–12)
RDW: 16.1 % — ABNORMAL HIGH (ref 11.5–15.5)
WBC: 20.6 10*3/uL — ABNORMAL HIGH (ref 4.0–10.5)

## 2013-09-14 LAB — GLUCOSE, CAPILLARY: Glucose-Capillary: 105 mg/dL — ABNORMAL HIGH (ref 70–99)

## 2013-09-14 LAB — POCT I-STAT, CHEM 8
BUN: 16 mg/dL (ref 6–23)
Calcium, Ion: 1.18 mmol/L (ref 1.13–1.30)
Chloride: 107 mEq/L (ref 96–112)
Glucose, Bld: 109 mg/dL — ABNORMAL HIGH (ref 70–99)
HCT: 34 % — ABNORMAL LOW (ref 36.0–46.0)
Potassium: 3 mEq/L — ABNORMAL LOW (ref 3.5–5.1)

## 2013-09-14 LAB — URINALYSIS, ROUTINE W REFLEX MICROSCOPIC
Bilirubin Urine: NEGATIVE
Glucose, UA: NEGATIVE mg/dL
Ketones, ur: NEGATIVE mg/dL
Nitrite: NEGATIVE
Specific Gravity, Urine: 1.02 (ref 1.005–1.030)
Urobilinogen, UA: 0.2 mg/dL (ref 0.0–1.0)
pH: 6 (ref 5.0–8.0)

## 2013-09-14 MED ORDER — SODIUM CHLORIDE 0.9 % IV SOLN
1000.0000 mL | Freq: Once | INTRAVENOUS | Status: AC
  Administered 2013-09-14: 1000 mL via INTRAVENOUS

## 2013-09-14 MED ORDER — SODIUM CHLORIDE 0.9 % IV SOLN
1000.0000 mL | INTRAVENOUS | Status: DC
  Administered 2013-09-15: 1000 mL via INTRAVENOUS

## 2013-09-14 NOTE — ED Provider Notes (Signed)
CSN: [id number]     Arrival date & time 09/14/13  2204 History   First MD Initiated Contact with Patient 09/14/13 2300     Chief Complaint  Patient presents with  . Altered Mental Status   (Consider location/radiation/quality/duration/timing/severity/associated sxs/prior Treatment) HPI Patient presents with her daughters who relieved the history of present illness. According the patient's daughters, since approximately 15 hours ago the patient has had a decreased interactivity. This began following a minor fall from her toilet to the floor.  Symptoms have been persistent throughout the day, with spiking confusion approximately 6 hours prior to my evaluation.  Is been no report of any fever, vomiting, diarrhea, incontinence. Patient has multiple medical problems, and including prior breast cancer, lobectomy, alcoholism During this illness, there has been no clear alleviating or exacerbating factors, and the patient was in her usual state of health prior to the onset of symptoms.  Past Medical History  Diagnosis Date  . Hearing loss   . COPD (chronic obstructive pulmonary disease)   . GERD (gastroesophageal reflux disease)   . IBS (irritable bowel syndrome)   . Gallstones   . Alcoholism   . Adenocarcinoma, breast   . Primary lung squamous cell carcinoma   . DJD (degenerative joint disease)   . Osteoporosis   . Neuropathy   . Anxiety   . Psoriasis   . Anemia    Past Surgical History  Procedure Laterality Date  . Lumbar laminectomy    . 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 Left lung surgery with lllobectomy for squamous cell cancer  09/1998    Dr. Edwyna Shell  . Left breast lumpectomy with sentinel lymph node biopsy    . Left femur fracture repaired  04/2004    Dr. Lajoyce Corners   No family history on file. History  Substance Use Topics  . Smoking status: Former Smoker -- 40 years    Quit date: 12/08/1997  . Smokeless tobacco: Not on file  . Alcohol Use:  No     Comment: quit in 1987   OB History   Grav Para Term Preterm Abortions TAB SAB Ect Mult Living                 Review of Systems  Constitutional:       Per HPI, otherwise negative  HENT:       Per HPI, otherwise negative  Respiratory:       Per HPI, otherwise negative  Cardiovascular:       Per HPI, otherwise negative  Gastrointestinal: Negative for vomiting.  Endocrine:       Negative aside from HPI  Genitourinary:       Neg aside from HPI   Musculoskeletal:       Per HPI, otherwise negative  Skin: Negative.   Neurological: Negative for syncope.    Allergies  Review of patient's allergies indicates no known allergies.  Home Medications   Current Outpatient Rx  Name  Route  Sig  Dispense  Refill  . acetaminophen (TYLENOL) 325 MG tablet   Oral   Take 650 mg by mouth every 6 (six) hours as needed for pain.         Marland Kitchen albuterol (PROVENTIL) (2.5 MG/3ML) 0.083% nebulizer solution      Use 1 vial in nebulizer up to 3 times daily as needed FILE UNDER MCR PART B   90 vial   3   . calcium carbonate (OS-CAL) 600 MG TABS  Oral   Take 600 mg by mouth 2 (two) times daily with a meal.           . chlordiazePOXIDE (LIBRIUM) 10 MG capsule   Oral   Take 1 capsule (10 mg total) by mouth 3 (three) times daily as needed for anxiety.   90 capsule   3   . cholecalciferol (VITAMIN D) 1000 UNITS tablet   Oral   Take 1,000 Units by mouth daily.          . cyanocobalamin 2000 MCG tablet   Oral   Take 2,000 mcg by mouth daily.           Marland Kitchen dicyclomine (BENTYL) 20 MG tablet      TAKE 1 TABLET BY MOUTH 3 TIMES A DAY AS NEEDED FOR CRAMPING   100 tablet   2   . Fluticasone-Salmeterol (ADVAIR DISKUS) 250-50 MCG/DOSE AEPB      INHALE 1 PUFF TWICE A DAY   60 each   11   . gabapentin (NEURONTIN) 300 MG capsule      TAKE ONE CAPSULE BY MOUTH 3 TIMES A DAY   90 capsule   5   . ibuprofen (ADVIL,MOTRIN) 400 MG tablet   Oral   Take 400 mg by mouth every 6  (six) hours as needed for pain.         Marland Kitchen lansoprazole (PREVACID) 15 MG capsule   Oral   Take 1 capsule (15 mg total) by mouth 2 (two) times daily.   60 capsule   11   . meclizine (ANTIVERT) 25 MG tablet      Take 1/2 to 1 tablet by mouth every 4 hours as needed for dizziness   50 tablet   6   . megestrol (MEGACE ORAL) 40 MG/ML suspension      Take 1 teaspoonful by mouth four times daily   600 mL   6   . mirtazapine (REMERON) 15 MG tablet   Oral   Take 1 tablet (15 mg total) by mouth at bedtime as needed.   30 tablet   5   . Multiple Vitamins-Minerals (CENTRUM SILVER PO)   Oral   Take 1 tablet by mouth daily.           . ondansetron (ZOFRAN) 4 MG tablet   Oral   Take 1 tablet (4 mg total) by mouth every 4 (four) hours as needed for nausea.   30 tablet   5   . sertraline (ZOLOFT) 100 MG tablet   Oral   Take 1 tablet (100 mg total) by mouth daily.   30 tablet   6   . traMADol (ULTRAM) 50 MG tablet   Oral   Take 1 tablet (50 mg total) by mouth 3 (three) times daily as needed for pain.   90 tablet   5   . triamcinolone cream (KENALOG) 0.1 %   Topical   Apply 1 application topically 2 (two) times daily. To affected area   480 g   11   . vitamin E 400 UNIT capsule   Oral   Take 400 Units by mouth daily.            BP 109/63  Pulse 109  Temp(Src) 98 F (36.7 C) (Oral)  Resp 15  SpO2 90% Physical Exam  Nursing note and vitals reviewed. Constitutional: She is oriented to person, place, and time. She appears well-developed and well-nourished. No distress.  Elderly, frail female resting in bed  HENT:  Head: Normocephalic and atraumatic.  Eyes: Conjunctivae and EOM are normal.  Cardiovascular: Regular rhythm.  Tachycardia present.   Murmur heard. Pulmonary/Chest: Effort normal and breath sounds normal. No stridor. No respiratory distress.  Abdominal: She exhibits no distension.  Musculoskeletal: She exhibits no edema.  No gross deformities.  No  tenderness to palpation of the hips, no pelvis instability.  Patient elevates each leg independently, without significant pain.   Neurological: She is alert and oriented to person, place, and time. No cranial nerve deficit.  Skin: Skin is warm and dry.  Multiple areas of ecchymosis, without frank laceration  Psychiatric: She has a normal mood and affect. Her speech is delayed. She is slowed. Cognition and memory are impaired.    ED Course  Procedures (including critical care time) Labs Review Labs Reviewed  GLUCOSE, CAPILLARY - Abnormal; Notable for the following:    Glucose-Capillary 105 (*)    All other components within normal limits  POCT I-STAT, CHEM 8 - Abnormal; Notable for the following:    Potassium 3.0 (*)    Glucose, Bld 109 (*)    Hemoglobin 11.6 (*)    HCT 34.0 (*)    All other components within normal limits  URINALYSIS, ROUTINE W REFLEX MICROSCOPIC  COMPREHENSIVE METABOLIC PANEL  LACTIC ACID, PLASMA  PROTIME-INR  CBC WITH DIFFERENTIAL   Imaging Review No results found. EKG has sinus rhythm, rate 84, T wave changes, artifact, abnormal Cardiac monitor shows rate 80, sinus, normal Pulse oximetry is 100% on nasal cannula, this is abnormal 90% on room air this is abnormal   1:07 AM Patient was sleeping, she is hypotensive, 90/50.  IV fluids running.  With her pneumonia, she will be admitted for further evaluation and management.  MDM  No diagnosis found. This patient presents home with altered mental status.  On exam she is awake, but she is hypoxic, borderline hypotensive.  There is evidence of pneumonia, and given her age, the altered mental status, no hypoxia, she was admitted for further evaluation and management.    Gerhard Munch, MD 09/15/13 [phone]

## 2013-09-14 NOTE — ED Notes (Signed)
Notified RN, Tresa Endo pt. CBG 105.

## 2013-09-14 NOTE — ED Notes (Signed)
Pt daughter at the bedside reports around 5:45 when speaking to the patient on the phone she noticed the patient was confused. She says that she went over to her house and she got her to eat, and around 8:30 she feels the patient started to become back to her baseline. Pt also had a fall in the bathroom this morning and was found to be in a sitting position on the floor.

## 2013-09-14 NOTE — ED Notes (Signed)
EKG old and new given to EDP,Harrison,MD. 

## 2013-09-14 NOTE — ED Notes (Signed)
Patient transported to X-ray, ct

## 2013-09-15 ENCOUNTER — Inpatient Hospital Stay (HOSPITAL_COMMUNITY): Payer: Medicare Other

## 2013-09-15 ENCOUNTER — Encounter (HOSPITAL_COMMUNITY): Payer: Self-pay | Admitting: Emergency Medicine

## 2013-09-15 DIAGNOSIS — J9601 Acute respiratory failure with hypoxia: Secondary | ICD-10-CM | POA: Diagnosis present

## 2013-09-15 DIAGNOSIS — J189 Pneumonia, unspecified organism: Secondary | ICD-10-CM | POA: Diagnosis present

## 2013-09-15 DIAGNOSIS — J9600 Acute respiratory failure, unspecified whether with hypoxia or hypercapnia: Secondary | ICD-10-CM

## 2013-09-15 DIAGNOSIS — C3490 Malignant neoplasm of unspecified part of unspecified bronchus or lung: Secondary | ICD-10-CM

## 2013-09-15 DIAGNOSIS — L408 Other psoriasis: Secondary | ICD-10-CM

## 2013-09-15 DIAGNOSIS — R4182 Altered mental status, unspecified: Secondary | ICD-10-CM | POA: Diagnosis present

## 2013-09-15 DIAGNOSIS — J449 Chronic obstructive pulmonary disease, unspecified: Secondary | ICD-10-CM

## 2013-09-15 DIAGNOSIS — J90 Pleural effusion, not elsewhere classified: Secondary | ICD-10-CM

## 2013-09-15 DIAGNOSIS — C50919 Malignant neoplasm of unspecified site of unspecified female breast: Secondary | ICD-10-CM

## 2013-09-15 DIAGNOSIS — G894 Chronic pain syndrome: Secondary | ICD-10-CM | POA: Diagnosis present

## 2013-09-15 DIAGNOSIS — K589 Irritable bowel syndrome without diarrhea: Secondary | ICD-10-CM

## 2013-09-15 DIAGNOSIS — D649 Anemia, unspecified: Secondary | ICD-10-CM

## 2013-09-15 LAB — COMPREHENSIVE METABOLIC PANEL
ALT: 8 U/L (ref 0–35)
AST: 22 U/L (ref 0–37)
CO2: 23 mEq/L (ref 19–32)
Calcium: 8.5 mg/dL (ref 8.4–10.5)
Chloride: 103 mEq/L (ref 96–112)
GFR calc Af Amer: 69 mL/min — ABNORMAL LOW (ref >90)
GFR calc non Af Amer: 59 mL/min — ABNORMAL LOW (ref >90)
Glucose, Bld: 104 mg/dL — ABNORMAL HIGH (ref 70–99)
Sodium: 135 mEq/L (ref 135–145)
Total Bilirubin: 0.5 mg/dL (ref 0.3–1.2)
Total Protein: 6 g/dL (ref 6.0–8.3)

## 2013-09-15 LAB — CBC
Hemoglobin: 9.4 g/dL — ABNORMAL LOW (ref 12.0–15.0)
MCH: 30.2 pg (ref 26.0–34.0)
MCHC: 33.6 g/dL (ref 30.0–36.0)
Platelets: 238 10*3/uL (ref 150–400)
RBC: 3.11 MIL/uL — ABNORMAL LOW (ref 3.87–5.11)

## 2013-09-15 LAB — STREP PNEUMONIAE URINARY ANTIGEN: Strep Pneumo Urinary Antigen: NEGATIVE

## 2013-09-15 LAB — CREATININE, SERUM
Creatinine, Ser: 0.74 mg/dL (ref 0.50–1.10)
GFR calc Af Amer: >90 mL/min (ref >90)
GFR calc non Af Amer: 78 mL/min — ABNORMAL LOW (ref >90)

## 2013-09-15 LAB — RAPID URINE DRUG SCREEN, HOSP PERFORMED
Barbiturates: NOT DETECTED
Benzodiazepines: POSITIVE — AB
Cocaine: NOT DETECTED
Tetrahydrocannabinol: NOT DETECTED

## 2013-09-15 LAB — LACTIC ACID, PLASMA: Lactic Acid, Venous: 1 mmol/L (ref 0.5–2.2)

## 2013-09-15 MED ORDER — DICYCLOMINE HCL 20 MG PO TABS
20.0000 mg | ORAL_TABLET | Freq: Three times a day (TID) | ORAL | Status: DC
  Administered 2013-09-15 – 2013-09-26 (×31): 20 mg via ORAL
  Filled 2013-09-15 (×36): qty 1

## 2013-09-15 MED ORDER — MEGESTROL ACETATE 400 MG/10ML PO SUSP
800.0000 mg | Freq: Every day | ORAL | Status: DC
  Administered 2013-09-15 – 2013-09-26 (×12): 800 mg via ORAL
  Filled 2013-09-15 (×12): qty 20

## 2013-09-15 MED ORDER — SODIUM CHLORIDE 0.9 % IV SOLN: INTRAVENOUS | Status: DC

## 2013-09-15 MED ORDER — DEXTROSE 5 % IV SOLN
1.0000 g | Freq: Once | INTRAVENOUS | Status: AC
  Administered 2013-09-15: 1 g via INTRAVENOUS
  Filled 2013-09-15: qty 10

## 2013-09-15 MED ORDER — ALBUTEROL SULFATE (5 MG/ML) 0.5% IN NEBU: 2.5000 mg | INHALATION_SOLUTION | RESPIRATORY_TRACT | Status: DC

## 2013-09-15 MED ORDER — PANTOPRAZOLE SODIUM 20 MG PO TBEC
20.0000 mg | DELAYED_RELEASE_TABLET | Freq: Every day | ORAL | Status: DC
  Administered 2013-09-15 – 2013-09-26 (×12): 20 mg via ORAL
  Filled 2013-09-15 (×12): qty 1

## 2013-09-15 MED ORDER — IOHEXOL 300 MG/ML  SOLN
80.0000 mL | Freq: Once | INTRAMUSCULAR | Status: AC | PRN
  Administered 2013-09-15: 80 mL via INTRAVENOUS

## 2013-09-15 MED ORDER — CHLORDIAZEPOXIDE HCL 10 MG PO CAPS
10.0000 mg | ORAL_CAPSULE | Freq: Three times a day (TID) | ORAL | Status: DC | PRN
  Administered 2013-09-16 – 2013-09-26 (×18): 10 mg via ORAL
  Filled 2013-09-15 (×19): qty 1

## 2013-09-15 MED ORDER — ENOXAPARIN SODIUM 30 MG/0.3ML ~~LOC~~ SOLN
30.0000 mg | SUBCUTANEOUS | Status: DC
  Administered 2013-09-15 – 2013-09-26 (×12): 30 mg via SUBCUTANEOUS
  Filled 2013-09-15 (×12): qty 0.3

## 2013-09-15 MED ORDER — AZITHROMYCIN 500 MG IV SOLR
500.0000 mg | INTRAVENOUS | Status: DC
  Administered 2013-09-16: 500 mg via INTRAVENOUS
  Filled 2013-09-15: qty 500

## 2013-09-15 MED ORDER — ALBUTEROL SULFATE (5 MG/ML) 0.5% IN NEBU: 2.5000 mg | INHALATION_SOLUTION | Freq: Four times a day (QID) | RESPIRATORY_TRACT | Status: DC | PRN

## 2013-09-15 MED ORDER — ALBUTEROL SULFATE (5 MG/ML) 0.5% IN NEBU
2.5000 mg | INHALATION_SOLUTION | Freq: Three times a day (TID) | RESPIRATORY_TRACT | Status: DC
  Administered 2013-09-16 – 2013-09-26 (×28): 2.5 mg via RESPIRATORY_TRACT
  Filled 2013-09-15 (×30): qty 0.5

## 2013-09-15 MED ORDER — SERTRALINE HCL 100 MG PO TABS
100.0000 mg | ORAL_TABLET | Freq: Every day | ORAL | Status: DC
  Administered 2013-09-15 – 2013-09-23 (×9): 100 mg via ORAL
  Filled 2013-09-15 (×9): qty 1

## 2013-09-15 MED ORDER — SODIUM CHLORIDE 0.9 % IV SOLN
INTRAVENOUS | Status: DC
  Administered 2013-09-16 – 2013-09-25 (×10): via INTRAVENOUS

## 2013-09-15 MED ORDER — TRAMADOL HCL 50 MG PO TABS
50.0000 mg | ORAL_TABLET | Freq: Three times a day (TID) | ORAL | Status: DC | PRN
  Administered 2013-09-15 – 2013-09-25 (×16): 50 mg via ORAL
  Filled 2013-09-15 (×20): qty 1

## 2013-09-15 MED ORDER — ACETAMINOPHEN 325 MG PO TABS
650.0000 mg | ORAL_TABLET | Freq: Four times a day (QID) | ORAL | Status: DC | PRN
  Administered 2013-09-16 – 2013-09-25 (×12): 650 mg via ORAL
  Filled 2013-09-15 (×12): qty 2

## 2013-09-15 MED ORDER — MIRTAZAPINE 15 MG PO TABS
15.0000 mg | ORAL_TABLET | Freq: Every day | ORAL | Status: DC
  Administered 2013-09-15 – 2013-09-25 (×11): 15 mg via ORAL
  Filled 2013-09-15 (×12): qty 1

## 2013-09-15 MED ORDER — DEXTROSE 5 % IV SOLN
500.0000 mg | Freq: Once | INTRAVENOUS | Status: AC
  Administered 2013-09-15: 500 mg via INTRAVENOUS

## 2013-09-15 MED ORDER — GABAPENTIN 300 MG PO CAPS
300.0000 mg | ORAL_CAPSULE | Freq: Two times a day (BID) | ORAL | Status: DC
  Administered 2013-09-15 – 2013-09-26 (×22): 300 mg via ORAL
  Filled 2013-09-15 (×24): qty 1

## 2013-09-15 MED ORDER — ENOXAPARIN SODIUM 40 MG/0.4ML ~~LOC~~ SOLN: 40.0000 mg | SUBCUTANEOUS | Status: DC

## 2013-09-15 MED ORDER — MOMETASONE FURO-FORMOTEROL FUM 100-5 MCG/ACT IN AERO
2.0000 | INHALATION_SPRAY | Freq: Two times a day (BID) | RESPIRATORY_TRACT | Status: DC
  Administered 2013-09-15 – 2013-09-26 (×20): 2 via RESPIRATORY_TRACT
  Filled 2013-09-15: qty 8.8

## 2013-09-15 MED ORDER — DM-GUAIFENESIN ER 30-600 MG PO TB12
1.0000 | ORAL_TABLET | Freq: Two times a day (BID) | ORAL | Status: DC
  Administered 2013-09-15 – 2013-09-26 (×22): 1 via ORAL
  Filled 2013-09-15 (×24): qty 1

## 2013-09-15 MED ORDER — CHLORDIAZEPOXIDE HCL 10 MG PO CAPS: 10.0000 mg | ORAL_CAPSULE | Freq: Three times a day (TID) | ORAL | Status: DC | PRN

## 2013-09-15 MED ORDER — ALBUTEROL SULFATE (5 MG/ML) 0.5% IN NEBU
2.5000 mg | INHALATION_SOLUTION | RESPIRATORY_TRACT | Status: DC
  Administered 2013-09-15: 2.5 mg via RESPIRATORY_TRACT
  Filled 2013-09-15: qty 0.5

## 2013-09-15 MED ORDER — DEXTROSE 5 % IV SOLN
1.0000 g | INTRAVENOUS | Status: DC
  Administered 2013-09-15: 1 g via INTRAVENOUS
  Filled 2013-09-15 (×2): qty 10

## 2013-09-15 NOTE — Progress Notes (Signed)
[facility] PROGRESS NOTE  VALA RAFFO XBM:[id number] DOB: Mar 23, 1929 DOA: 09/14/2013 PCP: [name], MD  Assessment/Plan: 1. LLL pneumonia; continue antibiotics -Restart patient on albuterol nebulizers,  Dulera, obtain RR/SpO2 vitals every 4 hours -Start Mucinex -start pulmonary toilet  2. LT parapneumonic effusion?; Obtain chest CT with and without contrast rule out recurrence of squamous cell lung cancer vs adenocarcinoma of the breast -Consider pulmonology consult after CT obtained  3. COPD; continue O2 to maintain SPO2> 92% -Start patient's albuterol nebulizer -Start Dulera -RR/SpO2 vitals every 4 hours  4. Anxiety; start chlordiazepoxide (home medication)  5. Irritable bowel syndrome; start Bentyl (home medication)   6.  Depression;  Start Remeron + Zoloft (home regimen)  7. Malnutrition; restart Megace 800 mg daily -Consult nutrition  8. Altered mental status; A./O. x4  9. chronic pain; restart home medication  Code Status: DNR Family Communication:  Disposition Plan:    Consultants:    Procedures:  CXR 09/15/2013 Interval development of patchy airspace disease throughout the left  lower lobe concerning for pneumonia. - Possible small parapneumonic effusion. - left upper lobectomy with volume loss in the left hemi thorax with right-to-left shift of the cardiac and mediastinal contours.  -Pulmonary hyperexpansion, emphysema and chronic bronchitic changes remain stable.   CT head without contrast 09/15/2013 No acute intracranial abnormality.  Stable atrophy and chronic microvascular ischemic white matter  Changes.  CT chest with contrast 09/15/2013; results pending   Antibiotics:  Azithromycin 10/10>>>  Ceftriaxone 10/9>>  HPI/Subjective: 79 yo WF PMHx IBS, Osteoporosis, squamous cell lung cancer (s/p left thoracotomy w/ LLLobectomy for squamous cell ca 10/99 by Dr Edwyna Shell... no known recurrence)., adenocarcinoma of the breast (left  breast lumpectomy and sentinel node biopsy 12/03 by Dr Ezzard Standing for a 2.2cm infiltrating carcinoma, neg LN's, ER/PR pos, treated w/ XRT, then Tamoxifen for 61yrs), COPD, alcoholism, anxiety, anemia, lives at home with son comes in with worsening confusion today, no fevers. Daughter with her now. No n/v/d. No rashes. No cough. No sob. No complaints of pain. She was just more confused than normal. Fairly independent at home. Has not complaints right now in ED. cxr shows pna. No recent abx. TODAY states feels mildly SOB while on 2 L 02 via [place]. States does not use O2 at home. Negative CP     Objective: Filed Vitals:   09/15/13 0033 09/15/13 0348 09/15/13 0516 09/15/13 0545  BP: 94/58 94/52 94/45  117/71  Pulse: 68 62 68 72  Temp: 97.7 F (36.5 C) 98.4 F (36.9 C)  98.4 F (36.9 C)  TempSrc: Oral   Oral  Resp: 15 16 16 18   SpO2: 95% 100% 99% 100%    Intake/Output Summary (Last 24 hours) at 09/15/13 1449 Last data filed at 09/15/13 1259  Gross per 24 hour  Intake      0 ml  Output    700 ml  Net   -700 ml   There were no vitals filed for this visit.  Exam:   General: A./O. x4, mildly confused as to number surgery is performed, and times rocedures  Cardiovascular:, Tachycardic, negative murmurs rubs or gallops, DP/PT pulse 2+ bilateral   Respiratory: Rt Lung CTA, Lt Lung No sounds  Abdomen: Soft, nontender, nondistended plus bowel sounds  Musculoskeletal: Very cachectic female, negative pedal edema   Data Reviewed: Basic Metabolic Panel:  Recent Labs Lab 09/14/13 2258 09/14/13 2350 09/15/13 0645  NA 139 135  --   K 3.0* 3.5  --   CL 107 103  --  CO2  --  23  --   GLUCOSE 109* 104*  --   BUN 16 17  --   CREATININE 1.10 0.89 0.74  CALCIUM  --  8.5  --    Liver Function Tests:  Recent Labs Lab 09/14/13 2350  AST 22  ALT 8  ALKPHOS 58  BILITOT 0.5  PROT 6.0  ALBUMIN 2.7*   No results found for this basename: LIPASE, AMYLASE,  in the last 168 hours No results  found for this basename: AMMONIA,  in the last 168 hours CBC:  Recent Labs Lab 09/14/13 2258 09/14/13 2350 09/15/13 0645  WBC  --  20.6* 17.9*  NEUTROABS  --  16.4*  --   HGB 11.6* 10.7* 9.4*  HCT 34.0* 31.9* 28.0*  MCV  --  88.9 90.0  PLT  --  243 238   Cardiac Enzymes: No results found for this basename: CKTOTAL, CKMB, CKMBINDEX, TROPONINI,  in the last 168 hours BNP (last 3 results) No results found for this basename: PROBNP,  in the last 8760 hours CBG:  Recent Labs Lab 09/14/13 2219  GLUCAP 105*    No results found for this or any previous visit (from the past 240 hour(s)).   Studies: Dg Chest 2 View  09/15/2013   CLINICAL DATA:  Short of breath, chest discomfort  EXAM: CHEST  2 VIEW  COMPARISON:  Prior chest x-ray 04/15/2013  FINDINGS: Interval development of patchy airspace disease throughout the left lower lobe concerning for pneumonia. Possible small parapneumonic effusion. Changes are superimposed on a background of left upper lobectomy with volume loss in the left hemi thorax with right-to-left shift of the cardiac and mediastinal contours. Pulmonary hyperexpansion, emphysema and chronic bronchitic changes remain stable. Atherosclerotic calcifications noted in the transverse aorta. No acute osseous abnormality.  IMPRESSION: 1. Left lower lobe pneumonia. 2. Query small associated parapneumonic effusion. 3. Chronic changes as above.   Electronically Signed   By: Malachy Moan M.D.   On: 09/15/2013 00:11   Ct Head Wo Contrast  09/15/2013   CLINICAL DATA:  Altered mental status  EXAM: CT HEAD WITHOUT CONTRAST  TECHNIQUE: Contiguous axial images were obtained from the base of the skull through the vertex without intravenous contrast.  COMPARISON:  Prior CT scan of the head 06/01/2011  FINDINGS: Negative for acute intracranial hemorrhage, acute infarction, mass, mass effect, hydrocephalus or midline shift. Gray-white differentiation is preserved throughout. Stable global  cerebral volume loss. Unchanged moderate periventricular and deep white matter hypoattenuation most consistent with longstanding microvascular ischemia. Lacunar infarct versus dilated perivascular space in the left basal ganglia is unchanged. No acute soft tissue or calvarial abnormalities.  IMPRESSION: No acute intracranial abnormality.  Stable atrophy and chronic microvascular ischemic white matter changes.   Electronically Signed   By: Malachy Moan M.D.   On: 09/15/2013 00:38    Scheduled Meds: . [START ON 09/16/2013] azithromycin  500 mg Intravenous Q24H  . cefTRIAXone (ROCEPHIN)  IV  1 g Intravenous Q24H  . enoxaparin (LOVENOX) injection  30 mg Subcutaneous Q24H  . gabapentin  300 mg Oral BID  . sertraline  100 mg Oral Daily   Continuous Infusions: . sodium chloride      Principal Problem:   CAP (community acquired pneumonia) Active Problems:   HEARING LOSS   COPD   PSORIASIS   Memory loss   Acute respiratory failure with hypoxia   Altered mental status    Time spent: 90 minutes   Avram Danielson, J  [facility] Pager [phone]. If 7PM-7AM, please contact night-coverage at [email or web address], password [facility] 09/15/2013, 2:49 PM  LOS: 1 day

## 2013-09-15 NOTE — H&P (Signed)
PCP:   [name], MD   Chief Complaint:  confusion  HPI: 79 yo female lives at home with son comes in with worsening confusion today, no fevers.  Dtr with her now.  No n/v/d.  No rashes.  No cough.  No sob.  No complaints of pain.  She was just more confused than normal.  Fairly independent at home.  Has not complaints right now in ED.  cxr shows pna.  No recent abx.  Review of Systems:  Positive and negative as per HPI otherwise all other systems are negative  Past Medical History: Past Medical History  Diagnosis Date  . Hearing loss   . COPD (chronic obstructive pulmonary disease)   . GERD (gastroesophageal reflux disease)   . IBS (irritable bowel syndrome)   . Gallstones   . Alcoholism   . Adenocarcinoma, breast   . Primary lung squamous cell carcinoma   . DJD (degenerative joint disease)   . Osteoporosis   . Neuropathy   . Anxiety   . Psoriasis   . Anemia    Past Surgical History  Procedure Laterality Date  . Lumbar laminectomy    . 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 Left lung surgery with lllobectomy for squamous cell cancer  09/1998    Dr. Edwyna Shell  . Left breast lumpectomy with sentinel lymph node biopsy    . Left femur fracture repaired  04/2004    Dr. Lajoyce Corners    Medications: Prior to Admission medications   Medication Sig Start Date End Date Taking? Authorizing Provider  acetaminophen (TYLENOL) 325 MG tablet Take 650 mg by mouth every 6 (six) hours as needed for pain.   Yes Historical Provider, MD  albuterol (PROVENTIL) (2.5 MG/3ML) 0.083% nebulizer solution Use 1 vial in nebulizer up to 3 times daily as needed FILE UNDER MCR PART B 03/18/11  Yes [name], MD  calcium carbonate (OS-CAL) 600 MG TABS Take 600 mg by mouth 2 (two) times daily with a meal.     Yes Historical Provider, MD  chlordiazePOXIDE (LIBRIUM) 10 MG capsule Take 1 capsule (10 mg total) by mouth 3 (three) times daily as needed for anxiety. 06/02/13 06/02/14  Yes [name], MD  cholecalciferol (VITAMIN D) 1000 UNITS tablet Take 1,000 Units by mouth daily.    Yes Historical Provider, MD  cyanocobalamin 2000 MCG tablet Take 2,000 mcg by mouth daily.     Yes Historical Provider, MD  dicyclomine (BENTYL) 20 MG tablet TAKE 1 TABLET BY MOUTH 3 TIMES A DAY AS NEEDED FOR CRAMPING 06/20/12  Yes [name], MD  Fluticasone-Salmeterol (ADVAIR DISKUS) 250-50 MCG/DOSE AEPB INHALE 1 PUFF TWICE A DAY 04/21/13  Yes [name], MD  gabapentin (NEURONTIN) 300 MG capsule TAKE ONE CAPSULE BY MOUTH 3 TIMES A DAY 08/05/13  Yes [name], MD  ibuprofen (ADVIL,MOTRIN) 400 MG tablet Take 400 mg by mouth every 6 (six) hours as needed for pain.   Yes Historical Provider, MD  lansoprazole (PREVACID) 15 MG capsule Take 1 capsule (15 mg total) by mouth 2 (two) times daily. 10/15/12  Yes [name], MD  meclizine (ANTIVERT) 25 MG tablet Take 1/2 to 1 tablet by mouth every 4 hours as needed for dizziness 07/23/12  Yes [name], MD  megestrol (MEGACE ORAL) 40 MG/ML suspension Take 1 teaspoonful by mouth four times daily 07/23/12  Yes [name], MD  mirtazapine (REMERON) 15 MG tablet Take 1  tablet (15 mg total) by mouth at bedtime as needed. 07/29/13  Yes [name], MD  Multiple Vitamins-Minerals (CENTRUM SILVER PO) Take 1 tablet by mouth daily.     Yes Historical Provider, MD  ondansetron (ZOFRAN) 4 MG tablet Take 1 tablet (4 mg total) by mouth every 4 (four) hours as needed for nausea. 04/15/13 04/15/14 Yes [name], MD  sertraline (ZOLOFT) 100 MG tablet Take 1 tablet (100 mg total) by mouth daily. 02/16/13  Yes [name], MD  traMADol (ULTRAM) 50 MG tablet Take 1 tablet (50 mg total) by mouth 3 (three) times daily as needed for pain. 08/19/13  Yes [name], MD  triamcinolone cream (KENALOG) 0.1 % Apply 1 application topically 2 (two) times daily. To affected area 04/29/13  Yes [name], MD  vitamin E 400 UNIT capsule Take 400 Units by mouth daily.      Yes Historical Provider, MD    Allergies:  No Known Allergies  Social History:  reports that she quit smoking about 15 years ago. She does not have any smokeless tobacco history on file. She reports that she does not drink alcohol. Her drug history is not on file.  Family History: none  Physical Exam: Filed Vitals:   09/14/13 2206 09/15/13 0033  BP: 109/63 94/58  Pulse: 109 68  Temp: 98 F (36.7 C) 97.7 F (36.5 C)  TempSrc: Oral Oral  Resp: 15 15  SpO2: 90% 95%   General appearance: alert, cooperative and no distress Head: Normocephalic, without obvious abnormality, atraumatic Eyes: negative Nose: Nares normal. Septum midline. Mucosa normal. No drainage or sinus tenderness. Neck: no JVD and supple, symmetrical, trachea midline Lungs: diminished breath sounds LLL Heart: regular rate and rhythm, S1, S2 normal, no murmur, click, rub or gallop Abdomen: soft, non-tender; bowel sounds normal; no masses,  no organomegaly Extremities: extremities normal, atraumatic, no cyanosis or edema Pulses: 2+ and symmetric Skin: Skin color, texture, turgor normal. No rashes or lesions Neurologic: Grossly normal    Labs on Admission:   Recent Labs  09/14/13 2258 09/14/13 2350  NA 139 135  K 3.0* 3.5  CL 107 103  CO2  --  23  GLUCOSE 109* 104*  BUN 16 17  CREATININE 1.10 0.89  CALCIUM  --  8.5    Recent Labs  09/14/13 2350  AST 22  ALT 8  ALKPHOS 58  BILITOT 0.5  PROT 6.0  ALBUMIN 2.7*    Recent Labs  09/14/13 2258 09/14/13 2350  WBC  --  20.6*  NEUTROABS  --  16.4*  HGB 11.6* 10.7*  HCT 34.0* 31.9*  MCV  --  88.9  PLT  --  243   Radiological Exams on Admission: Dg Chest 2 View  09/15/2013   CLINICAL DATA:  Short of breath, chest discomfort  EXAM: CHEST  2 VIEW  COMPARISON:  Prior chest x-ray 04/15/2013  FINDINGS: Interval development of patchy airspace disease throughout the left lower lobe concerning for pneumonia. Possible small parapneumonic effusion.  Changes are superimposed on a background of left upper lobectomy with volume loss in the left hemi thorax with right-to-left shift of the cardiac and mediastinal contours. Pulmonary hyperexpansion, emphysema and chronic bronchitic changes remain stable. Atherosclerotic calcifications noted in the transverse aorta. No acute osseous abnormality.  IMPRESSION: 1. Left lower lobe pneumonia. 2. Query small associated parapneumonic effusion. 3. Chronic changes as above.   Electronically Signed   By: Malachy Moan M.D.   On: 09/15/2013  00:11   Ct Head Wo Contrast  09/15/2013   CLINICAL DATA:  Altered mental status  EXAM: CT HEAD WITHOUT CONTRAST  TECHNIQUE: Contiguous axial images were obtained from the base of the skull through the vertex without intravenous contrast.  COMPARISON:  Prior CT scan of the head 06/01/2011  FINDINGS: Negative for acute intracranial hemorrhage, acute infarction, mass, mass effect, hydrocephalus or midline shift. Gray-white differentiation is preserved throughout. Stable global cerebral volume loss. Unchanged moderate periventricular and deep white matter hypoattenuation most consistent with longstanding microvascular ischemia. Lacunar infarct versus dilated perivascular space in the left basal ganglia is unchanged. No acute soft tissue or calvarial abnormalities.  IMPRESSION: No acute intracranial abnormality.  Stable atrophy and chronic microvascular ischemic white matter changes.   Electronically Signed   By: Malachy Moan M.D.   On: 09/15/2013 00:38    Assessment/Plan  79 yo female with CAP, confusion Principal Problem:   CAP (community acquired pneumonia) Active Problems:   HEARING LOSS   COPD   PSORIASIS   Memory loss   Acute respiratory failure with hypoxia   Altered mental status  Mild hypoxia.  Improved with 2 L [place].  pna pathway, on rocephin and azithro.  Admit to tele floor.  DNR.  No cpr no intubation.  She should improve with abx.    Leslie [name]  A 09/15/2013, 1:23 AM

## 2013-09-15 NOTE — ED Notes (Signed)
EDP in with patient and family to discuss disposition

## 2013-09-15 NOTE — Progress Notes (Signed)
Patient arousable but sleepy. Waves hand at me but will not stay awake to answer my questions.

## 2013-09-15 NOTE — ED Notes (Signed)
Patient daughter can be reached at [phone]- Bridget Hartshorn

## 2013-09-16 DIAGNOSIS — E43 Unspecified severe protein-calorie malnutrition: Secondary | ICD-10-CM | POA: Diagnosis present

## 2013-09-16 LAB — COMPREHENSIVE METABOLIC PANEL
ALT: 16 U/L (ref 0–35)
AST: 16 U/L (ref 0–37)
Albumin: 2.1 g/dL — ABNORMAL LOW (ref 3.5–5.2)
Alkaline Phosphatase: 61 U/L (ref 39–117)
BUN: 7 mg/dL (ref 6–23)
CO2: 20 mEq/L (ref 19–32)
Chloride: 108 mEq/L (ref 96–112)
Creatinine, Ser: 0.67 mg/dL (ref 0.50–1.10)
GFR calc Af Amer: >90 mL/min (ref >90)
GFR calc non Af Amer: 80 mL/min — ABNORMAL LOW (ref >90)
Glucose, Bld: 150 mg/dL — ABNORMAL HIGH (ref 70–99)
Potassium: 3.2 mEq/L — ABNORMAL LOW (ref 3.5–5.1)
Sodium: 139 mEq/L (ref 135–145)
Total Bilirubin: 0.2 mg/dL — ABNORMAL LOW (ref 0.3–1.2)

## 2013-09-16 LAB — CBC WITH DIFFERENTIAL/PLATELET
Basophils Relative: 0 % (ref 0–1)
Eosinophils Absolute: 0.1 10*3/uL (ref 0.0–0.7)
Eosinophils Relative: 1 % (ref 0–5)
Hemoglobin: 9.8 g/dL — ABNORMAL LOW (ref 12.0–15.0)
Lymphs Abs: 2 10*3/uL (ref 0.7–4.0)
MCH: 30 pg (ref 26.0–34.0)
MCHC: 33.3 g/dL (ref 30.0–36.0)
MCV: 89.9 fL (ref 78.0–100.0)
Monocytes Relative: 9 % (ref 3–12)
Neutrophils Relative %: 76 % (ref 43–77)
Platelets: 287 10*3/uL (ref 150–400)
RBC: 3.27 MIL/uL — ABNORMAL LOW (ref 3.87–5.11)
RDW: 16.4 % — ABNORMAL HIGH (ref 11.5–15.5)

## 2013-09-16 LAB — LEGIONELLA ANTIGEN, URINE

## 2013-09-16 LAB — PHOSPHORUS: Phosphorus: 2.3 mg/dL (ref 2.3–4.6)

## 2013-09-16 LAB — MRSA PCR SCREENING: MRSA by PCR: NEGATIVE

## 2013-09-16 LAB — PRO B NATRIURETIC PEPTIDE: Pro B Natriuretic peptide (BNP): 3115 pg/mL — ABNORMAL HIGH (ref 0–450)

## 2013-09-16 LAB — MAGNESIUM: Magnesium: 1.7 mg/dL (ref 1.5–2.5)

## 2013-09-16 MED ORDER — BOOST PLUS PO LIQD
237.0000 mL | Freq: Two times a day (BID) | ORAL | Status: DC
  Administered 2013-09-16 – 2013-09-21 (×10): 237 mL via ORAL
  Filled 2013-09-16 (×13): qty 237

## 2013-09-16 MED ORDER — VANCOMYCIN HCL 500 MG IV SOLR
500.0000 mg | INTRAVENOUS | Status: DC
  Administered 2013-09-16 – 2013-09-19 (×4): 500 mg via INTRAVENOUS
  Filled 2013-09-16 (×4): qty 500

## 2013-09-16 MED ORDER — PIPERACILLIN-TAZOBACTAM 3.375 G IVPB
3.3750 g | Freq: Three times a day (TID) | INTRAVENOUS | Status: DC
  Administered 2013-09-16 – 2013-09-23 (×21): 3.375 g via INTRAVENOUS
  Filled 2013-09-16 (×22): qty 50

## 2013-09-16 NOTE — Clinical Social Work Psychosocial (Signed)
     Clinical Social Work Department BRIEF PSYCHOSOCIAL ASSESSMENT 09/16/2013  Patient:  Leslie [name],Leslie [name]     Account Number:  [id number]     Admit date:  09/14/2013  Clinical Social Worker:  Hattie Perch  Date/Time:  09/16/2013 12:00 M  Referred by:  Physician  Date Referred:  09/16/2013 Referred for  SNF Placement   Other Referral:   Interview type:  Family Other interview type:    PSYCHOSOCIAL DATA Living Status:  ALONE Admitted from facility:   Level of care:   Primary support name:  Leslie [name] Primary support relationship to patient:  CHILD, ADULT Degree of support available:   good    CURRENT CONCERNS Current Concerns  Post-Acute Placement   Other Concerns:    SOCIAL WORK ASSESSMENT / PLAN CSW met with patient. patient is confused. CSW met with patient's daughter, Leslie [name]. Discussed concerns about discharge planning and that she has hcpoa but not financial. CSW explained we dont do financial here and patient is currently not competent to sign anything. discussed applying for medicaid, however, patient owns her own home. patient has previously been to [facility] but currently owes them 2,000 dollars. she is agreeable to patient being faxed out and recieving bed offers. paula is very frustrated at situation and that prior to this, patient was oriented and able to live on her own.   Assessment/plan status:   Other assessment/ plan:   Information/referral to community resources:    PATIENTS/FAMILYS RESPONSE TO PLAN OF CARE: daughter is frustrated that patient may not be able to return to [facility] because of money owed. she will apply for medicaid and is agreeable to recieving bed offers.

## 2013-09-16 NOTE — Progress Notes (Signed)
ANTIBIOTIC CONSULT NOTE - INITIAL  Pharmacy Consult for vancomycin/Zosyn Indication: possible HAP  No Known Allergies  Patient Measurements: Height: 4' 11.84" (152 cm) Weight: 72 lb 8.5 oz (32.9 kg) IBW/kg (Calculated) : 45.14  Vital Signs: Temp: 98.2 F (36.8 C) (10/10 1311) Temp src: Oral (10/10 1311) BP: 115/60 mmHg (10/10 1311) Pulse Rate: 83 (10/10 1311) Intake/Output from previous day: 10/09 0701 - 10/10 0700 In: 0  Out: 1000 [Urine:1000] Intake/Output from this shift: Total I/O In: -  Out: 2000 [Urine:2000]  Labs:  Recent Labs  09/14/13 2350 09/15/13 0645 09/16/13 0930  WBC 20.6* 17.9* 14.8*  HGB 10.7* 9.4* 9.8*  PLT 243 238 287  CREATININE 0.89 0.74 0.67   Estimated Creatinine Clearance: 28.6 ml/min (by C-G formula based on Cr of 0.67). No results found for this basename: VANCOTROUGH, VANCOPEAK, VANCORANDOM, GENTTROUGH, GENTPEAK, GENTRANDOM, TOBRATROUGH, TOBRAPEAK, TOBRARND, AMIKACINPEAK, AMIKACINTROU, AMIKACIN,  in the last 72 hours   Microbiology: Recent Results (from the past 720 hour(s))  CULTURE, BLOOD (ROUTINE X 2)     Status: None   Collection Time    09/15/13  6:45 AM      Result Value Range Status   Specimen Description BLOOD LEFT ARM   Final   Special Requests BOTTLES DRAWN AEROBIC ONLY 1CC   Final   Culture  Setup Time     Final   Value: 09/15/2013 10:29     Performed at [facility]   Culture     Final   Value:        BLOOD CULTURE RECEIVED NO GROWTH TO DATE CULTURE WILL BE HELD FOR 5 DAYS BEFORE ISSUING A FINAL NEGATIVE REPORT     Performed at [facility]   Report Status PENDING   Incomplete  CULTURE, BLOOD (ROUTINE X 2)     Status: None   Collection Time    09/15/13  6:55 AM      Result Value Range Status   Specimen Description BLOOD LEFT HAND   Final   Special Requests     Final   Value: BOTTLES DRAWN AEROBIC AND ANAEROBIC 3CC AER 2CC ANA   Culture  Setup Time     Final   Value: 09/15/2013 10:29     Performed at  [facility]   Culture     Final   Value:        BLOOD CULTURE RECEIVED NO GROWTH TO DATE CULTURE WILL BE HELD FOR 5 DAYS BEFORE ISSUING A FINAL NEGATIVE REPORT     Performed at [facility]   Report Status PENDING   Incomplete    Medical History: Past Medical History  Diagnosis Date  . Hearing loss   . COPD (chronic obstructive pulmonary disease)   . GERD (gastroesophageal reflux disease)   . IBS (irritable bowel syndrome)   . Gallstones   . Alcoholism   . Adenocarcinoma, breast   . Primary lung squamous cell carcinoma   . DJD (degenerative joint disease)   . Osteoporosis   . Neuropathy   . Anxiety   . Psoriasis   . Anemia     Medications:  Scheduled:  . albuterol  2.5 mg Nebulization TID  . dextromethorphan-guaiFENesin  1 tablet Oral BID  . dicyclomine  20 mg Oral TID AC  . enoxaparin (LOVENOX) injection  30 mg Subcutaneous Q24H  . gabapentin  300 mg Oral BID  . lactose free nutrition  237 mL Oral BID BM  . megestrol  800 mg Oral  Daily  . mirtazapine  15 mg Oral QHS  . mometasone-formoterol  2 puff Inhalation BID  . pantoprazole  20 mg Oral Daily  . sertraline  100 mg Oral Daily   Infusions:  . sodium chloride 125 mL/hr at 09/16/13 1519   Assessment: 79 yo female with MHx Breast cancer, COPD, squamous cell lung cancer, etoh abuse presented to [facility] 10/8 with altered mental status. Had a fall from toilet to floor earlier that day. Found to have LLL PNA and ?bilateral effusions and was admitted. Now to change abx's from Rocephin/Zithromax to Vanc/Zosyn for possible HAP   Estimated CrCl only 29 ml/min due to wt of only 32.7 kg  afebrile   Goal of Therapy:  Vancomycin trough level 15-20 mcg/ml  Plan:  1) Vancomycin 500mg  IV q24 2) Zosyn 3.375g IV q8 (extended interval infusion) for CrCl > 20 ml/min   Hessie Knows, PharmD, BCPS Pager [phone] 09/16/2013 5:39 PM

## 2013-09-16 NOTE — Progress Notes (Signed)
[facility] PROGRESS NOTE  Leslie [name] ZOX:[id number] DOB: 1945/04/10 DOA: 09/14/2013 PCP: Leslie Mcalpine, MD  Assessment/Plan: 1. LLL pneumonia HCAP; although patient does not quite meet the criteria for HCAP we will broaden the antibiotic coverage to meet this criteria. Discussed with pulmonology . -Will DC azithromycin and ceftriaxone -Start Zosyn+ vancomycin -Continue patient on albuterol nebulizers,  Dulera, obtain RR/SpO2 vitals every 4 hours -Start Mucinex -start pulmonary toilet -Increase normal saline to 1102ml/hr  2. LT parapneumonic effusion; Obtain chest CT with and without contrast rule out recurrence of squamous cell lung cancer vs adenocarcinoma of the breast; see results below -Consulted Plmonology (Leslie [name]) who agrees patient needs a bronchoscopy however feels patient is too weak currently and will defer until patient's stronger  3. COPD; continue O2 to maintain SPO2> 92% -Start patient's albuterol nebulizer -Start Dulera -RR/SpO2 vitals every 4 hours  4. Anxiety; start chlordiazepoxide (home medication)  5. Irritable bowel syndrome; start Bentyl (home medication)   6.  Depression;  Start Remeron + Zoloft (home regimen)  7. Malnutrition; restart Megace 800 mg daily -Consult nutrition  8. Altered mental status; patient confused today however pleasant  9. chronic pain; restart home medication  10. Pulmonary hypertension; patient has not had a echocardiogram will obtain to better quantify her cardiac status  Code Status: DNR Family Communication: Discussed plan of care with both daughters and granddaughter. Leslie [name] has medical power of attorney [phone] Disposition Plan:    Consultants:  Pulmonology (Leslie [name])   Procedures:  CXR 09/15/2013 Interval development of patchy airspace disease throughout the left  lower lobe concerning for pneumonia. - Possible small parapneumonic effusion. - left upper lobectomy with  volume loss in the left hemi thorax with right-to-left shift of the cardiac and mediastinal contours.  -Pulmonary hyperexpansion, emphysema and chronic bronchitic changes remain stable.   CT head without contrast 09/15/2013 No acute intracranial abnormality.  Stable atrophy and chronic microvascular ischemic white matter  Changes.  CT chest with contrast 09/15/2013;  1. Consolidative opacity within the remaining left lung.  Additionally there is endobronchial material and occlusion of the  left mainstem bronchus. Considerations for the findings within the  left lung include pneumonia, postobstructive pneumonitis, aspiration  or recurrent malignancy. Considering the endobronchial material  within the left mainstem bronchus, correlation with bronchoscopy  should be considered to exclude endobronchial mass.  2. Small bilateral pleural effusions.  3. Dilation of the ascending thoracic aorta.  4. Enlargement of the main pulmonary artery as can be seen with  pulmonary arterial hypertension.   Echocardiogram; pending   Antibiotics:  Azithromycin 10/10>>>D/Ced 10/10   Ceftriaxone 10/9>>D/Ced 10/10   Zosyn 10/10 >>  Vancomycin 10/10 >>    HPI/Subjective: 79 yo WF PMHx IBS, Osteoporosis, squamous cell lung cancer (s/p left thoracotomy w/ LLLobectomy for squamous cell ca 10/99 by Leslie [name]... no known recurrence)., adenocarcinoma of the breast (left breast lumpectomy and sentinel node biopsy 12/03 by Leslie [name] for a 2.2cm infiltrating carcinoma, neg LN's, ER/PR pos, treated w/ XRT, then Tamoxifen for 45yrs), COPD, alcoholism, anxiety, anemia, lives at home with son comes in with worsening confusion today, no fevers. Daughter with her now. No n/v/d. No rashes. No cough. No sob. No complaints of pain. She was just more confused than normal. Fairly independent at home. Has not complaints right now in ED. cxr shows pna. No recent abx. 09/15/2013 states feels mildly SOB while on 2 L 02 via [place].  States does not use O2 at home. Negative  CP. TODAY patient confused today however pleasant. Follows commands but answers questions inappropriately.     Objective: Filed Vitals:   09/16/13 0039 09/16/13 0042 09/16/13 0555 09/16/13 0556  BP:   107/58   Pulse:   82   Temp:   97.9 F (36.6 C)   TempSrc:   Oral   Resp: 22 18 18 18   SpO2: 84% 94% 86% 91%    Intake/Output Summary (Last 24 hours) at 09/16/13 0826 Last data filed at 09/15/13 2227  Gross per 24 hour  Intake      0 ml  Output    700 ml  Net   -700 ml   There were no vitals filed for this visit.  Exam:   General: A./O. x1, increased confusion, follows commands   Cardiovascular:, Tachycardic, negative murmurs rubs or gallops, DP/PT pulse 2+ bilateral   Respiratory: Rt Lung CTA (coarse breath sounds), Lt Lung rhonchi in the apex, poor air movement in the remainder of the left lung   Abdomen: Soft, nontender, nondistended plus bowel sounds  Musculoskeletal: Very cachectic female, negative pedal edema   Data Reviewed: Basic Metabolic Panel:  Recent Labs Lab 09/14/13 2258 09/14/13 2350 09/15/13 0645  NA 139 135  --   K 3.0* 3.5  --   CL 107 103  --   CO2  --  23  --   GLUCOSE 109* 104*  --   BUN 16 17  --   CREATININE 1.10 0.89 0.74  CALCIUM  --  8.5  --    Liver Function Tests:  Recent Labs Lab 09/14/13 2350  AST 22  ALT 8  ALKPHOS 58  BILITOT 0.5  PROT 6.0  ALBUMIN 2.7*   No results found for this basename: LIPASE, AMYLASE,  in the last 168 hours No results found for this basename: AMMONIA,  in the last 168 hours CBC:  Recent Labs Lab 09/14/13 2258 09/14/13 2350 09/15/13 0645  WBC  --  20.6* 17.9*  NEUTROABS  --  16.4*  --   HGB 11.6* 10.7* 9.4*  HCT 34.0* 31.9* 28.0*  MCV  --  88.9 90.0  PLT  --  243 238   Cardiac Enzymes: No results found for this basename: CKTOTAL, CKMB, CKMBINDEX, TROPONINI,  in the last 168 hours BNP (last 3 results) No results found for this basename:  PROBNP,  in the last 8760 hours CBG:  Recent Labs Lab 09/14/13 2219  GLUCAP 105*    No results found for this or any previous visit (from the past 240 hour(s)).   Studies: Dg Chest 2 View  09/15/2013   CLINICAL DATA:  Short of breath, chest discomfort  EXAM: CHEST  2 VIEW  COMPARISON:  Prior chest x-ray 04/15/2013  FINDINGS: Interval development of patchy airspace disease throughout the left lower lobe concerning for pneumonia. Possible small parapneumonic effusion. Changes are superimposed on a background of left upper lobectomy with volume loss in the left hemi thorax with right-to-left shift of the cardiac and mediastinal contours. Pulmonary hyperexpansion, emphysema and chronic bronchitic changes remain stable. Atherosclerotic calcifications noted in the transverse aorta. No acute osseous abnormality.  IMPRESSION: 1. Left lower lobe pneumonia. 2. Query small associated parapneumonic effusion. 3. Chronic changes as above.   Electronically Signed   By: Malachy Moan M.D.   On: 09/15/2013 00:11   Ct Head Wo Contrast  09/15/2013   CLINICAL DATA:  Altered mental status  EXAM: CT HEAD WITHOUT CONTRAST  TECHNIQUE: Contiguous axial images were obtained  from the base of the skull through the vertex without intravenous contrast.  COMPARISON:  Prior CT scan of the head 06/01/2011  FINDINGS: Negative for acute intracranial hemorrhage, acute infarction, mass, mass effect, hydrocephalus or midline shift. Gray-white differentiation is preserved throughout. Stable global cerebral volume loss. Unchanged moderate periventricular and deep white matter hypoattenuation most consistent with longstanding microvascular ischemia. Lacunar infarct versus dilated perivascular space in the left basal ganglia is unchanged. No acute soft tissue or calvarial abnormalities.  IMPRESSION: No acute intracranial abnormality.  Stable atrophy and chronic microvascular ischemic white matter changes.   Electronically Signed   By:  Malachy Moan M.D.   On: 09/15/2013 00:38   Ct Chest W Contrast  09/16/2013   CLINICAL DATA:  Patient with history of breast cancer and squamous cell lung cancer.  EXAM: CT CHEST WITH CONTRAST  TECHNIQUE: Multidetector CT imaging of the chest was performed during intravenous contrast administration.  CONTRAST:  80mL OMNIPAQUE IOHEXOL 300 MG/ML  SOLN  COMPARISON:  Chest radiograph 09/14/2013; chest CT 11/25/2005.  FINDINGS: The visualized thyroid is unremarkable. No enlarged axillary, mediastinal or hilar lymphadenopathy. The ascending thoracic aorta is dilated 3.5 cm. The main pulmonary artery is enlarged measuring 3.2 cm. Coronary artery calcifications.  Leftward shift of the mediastinum. Diffuse emphysematous change. There is endobronchial material demonstrated within the left mainstem bronchus at the level of the left atrium. Postsurgical changes involving the left hemi thorax. There are extensive consolidative opacities within the remaining left lung. Additionally there are multiple cystic spaces demonstrated within the consolidative opacity in the left lower lung which may represent a combination of underlying emphysematous change and/or necrosis. There is minimal dependent atelectasis within the right lobe as well as associated scattered ground-glass opacity. Small bilateral pleural effusions.  Limited visualization of the upper abdomen demonstrates no focal abnormality. No aggressive or acute appearing osseous lesions. Postsurgical change involving the left posterior 6th rib.  IMPRESSION: 1. Consolidative opacity within the remaining left lung. Additionally there is endobronchial material and occlusion of the left mainstem bronchus. Considerations for the findings within the left lung include pneumonia, postobstructive pneumonitis, aspiration or recurrent malignancy. Considering the endobronchial material within the left mainstem bronchus, correlation with bronchoscopy should be considered to exclude  endobronchial mass. 2. Small bilateral pleural effusions. 3. Dilation of the ascending thoracic aorta. 4. Enlargement of the main pulmonary artery as can be seen with pulmonary arterial hypertension. These results will be called to the ordering clinician or representative by the Radiologist Assistant, and communication documented in the PACS Dashboard.   Electronically Signed   By: Annia Belt M.D.   On: 09/16/2013 07:39    Scheduled Meds: . albuterol  2.5 mg Nebulization TID  . azithromycin  500 mg Intravenous Q24H  . cefTRIAXone (ROCEPHIN)  IV  1 g Intravenous Q24H  . dextromethorphan-guaiFENesin  1 tablet Oral BID  . dicyclomine  20 mg Oral TID AC  . enoxaparin (LOVENOX) injection  30 mg Subcutaneous Q24H  . gabapentin  300 mg Oral BID  . megestrol  800 mg Oral Daily  . mirtazapine  15 mg Oral QHS  . mometasone-formoterol  2 puff Inhalation BID  . pantoprazole  20 mg Oral Daily  . sertraline  100 mg Oral Daily   Continuous Infusions: . sodium chloride 125 mL/hr at 09/16/13 0324    Principal Problem:   CAP (community acquired pneumonia) Active Problems:   CARCINOMA, LUNG, SQUAMOUS CELL   ADENOCARCINOMA, BREAST   ANXIETY   ALCOHOLISM  HEARING LOSS   COPD   PSORIASIS   Memory loss   Acute respiratory failure with hypoxia   Altered mental status   Chronic pain syndrome   Parapneumonic effusion    Time spent: 90 minutes   WOODS, CURTIS, J  [facility] Pager [phone]. If 7PM-7AM, please contact night-coverage at [email or web address], password [facility] 09/16/2013, 8:26 AM  LOS: 2 days

## 2013-09-16 NOTE — Progress Notes (Signed)
Two daughters at bedside during routine nebulizer therapy. Family does not wish to have their mother receiving frequent nebulizer treatments due to the patient history of side effects, which was stated to be "jittery and confusion". Daughter says that the patient uses a nebulizer at home only once daily and she would prefer her mother not receive more than BID treatments unless absolutely necessary. This daughter tells me she is a Charity fundraiser at [employer] in the pediatric intensive care unit, and she does not think her mother needs more than two treatments daily. The patient tolerated her nebulizer treatment very well, and was pleasantly confused; however, there was no increased confusion post therapy. Rhonchi heard initially upon auscultation in the upper lobes, but clears with cough and had only faint end expiratory wheezing post bronchodilator administration. RT treatment protocol assessment completed. Orders changed according to patient score and history.

## 2013-09-16 NOTE — Progress Notes (Signed)
PT daughter refused 1600 Flutter usage- PT is finally sleeping.

## 2013-09-16 NOTE — Consult Note (Signed)
PULMONARY  / CRITICAL CARE MEDICINE  Name: Leslie [name] MRN: [id number] DOB: 01/05/1929   PCP NADEL,SCOTT Judie Petit, MD   ADMISSION DATE:  09/14/2013 CONSULTATION DATE:  09/16/13  REFERRING MD :  Carolyne Littles, MD PRIMARY SERVICE: PCCM  CHIEF COMPLAINT:  AMS  BRIEF PATIENT DESCRIPTION:  79 yo female with MHx Breast cancer, COPD, squamous cell lung cancer, etoh abuse presented to [facility] 10/8 with altered mental status. Had a fall from toilet to floor earlier that day. Found to have LLL PNA and ?bilateral effusions and was admitted. PCCM asked to consult for possible recurrence or metastasis of lung cancer or breast cancer, respectively.   SIGNIFICANT EVENTS / STUDIES:  10/8 chest xray >>> Left lower lobe pneumonia, ? small associated parapneumonic effusion, s/p LLL lobectomy 10/8 CT head >>> no acute findings 10/9 CT chest w/contrast >>> consolidation in LL, endobronch material and occlusion of main bronchus, small bilateral pleural effusions, dilated ascending aorta, enlarged main pulmonary artery  LINES / TUBES: PIV x 1   CULTURES: 10/8 UA >>>negative 10/9 Blood Cx >>> 10/9 Urine strep/legionella antibody >>> negative  ANTIBIOTICS: Ceftriaxone 10/8 >>> Azithromycin 10/8 >>>  HISTORY OF PRESENT ILLNESS:  Hx taken from previous notes due to no family members in room and pt's current status.   79 yo female with MHx Breast cancer, COPD, squamous cell lung cancer, etoh abuse, osteoporosis, and GERD presented to [facility] w/daughters for increasing confusion 09/14/13. Pt lives at home with her son, had an un witnessed fall from toilet to floor earlier in the day, found in a sitting position. Pt and family members denied prior n/v/d, cough, SOB, rashes, or pain. CT of the head showed no acute findings. Chest Xray revealed LLL pneumonia. She was started on IV Azithro and Ceftriaxone, 10/8.   Daughters requested Albuterol no more than BID, due to pt becoming very 'jittery and confused". Pt now on 2L  O2 nasal canula, 100% O2 sat.  PAST MEDICAL HISTORY :  Past Medical History  Diagnosis Date  . Hearing loss   . COPD (chronic obstructive pulmonary disease)   . GERD (gastroesophageal reflux disease)   . IBS (irritable bowel syndrome)   . Gallstones   . Alcoholism   . Adenocarcinoma, breast   . Primary lung squamous cell carcinoma   . DJD (degenerative joint disease)   . Osteoporosis   . Neuropathy   . Anxiety   . Psoriasis   . Anemia    Past Surgical History  Procedure Laterality Date  . Lumbar laminectomy    . 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 Left lung surgery with lllobectomy for squamous cell cancer  09/1998    Dr. Edwyna Shell  . Left breast lumpectomy with sentinel lymph node biopsy    . Left femur fracture repaired  04/2004    Dr. Lajoyce Corners   Prior to Admission medications   Medication Sig Start Date End Date Taking? Authorizing Provider  acetaminophen (TYLENOL) 325 MG tablet Take 650 mg by mouth every 6 (six) hours as needed for pain.   Yes Historical Provider, MD  albuterol (PROVENTIL) (2.5 MG/3ML) 0.083% nebulizer solution Use 1 vial in nebulizer up to 3 times daily as needed FILE UNDER MCR PART B 03/18/11  Yes [name], MD  calcium carbonate (OS-CAL) 600 MG TABS Take 600 mg by mouth 2 (two) times daily with a meal.     Yes Historical Provider, MD  chlordiazePOXIDE (LIBRIUM) 10 MG capsule Take 1  capsule (10 mg total) by mouth 3 (three) times daily as needed for anxiety. 06/02/13 06/02/14 Yes [name], MD  cholecalciferol (VITAMIN D) 1000 UNITS tablet Take 1,000 Units by mouth daily.    Yes Historical Provider, MD  cyanocobalamin 2000 MCG tablet Take 2,000 mcg by mouth daily.     Yes Historical Provider, MD  dicyclomine (BENTYL) 20 MG tablet TAKE 1 TABLET BY MOUTH 3 TIMES A DAY AS NEEDED FOR CRAMPING 06/20/12  Yes [name], MD  Fluticasone-Salmeterol (ADVAIR DISKUS) 250-50 MCG/DOSE AEPB INHALE 1 PUFF TWICE A DAY 04/21/13  Yes [name], MD  gabapentin (NEURONTIN) 300 MG capsule TAKE ONE CAPSULE BY MOUTH 3 TIMES A DAY 08/05/13  Yes [name], MD  ibuprofen (ADVIL,MOTRIN) 400 MG tablet Take 400 mg by mouth every 6 (six) hours as needed for pain.   Yes Historical Provider, MD  lansoprazole (PREVACID) 15 MG capsule Take 1 capsule (15 mg total) by mouth 2 (two) times daily. 10/15/12  Yes [name], MD  meclizine (ANTIVERT) 25 MG tablet Take 1/2 to 1 tablet by mouth every 4 hours as needed for dizziness 07/23/12  Yes [name], MD  megestrol (MEGACE ORAL) 40 MG/ML suspension Take 1 teaspoonful by mouth four times daily 07/23/12  Yes [name], MD  mirtazapine (REMERON) 15 MG tablet Take 1 tablet (15 mg total) by mouth at bedtime as needed. 07/29/13  Yes [name], MD  Multiple Vitamins-Minerals (CENTRUM SILVER PO) Take 1 tablet by mouth daily.     Yes Historical Provider, MD  ondansetron (ZOFRAN) 4 MG tablet Take 1 tablet (4 mg total) by mouth every 4 (four) hours as needed for nausea. 04/15/13 04/15/14 Yes [name], MD  sertraline (ZOLOFT) 100 MG tablet Take 1 tablet (100 mg total) by mouth daily. 02/16/13  Yes [name], MD  traMADol (ULTRAM) 50 MG tablet Take 1 tablet (50 mg total) by mouth 3 (three) times daily as needed for pain. 08/19/13  Yes [name], MD  triamcinolone cream (KENALOG) 0.1 % Apply 1 application topically 2 (two) times daily. To affected area 04/29/13  Yes [name], MD  vitamin E 400 UNIT capsule Take 400 Units by mouth daily.     Yes Historical Provider, MD   No Known Allergies  FAMILY HISTORY:  No family history on file. SOCIAL HISTORY:  reports that she quit smoking about 15 years ago. She does not have any smokeless tobacco history on file. She reports that she does not drink alcohol. Her drug history is not on file.  REVIEW OF SYSTEMS:  Unable to obtain due to pt's current status  SUBJECTIVE:   VITAL SIGNS: Temp:  [97.9 F (36.6 C)-98.2 F (36.8 C)] 98.2 F (36.8 C)  (10/10 1311) Pulse Rate:  [78-83] 83 (10/10 1311) Resp:  [18-22] 20 (10/10 1311) BP: (107-124)/(58-67) 115/60 mmHg (10/10 1311) SpO2:  [84 %-99 %] 98 % (10/10 1311) HEMODYNAMICS:   VENTILATOR SETTINGS:   INTAKE / OUTPUT: Intake/Output     10/09 0701 - 10/10 0700 10/10 0701 - 10/11 0700   Total Intake 0     Urine 1000 1800   Total Output 1000 1800   Net -1000 -1800          PHYSICAL EXAMINATION: General:  Small, frail female laying in bed, reading a book, O2 nasal canula in place, NAD Neuro:  Awake and able to respond to questions with severe confusion and  disoriented to place and time of events. HEENT:  Atraumatic, normocephalic, PERRL, moist but pale mucous membranes Cardiovascular:  RRR, 2/6 diastolic murmur, 2+ bilateral pedal pulses Lungs:  Exp/insp rhonchi left>>right Abdomen:  Soft, NT/ND, +BS Skin:  Bilateral ecchymotic shins, no edema  LABS:  PULMONARY  Recent Labs Lab 09/14/13 2258  TCO2 21    CBC  Recent Labs Lab 09/14/13 2350 09/15/13 0645 09/16/13 0930  HGB 10.7* 9.4* 9.8*  HCT 31.9* 28.0* 29.4*  WBC 20.6* 17.9* 14.8*  PLT 243 238 287    COAGULATION  Recent Labs Lab 09/14/13 2350  INR 1.17    CARDIAC  No results found for this basename: TROPONINI,  in the last 168 hours No results found for this basename: PROBNP,  in the last 168 hours   CHEMISTRY  Recent Labs Lab 09/14/13 2258 09/14/13 2350 09/15/13 0645 09/16/13 0930  NA 139 135  --  139  K 3.0* 3.5  --  3.2*  CL 107 103  --  108  CO2  --  23  --  20  GLUCOSE 109* 104*  --  150*  BUN 16 17  --  7  CREATININE 1.10 0.89 0.74 0.67  CALCIUM  --  8.5  --  7.7*  MG  --   --   --  1.7   The CrCl is unknown because both a height and weight (above a minimum accepted value) are required for this calculation.   LIVER  Recent Labs Lab 09/14/13 2350 09/16/13 0930  AST 22 16  ALT 8 16  ALKPHOS 58 61  BILITOT 0.5 0.2*  PROT 6.0 5.4*  ALBUMIN 2.7* 2.1*  INR 1.17  --       INFECTIOUS  Recent Labs Lab 09/14/13 2350  LATICACIDVEN 1.0     ENDOCRINE CBG (last 3)   Recent Labs  09/14/13 2219  GLUCAP 105*         IMAGING x48h  Dg Chest 2 View  09/15/2013   CLINICAL DATA:  Short of breath, chest discomfort  EXAM: CHEST  2 VIEW  COMPARISON:  Prior chest x-ray 04/15/2013  FINDINGS: Interval development of patchy airspace disease throughout the left lower lobe concerning for pneumonia. Possible small parapneumonic effusion. Changes are superimposed on a background of left upper lobectomy with volume loss in the left hemi thorax with right-to-left shift of the cardiac and mediastinal contours. Pulmonary hyperexpansion, emphysema and chronic bronchitic changes remain stable. Atherosclerotic calcifications noted in the transverse aorta. No acute osseous abnormality.  IMPRESSION: 1. Left lower lobe pneumonia. 2. Query small associated parapneumonic effusion. 3. Chronic changes as above.   Electronically Signed   By: Malachy Moan M.D.   On: 09/15/2013 00:11   Ct Head Wo Contrast  09/15/2013   CLINICAL DATA:  Altered mental status  EXAM: CT HEAD WITHOUT CONTRAST  TECHNIQUE: Contiguous axial images were obtained from the base of the skull through the vertex without intravenous contrast.  COMPARISON:  Prior CT scan of the head 06/01/2011  FINDINGS: Negative for acute intracranial hemorrhage, acute infarction, mass, mass effect, hydrocephalus or midline shift. Gray-white differentiation is preserved throughout. Stable global cerebral volume loss. Unchanged moderate periventricular and deep white matter hypoattenuation most consistent with longstanding microvascular ischemia. Lacunar infarct versus dilated perivascular space in the left basal ganglia is unchanged. No acute soft tissue or calvarial abnormalities.  IMPRESSION: No acute intracranial abnormality.  Stable atrophy and chronic microvascular ischemic white matter changes.   Electronically Signed   By:  Malachy Moan M.D.   On: 09/15/2013 00:38   Ct Chest W Contrast  09/16/2013   CLINICAL DATA:  Patient with history of breast cancer and squamous cell lung cancer.  EXAM: CT CHEST WITH CONTRAST  TECHNIQUE: Multidetector CT imaging of the chest was performed during intravenous contrast administration.  CONTRAST:  80mL OMNIPAQUE IOHEXOL 300 MG/ML  SOLN  COMPARISON:  Chest radiograph 09/14/2013; chest CT 11/25/2005.  FINDINGS: The visualized thyroid is unremarkable. No enlarged axillary, mediastinal or hilar lymphadenopathy. The ascending thoracic aorta is dilated 3.5 cm. The main pulmonary artery is enlarged measuring 3.2 cm. Coronary artery calcifications.  Leftward shift of the mediastinum. Diffuse emphysematous change. There is endobronchial material demonstrated within the left mainstem bronchus at the level of the left atrium. Postsurgical changes involving the left hemi thorax. There are extensive consolidative opacities within the remaining left lung. Additionally there are multiple cystic spaces demonstrated within the consolidative opacity in the left lower lung which may represent a combination of underlying emphysematous change and/or necrosis. There is minimal dependent atelectasis within the right lobe as well as associated scattered ground-glass opacity. Small bilateral pleural effusions.  Limited visualization of the upper abdomen demonstrates no focal abnormality. No aggressive or acute appearing osseous lesions. Postsurgical change involving the left posterior 6th rib.  IMPRESSION: 1. Consolidative opacity within the remaining left lung. Additionally there is endobronchial material and occlusion of the left mainstem bronchus. Considerations for the findings within the left lung include pneumonia, postobstructive pneumonitis, aspiration or recurrent malignancy. Considering the endobronchial material within the left mainstem bronchus, correlation with bronchoscopy should be considered to exclude  endobronchial mass. 2. Small bilateral pleural effusions. 3. Dilation of the ascending thoracic aorta. 4. Enlargement of the main pulmonary artery as can be seen with pulmonary arterial hypertension. These results will be called to the ordering clinician or representative by the Radiologist Assistant, and communication documented in the PACS Dashboard.   Electronically Signed   By: Annia Belt M.D.   On: 09/16/2013 07:39      ASSESSMENT / PLAN:  PULMONARY A: CAP vs Aspiration PNA, No criteria for HCAP Hx COPD Hx Squamous cell carcinoma of the lung s/p LL lobectomy 1999  P:   Plan for bronch once patient is stable enough for sedation, will decide w/in next two days IV azithromycin and ceftriaxone, but t=rec expand coverage Repeat chest xray in AM Albuterol and dulera Mucinex BID  CARDIOVASCULAR A:  Inverted t-waves on Anterior leads V1-2 Lactic acid noted, 1.0 10/10 P:  Cardiac monitor Troponin, BNP level  RENAL A:   Hypokalemia, 3.1 10/10 Hypocalcemia, in setting of Osteoporosis P:   IV K+ supplement Follow BMET  GASTROINTESTINAL A:  IBS GERD Malnutrition P:   Cont home Megace, Protonix, bentyl On regular diet, NPO 8 hours prior to Bronch  HEMATOLOGIC A:   Anemia of Chronic Disease P:  Follow CBC SubQ Lovenox for DVT prophylaxis  INFECTIOUS A:   Aspiration PNA vs CAP Leukocytosis, 14.8 10/10, afebrile P:   Iv Azithromycin, Ceftriaxone  ENDOCRINE A:  Osteoporosis P:   Check TSH  NEUROLOGIC A:   AMS, most likely due to hypoxia Hx Anxiety, Depression, chronic pain P:   O2 nasal canula Follow CMET Restart home meds: Remeron, Zoloft, Librium, Gabapentin  Cephus Shelling, Pa-Student, [employer] 09/16/13 16.30   TODAY'S STAFF SUMMARY:  Ms. Shafran will need a bronchoscopy which requires sedation; supsicion for post obstructive pneumonia on left side (same side of remote lung cancer in 1990s). Her  current mental status (delirum) and frail  health and copd makes it unsafe to perform the procedure. We will wait until she is less weak and confused. Will decide based on her clinical course in the next two days. We will continue her current course of antibiotics and monitor her PNA. D/w Dr Joseph Art -rec anaerobc coverage  PCCM will follow  Dr. Kalman Shan, M.D., [facility].C.P Pulmonary and Critical Care Medicine Staff Physician [facility] [place] [facility] Pager: [phone], If no answer or between  15:00h - 7:00h: call [phone]  09/16/2013 5:06 PM

## 2013-09-16 NOTE — Progress Notes (Signed)
INITIAL NUTRITION ASSESSMENT  Pt meets criteria for severe MALNUTRITION in the context of chronic illness as evidenced by pt with visible severe muscle wasting and subcutaneous fat loss in clavicles, upper arms, hands, and temporal/orbital region.  DOCUMENTATION CODES Per approved criteria  -Severe malnutrition in the context of chronic illness -Underweight   INTERVENTION: - Boost Plus BID - Will continue to monitor   NUTRITION DIAGNOSIS: Increased nutrient needs related to malnutrition, cachexia, pnuemonia as evidenced by nutrition focused physical exam and MD notes.   Goal: Pt to consume >90% of meals/supplements   Monitor:  Weights, labs, intake  Reason for Assessment: Nutrition risk, consult   79 y.o. female  Admitting Dx: CAP (community acquired pneumonia)  ASSESSMENT: Pt admitted with confusion, has hx of breast CA, COPD, GERD, IBS, osteoporosis, and alcoholism. Pt cachetic with last weight from 2 months ago at 72 pounds, family reports it is likely pt is still this weight. Family reports pt eats 2 meals/day and drinks Boost occasionally. Pt reports 20 pound unintended weight loss in the past 6 months. Daughter reports pt has been eating poorly for the past 10 years and her maximum weight when she was pregnant was 108 pounds. Pt does not have any teeth but denies needing a special diet. Daughter reports pt has been on Megace for the past year which has helped her appetite. She states pt ate a really good breakfast. Noted pt had a minor fall at home PTA.   Noted pt with low potassium, magnesium WNL.   Nutrition Focused Physical Exam:  Subcutaneous Fat:  Orbital Region: severe wasting Upper Arm Region: severe wasting Thoracic and Lumbar Region: severe wasting  Muscle:  Temple Region: severe wasting Clavicle Bone Region: severe wasting Clavicle and Acromion Bone Region: severe wasting Scapular Bone Region: NA Dorsal Hand: severe wasting  Patellar Region: mild/moderate  wasting Anterior Thigh Region: mild/moderate wasting Posterior Calf Region: mild/moderate wasting  Edema: None noted    Height: Ht Readings from Last 1 Encounters:  08/03/13 5' (1.524 m)    Weight: Wt Readings from Last 1 Encounters:  08/03/13 72 lb 9.6 oz (32.931 kg)    Ideal Body Weight: 100 lb   % Ideal Body Weight: 72%  Wt Readings from Last 10 Encounters:  08/03/13 72 lb 9.6 oz (32.931 kg)  05/13/13 75 lb 6.4 oz (34.2 kg)  04/15/13 75 lb 9.6 oz (34.292 kg)  10/15/12 81 lb 12.8 oz (37.104 kg)  07/23/12 80 lb 12.8 oz (36.651 kg)  06/22/12 79 lb (35.834 kg)  04/09/12 82 lb 9.6 oz (37.467 kg)  10/07/11 79 lb 12.8 oz (36.197 kg)  02/21/11 81 lb 12.8 oz (37.104 kg)  08/23/10 8 lb 8 oz (3.856 kg)    Usual Body Weight: 80 lb per daughter  % Usual Body Weight: 90%  BMI:  14.2 kg/(m^2) Underweight  Estimated Nutritional Needs: Kcal: 1150-1350 Protein: 50-70g Fluid: 1.1-1.3L/day  Skin: Intact   Diet Order: General  EDUCATION NEEDS: -No education needs identified at this time   Intake/Output Summary (Last 24 hours) at 09/16/13 1134 Last data filed at 09/16/13 0905  Gross per 24 hour  Intake      0 ml  Output   1000 ml  Net  -1000 ml    Last BM: PTA   Labs:   Recent Labs Lab 09/14/13 2258 09/14/13 2350 09/15/13 0645 09/16/13 0930  NA 139 135  --  139  K 3.0* 3.5  --  3.2*  CL 107 103  --  108  CO2  --  23  --  20  BUN 16 17  --  7  CREATININE 1.10 0.89 0.74 0.67  CALCIUM  --  8.5  --  7.7*  MG  --   --   --  1.7  GLUCOSE 109* 104*  --  150*    CBG (last 3)   Recent Labs  09/14/13 2219  GLUCAP 105*    Scheduled Meds: . albuterol  2.5 mg Nebulization TID  . azithromycin  500 mg Intravenous Q24H  . cefTRIAXone (ROCEPHIN)  IV  1 g Intravenous Q24H  . dextromethorphan-guaiFENesin  1 tablet Oral BID  . dicyclomine  20 mg Oral TID AC  . enoxaparin (LOVENOX) injection  30 mg Subcutaneous Q24H  . gabapentin  300 mg Oral BID  .  megestrol  800 mg Oral Daily  . mirtazapine  15 mg Oral QHS  . mometasone-formoterol  2 puff Inhalation BID  . pantoprazole  20 mg Oral Daily  . sertraline  100 mg Oral Daily    Continuous Infusions: . sodium chloride 125 mL/hr at 09/16/13 0324    Past Medical History  Diagnosis Date  . Hearing loss   . COPD (chronic obstructive pulmonary disease)   . GERD (gastroesophageal reflux disease)   . IBS (irritable bowel syndrome)   . Gallstones   . Alcoholism   . Adenocarcinoma, breast   . Primary lung squamous cell carcinoma   . DJD (degenerative joint disease)   . Osteoporosis   . Neuropathy   . Anxiety   . Psoriasis   . Anemia     Past Surgical History  Procedure Laterality Date  . Lumbar laminectomy    . 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 Left lung surgery with lllobectomy for squamous cell cancer  09/1998    Dr. Edwyna Shell  . Left breast lumpectomy with sentinel lymph node biopsy    . Left femur fracture repaired  04/2004    Dr. Cherlynn June MS, RD, LDN [phone] Pager [phone] After Hours Pager

## 2013-09-17 ENCOUNTER — Inpatient Hospital Stay (HOSPITAL_COMMUNITY): Payer: Medicare Other

## 2013-09-17 DIAGNOSIS — E876 Hypokalemia: Secondary | ICD-10-CM | POA: Diagnosis present

## 2013-09-17 DIAGNOSIS — I359 Nonrheumatic aortic valve disorder, unspecified: Secondary | ICD-10-CM

## 2013-09-17 LAB — CBC WITH DIFFERENTIAL/PLATELET
Eosinophils Absolute: 0.1 10*3/uL (ref 0.0–0.7)
Eosinophils Relative: 1 % (ref 0–5)
HCT: 27.9 % — ABNORMAL LOW (ref 36.0–46.0)
Hemoglobin: 9.3 g/dL — ABNORMAL LOW (ref 12.0–15.0)
Lymphs Abs: 1.4 10*3/uL (ref 0.7–4.0)
MCH: 30 pg (ref 26.0–34.0)
MCV: 90 fL (ref 78.0–100.0)
Monocytes Absolute: 1.1 10*3/uL — ABNORMAL HIGH (ref 0.1–1.0)
Monocytes Relative: 9 % (ref 3–12)
Platelets: 281 10*3/uL (ref 150–400)
RBC: 3.1 MIL/uL — ABNORMAL LOW (ref 3.87–5.11)

## 2013-09-17 LAB — COMPREHENSIVE METABOLIC PANEL
ALT: 12 U/L (ref 0–35)
BUN: 4 mg/dL — ABNORMAL LOW (ref 6–23)
CO2: 21 mEq/L (ref 19–32)
Calcium: 7.5 mg/dL — ABNORMAL LOW (ref 8.4–10.5)
Creatinine, Ser: 0.6 mg/dL (ref 0.50–1.10)
GFR calc Af Amer: >90 mL/min (ref >90)
GFR calc non Af Amer: 83 mL/min — ABNORMAL LOW (ref >90)
Glucose, Bld: 101 mg/dL — ABNORMAL HIGH (ref 70–99)
Potassium: 3.1 mEq/L — ABNORMAL LOW (ref 3.5–5.1)
Sodium: 143 mEq/L (ref 135–145)
Total Protein: 5 g/dL — ABNORMAL LOW (ref 6.0–8.3)

## 2013-09-17 LAB — MAGNESIUM: Magnesium: 1.9 mg/dL (ref 1.5–2.5)

## 2013-09-17 LAB — TSH: TSH: 1.782 u[IU]/mL (ref 0.350–4.500)

## 2013-09-17 MED ORDER — POTASSIUM CHLORIDE 10 MEQ/100ML IV SOLN
10.0000 meq | INTRAVENOUS | Status: AC
  Administered 2013-09-17: 10 meq via INTRAVENOUS
  Filled 2013-09-17 (×2): qty 100

## 2013-09-17 MED ORDER — POTASSIUM CHLORIDE 10 MEQ/100ML IV SOLN
10.0000 meq | INTRAVENOUS | Status: AC
  Administered 2013-09-17 (×3): 10 meq via INTRAVENOUS
  Filled 2013-09-17 (×3): qty 100

## 2013-09-17 MED ORDER — ALBUTEROL SULFATE (5 MG/ML) 0.5% IN NEBU: 2.5000 mg | INHALATION_SOLUTION | RESPIRATORY_TRACT | Status: DC | PRN

## 2013-09-17 NOTE — Progress Notes (Signed)
Subjective: No change overnight.  Feels breathing is stable, and no increased wob.  Objective: Vital signs in last 24 hours: Blood pressure 155/76, pulse 75, temperature 98.5 F (36.9 C), temperature source Oral, resp. rate 20, height 4' 11.84" (1.52 m), weight 32.9 kg (72 lb 8.5 oz), SpO2 96.00%.  Intake/Output from previous day: 10/10 0701 - 10/11 0700 In: 1911.7 [I.V.:1911.7] Out: 2901 [Urine:2900; Stool:1]   Physical Exam:   thin female in nad Nose without purulence or d/c noted, OP clear, neck without LN or TMG Chest with decreased bs, a few crackles and rhonchi, no wheezing.   Lab Results:  Recent Labs  09/15/13 0645 09/16/13 0930 09/17/13 0617  WBC 17.9* 14.8* 11.4*  HGB 9.4* 9.8* 9.3*  HCT 28.0* 29.4* 27.9*  PLT 238 287 281   BMET  Recent Labs  09/14/13 2350 09/15/13 0645 09/16/13 0930 09/17/13 0617  NA 135  --  139 143  K 3.5  --  3.2* 3.1*  CL 103  --  108 114*  CO2 23  --  20 21  GLUCOSE 104*  --  150* 101*  BUN 17  --  7 4*  CREATININE 0.89 0.74 0.67 0.60  CALCIUM 8.5  --  7.7* 7.5*    Studies/Results: Ct Chest W Contrast  09/16/2013   CLINICAL DATA:  Patient with history of breast cancer and squamous cell lung cancer.  EXAM: CT CHEST WITH CONTRAST  TECHNIQUE: Multidetector CT imaging of the chest was performed during intravenous contrast administration.  CONTRAST:  80mL OMNIPAQUE IOHEXOL 300 MG/ML  SOLN  COMPARISON:  Chest radiograph 09/14/2013; chest CT 11/25/2005.  FINDINGS: The visualized thyroid is unremarkable. No enlarged axillary, mediastinal or hilar lymphadenopathy. The ascending thoracic aorta is dilated 3.5 cm. The main pulmonary artery is enlarged measuring 3.2 cm. Coronary artery calcifications.  Leftward shift of the mediastinum. Diffuse emphysematous change. There is endobronchial material demonstrated within the left mainstem bronchus at the level of the left atrium. Postsurgical changes involving the left hemi thorax. There are  extensive consolidative opacities within the remaining left lung. Additionally there are multiple cystic spaces demonstrated within the consolidative opacity in the left lower lung which may represent a combination of underlying emphysematous change and/or necrosis. There is minimal dependent atelectasis within the right lobe as well as associated scattered ground-glass opacity. Small bilateral pleural effusions.  Limited visualization of the upper abdomen demonstrates no focal abnormality. No aggressive or acute appearing osseous lesions. Postsurgical change involving the left posterior 6th rib.  IMPRESSION: 1. Consolidative opacity within the remaining left lung. Additionally there is endobronchial material and occlusion of the left mainstem bronchus. Considerations for the findings within the left lung include pneumonia, postobstructive pneumonitis, aspiration or recurrent malignancy. Considering the endobronchial material within the left mainstem bronchus, correlation with bronchoscopy should be considered to exclude endobronchial mass. 2. Small bilateral pleural effusions. 3. Dilation of the ascending thoracic aorta. 4. Enlargement of the main pulmonary artery as can be seen with pulmonary arterial hypertension. These results will be called to the ordering clinician or representative by the Radiologist Assistant, and communication documented in the PACS Dashboard.   Electronically Signed   By: Annia Belt M.D.   On: 09/16/2013 07:39   Dg Chest Port 1 View  09/17/2013   CLINICAL DATA:  Followup pneumonia  EXAM: PORTABLE CHEST - 1 VIEW  COMPARISON:  CT, 09/15/2013. Chest radiograph, 09/14/2013.  FINDINGS: Consolidation in the remaining left lung has mildly increased most evident in the upper lung zone.  There is dense consolidation at the base obscuring the heart border and hemidiaphragm.  The right lung is hyperexpanded. There are irregularly thickened interstitial markings with some hazy basilar airspace  opacity, which may reflect a new area of pneumonia.  No pneumothorax. Changes from left lung surgery are stable.  IMPRESSION: 1. Mild increase in left lung consolidation when compared to the prior chest radiograph. 2. Mild hazy airspace opacity at the right lung base may reflect a new area of pneumonia or be due to atelectasis.   Electronically Signed   By: Amie Portland M.D.   On: 09/17/2013 07:30    Assessment/Plan:  1) acute on chronic respiratory failure secondary to underlying copd with what appears to be a LLL pna.  cxr today with some increased density in LLL, but the pt is clinically stable with no fever or increased wob.   -continue aggressive BD regimen -f/u culture data. - would repeat cxr Monday am -the question has been raised about need for possible FOB.  Obviously, this will have to wait until the pt is more stable.   Will check again on Monday.  Please call if issues arise.     Barbaraann Share, M.D. 09/17/2013, 3:04 PM

## 2013-09-17 NOTE — Progress Notes (Signed)
[facility] PROGRESS NOTE  Leslie [name] ZOX:[id number] DOB: 11-17-1929 DOA: 09/14/2013 PCP: [name], MD  Assessment/Plan: 1. LLL pneumonia HCAP; although patient does not quite meet the criteria for HCAP we will broaden the antibiotic coverage to meet this criteria. Discussed with pulmonology . -Will DC azithromycin and ceftriaxone -Continue  Zosyn+ vancomycin -Continue patient on albuterol nebulizers,  Dulera, obtain RR/SpO2 vitals every 4 hours -Continue Mucinex -continue pulmonary toilet -Increase normal saline to 164ml/hr -Patient's WBC continues to decrease currently= 11.4, down from a high of 17.9 at admission  2. LT parapneumonic effusion; Obtain chest CT with and without contrast rule out recurrence of squamous cell lung cancer vs adenocarcinoma of the breast; see results below -Consulted Plmonology (Dr Kalman Shan) who agrees patient needs a bronchoscopy however feels patient is too weak currently and will defer until patient's stronger  3. COPD; continue O2 to maintain SPO2> 92% -Continue patient's albuterol nebulizer -Continue Dulera -RR/SpO2 vitals every 4 hours  4. Anxiety; Continue Chlordiazepoxide (home medication)  5. Irritable bowel syndrome; Continue Bentyl (home medication)   6.  Depression;  Start Remeron + Zoloft (home regimen)  7. Malnutrition; restart Megace 800 mg daily -Nutrition she ate boost plus  8. Altered mental status; patient remains mildly confused, but pleasant. Cooperate with cardiology Tech during her echocardiogram  9. chronic pain; continue home medication  10. Pulmonary hypertension; patient has not had a echocardiogram will obtain to better quantify her cardiac status. Echocardiogram pending  11. Hypokalemia; will replete  Code Status: DNR Family Communication: Discussed plan of care with both daughters and granddaughter. Bridget Hartshorn has medical power of attorney [phone] Disposition Plan:     Consultants:  Pulmonology (Dr Kalman Shan)   Procedures:  CXR 09/15/2013 Interval development of patchy airspace disease throughout the left  lower lobe concerning for pneumonia. - Possible small parapneumonic effusion. - left upper lobectomy with volume loss in the left hemi thorax with right-to-left shift of the cardiac and mediastinal contours.  -Pulmonary hyperexpansion, emphysema and chronic bronchitic changes remain stable.   CT head without contrast 09/15/2013 No acute intracranial abnormality.  Stable atrophy and chronic microvascular ischemic white matter  Changes.  CT chest with contrast 09/15/2013;  1. Consolidative opacity within the remaining left lung.  Additionally there is endobronchial material and occlusion of the  left mainstem bronchus. Considerations for the findings within the  left lung include pneumonia, postobstructive pneumonitis, aspiration  or recurrent malignancy. Considering the endobronchial material  within the left mainstem bronchus, correlation with bronchoscopy  should be considered to exclude endobronchial mass.  2. Small bilateral pleural effusions.  3. Dilation of the ascending thoracic aorta.  4. Enlargement of the main pulmonary artery as can be seen with  pulmonary arterial hypertension.   Echocardiogram;  Results pending   Antibiotics:  Azithromycin 10/10>>>D/Ced 10/10   Ceftriaxone 10/9>>D/Ced 10/10   Zosyn 10/10 >>  Vancomycin 10/10 >>    HPI/Subjective: 79 yo WF PMHx IBS, Osteoporosis, squamous cell lung cancer (s/p left thoracotomy w/ LLLobectomy for squamous cell ca 10/99 by Dr Edwyna Shell... no known recurrence)., adenocarcinoma of the breast (left breast lumpectomy and sentinel node biopsy 12/03 by Dr Ezzard Standing for a 2.2cm infiltrating carcinoma, neg LN's, ER/PR pos, treated w/ XRT, then Tamoxifen for 58yrs), COPD, alcoholism, anxiety, anemia, lives at home with son comes in with worsening confusion today, no fevers.  Daughter with her now. No n/v/d. No rashes. No cough. No sob. No complaints of pain. She was just more confused than normal. Fairly  independent at home. Has not complaints right now in ED. cxr shows pna. No recent abx. 09/15/2013 states feels mildly SOB while on 2 L 02 via . States does not use O2 at home. Negative CP. 09/16/2013 patient confused today however pleasant. Follows commands but answers questions inappropriately. TODAY patient still mildly confused however very cooperative, decreased WOB, however still becomes SOB when off her O2     Objective: Filed Vitals:   09/16/13 2042 09/16/13 2155 09/17/13 0637 09/17/13 0757  BP:  144/76 155/76   Pulse:  87 75   Temp:  98.6 F (37 C) 98.5 F (36.9 C)   TempSrc:  Oral Oral   Resp:  20 20   Height:      Weight:      SpO2: 97% 92% 93% 96%    Intake/Output Summary (Last 24 hours) at 09/17/13 1256 Last data filed at 09/17/13 0900  Gross per 24 hour  Intake 2031.67 ml  Output   2801 ml  Net -769.33 ml   Filed Weights   09/16/13 1700  Weight: 32.9 kg (72 lb 8.5 oz)    Exam:   General: A./O. x1, increased confusion, follows commands   Cardiovascular:, Tachycardic, negative murmurs rubs or gallops, DP/PT pulse 2+ bilateral   Respiratory: Rt Lung CTA (coarse breath sounds), Lt Lung clear to auscultation in the apex, poor air movement in the remainder of the left lung, with expiratory wheezing audible today   Abdomen: Soft, nontender, nondistended plus bowel sounds  Musculoskeletal: Very cachectic female, negative pedal edema   Data Reviewed: Basic Metabolic Panel:  Recent Labs Lab 09/14/13 2258 09/14/13 2350 09/15/13 0645 09/16/13 0930 09/16/13 1832 09/17/13 0617  NA 139 135  --  139  --  143  K 3.0* 3.5  --  3.2*  --  3.1*  CL 107 103  --  108  --  114*  CO2  --  23  --  20  --  21  GLUCOSE 109* 104*  --  150*  --  101*  BUN 16 17  --  7  --  4*  CREATININE 1.10 0.89 0.74 0.67  --  0.60  CALCIUM  --  8.5  --   7.7*  --  7.5*  MG  --   --   --  1.7  --   --   PHOS  --   --   --   --  2.3  --    Liver Function Tests:  Recent Labs Lab 09/14/13 2350 09/16/13 0930 09/17/13 0617  AST 22 16 17   ALT 8 16 12   ALKPHOS 58 61 73  BILITOT 0.5 0.2* 0.3  PROT 6.0 5.4* 5.0*  ALBUMIN 2.7* 2.1* 1.9*   No results found for this basename: LIPASE, AMYLASE,  in the last 168 hours No results found for this basename: AMMONIA,  in the last 168 hours CBC:  Recent Labs Lab 09/14/13 2258 09/14/13 2350 09/15/13 0645 09/16/13 0930 09/17/13 0617  WBC  --  20.6* 17.9* 14.8* 11.4*  NEUTROABS  --  16.4*  --  11.3* 8.9*  HGB 11.6* 10.7* 9.4* 9.8* 9.3*  HCT 34.0* 31.9* 28.0* 29.4* 27.9*  MCV  --  88.9 90.0 89.9 90.0  PLT  --  243 238 287 281   Cardiac Enzymes:  Recent Labs Lab 09/16/13 1832  TROPONINI <0.30   BNP (last 3 results)  Recent Labs  09/16/13 1832  PROBNP 3115.0*   CBG:  Recent Labs Lab 09/14/13  2219  GLUCAP 105*    Recent Results (from the past 240 hour(s))  CULTURE, BLOOD (ROUTINE X 2)     Status: None   Collection Time    09/15/13  6:45 AM      Result Value Range Status   Specimen Description BLOOD LEFT ARM   Final   Special Requests BOTTLES DRAWN AEROBIC ONLY 1CC   Final   Culture  Setup Time     Final   Value: 09/15/2013 10:29     Performed at [facility]   Culture     Final   Value:        BLOOD CULTURE RECEIVED NO GROWTH TO DATE CULTURE WILL BE HELD FOR 5 DAYS BEFORE ISSUING A FINAL NEGATIVE REPORT     Performed at [facility]   Report Status PENDING   Incomplete  CULTURE, BLOOD (ROUTINE X 2)     Status: None   Collection Time    09/15/13  6:55 AM      Result Value Range Status   Specimen Description BLOOD LEFT HAND   Final   Special Requests     Final   Value: BOTTLES DRAWN AEROBIC AND ANAEROBIC 3CC AER 2CC ANA   Culture  Setup Time     Final   Value: 09/15/2013 10:29     Performed at [facility]   Culture     Final   Value:         BLOOD CULTURE RECEIVED NO GROWTH TO DATE CULTURE WILL BE HELD FOR 5 DAYS BEFORE ISSUING A FINAL NEGATIVE REPORT     Performed at [facility]   Report Status PENDING   Incomplete  MRSA PCR SCREENING     Status: None   Collection Time    09/16/13  6:31 PM      Result Value Range Status   MRSA by PCR NEGATIVE  NEGATIVE Final   Comment:            The GeneXpert MRSA Assay (FDA     approved for NASAL specimens     only), is one component of a     comprehensive MRSA colonization     surveillance program. It is not     intended to diagnose MRSA     infection nor to guide or     monitor treatment for     MRSA infections.     Studies: Ct Chest W Contrast  09/16/2013   CLINICAL DATA:  Patient with history of breast cancer and squamous cell lung cancer.  EXAM: CT CHEST WITH CONTRAST  TECHNIQUE: Multidetector CT imaging of the chest was performed during intravenous contrast administration.  CONTRAST:  80mL OMNIPAQUE IOHEXOL 300 MG/ML  SOLN  COMPARISON:  Chest radiograph 09/14/2013; chest CT 11/25/2005.  FINDINGS: The visualized thyroid is unremarkable. No enlarged axillary, mediastinal or hilar lymphadenopathy. The ascending thoracic aorta is dilated 3.5 cm. The main pulmonary artery is enlarged measuring 3.2 cm. Coronary artery calcifications.  Leftward shift of the mediastinum. Diffuse emphysematous change. There is endobronchial material demonstrated within the left mainstem bronchus at the level of the left atrium. Postsurgical changes involving the left hemi thorax. There are extensive consolidative opacities within the remaining left lung. Additionally there are multiple cystic spaces demonstrated within the consolidative opacity in the left lower lung which may represent a combination of underlying emphysematous change and/or necrosis. There is minimal dependent atelectasis within the right lobe as well as associated scattered ground-glass opacity.  Small bilateral pleural effusions.   Limited visualization of the upper abdomen demonstrates no focal abnormality. No aggressive or acute appearing osseous lesions. Postsurgical change involving the left posterior 6th rib.  IMPRESSION: 1. Consolidative opacity within the remaining left lung. Additionally there is endobronchial material and occlusion of the left mainstem bronchus. Considerations for the findings within the left lung include pneumonia, postobstructive pneumonitis, aspiration or recurrent malignancy. Considering the endobronchial material within the left mainstem bronchus, correlation with bronchoscopy should be considered to exclude endobronchial mass. 2. Small bilateral pleural effusions. 3. Dilation of the ascending thoracic aorta. 4. Enlargement of the main pulmonary artery as can be seen with pulmonary arterial hypertension. These results will be called to the ordering clinician or representative by the Radiologist Assistant, and communication documented in the PACS Dashboard.   Electronically Signed   By: Annia Belt M.D.   On: 09/16/2013 07:39   Dg Chest Port 1 View  09/17/2013   CLINICAL DATA:  Followup pneumonia  EXAM: PORTABLE CHEST - 1 VIEW  COMPARISON:  CT, 09/15/2013. Chest radiograph, 09/14/2013.  FINDINGS: Consolidation in the remaining left lung has mildly increased most evident in the upper lung zone. There is dense consolidation at the base obscuring the heart border and hemidiaphragm.  The right lung is hyperexpanded. There are irregularly thickened interstitial markings with some hazy basilar airspace opacity, which may reflect a new area of pneumonia.  No pneumothorax. Changes from left lung surgery are stable.  IMPRESSION: 1. Mild increase in left lung consolidation when compared to the prior chest radiograph. 2. Mild hazy airspace opacity at the right lung base may reflect a new area of pneumonia or be due to atelectasis.   Electronically Signed   By: Amie Portland M.D.   On: 09/17/2013 07:30    Scheduled  Meds: . albuterol  2.5 mg Nebulization TID  . dextromethorphan-guaiFENesin  1 tablet Oral BID  . dicyclomine  20 mg Oral TID AC  . enoxaparin (LOVENOX) injection  30 mg Subcutaneous Q24H  . gabapentin  300 mg Oral BID  . lactose free nutrition  237 mL Oral BID BM  . megestrol  800 mg Oral Daily  . mirtazapine  15 mg Oral QHS  . mometasone-formoterol  2 puff Inhalation BID  . pantoprazole  20 mg Oral Daily  . piperacillin-tazobactam (ZOSYN)  IV  3.375 g Intravenous Q8H  . sertraline  100 mg Oral Daily  . vancomycin  500 mg Intravenous Q24H   Continuous Infusions: . sodium chloride 150 mL/hr at 09/16/13 2342    Principal Problem:   CAP (community acquired pneumonia) Active Problems:   CARCINOMA, LUNG, SQUAMOUS CELL   ADENOCARCINOMA, BREAST   ANXIETY   ALCOHOLISM   HEARING LOSS   COPD   PSORIASIS   Memory loss   Acute respiratory failure with hypoxia   Altered mental status   Chronic pain syndrome   Parapneumonic effusion   Protein-calorie malnutrition, severe    Time spent: 35 minutes   WOODS, CURTIS, J  [facility] Pager [phone]. If 7PM-7AM, please contact night-coverage at [email or web address], password [facility] 09/17/2013, 12:56 PM  LOS: 3 days

## 2013-09-17 NOTE — Progress Notes (Signed)
  Echocardiogram 2D Echocardiogram has been performed.  Arvil Chaco 09/17/2013, 3:54 PM

## 2013-09-18 LAB — CBC WITH DIFFERENTIAL/PLATELET
Basophils Absolute: 0 10*3/uL (ref 0.0–0.1)
Basophils Relative: 0 % (ref 0–1)
Eosinophils Absolute: 0.1 10*3/uL (ref 0.0–0.7)
MCH: 29.6 pg (ref 26.0–34.0)
MCHC: 32.8 g/dL (ref 30.0–36.0)
Neutrophils Relative %: 72 % (ref 43–77)
Platelets: 295 10*3/uL (ref 150–400)
RDW: 16.8 % — ABNORMAL HIGH (ref 11.5–15.5)

## 2013-09-18 LAB — COMPREHENSIVE METABOLIC PANEL
ALT: 11 U/L (ref 0–35)
AST: 14 U/L (ref 0–37)
Albumin: 2 g/dL — ABNORMAL LOW (ref 3.5–5.2)
Alkaline Phosphatase: 69 U/L (ref 39–117)
CO2: 21 mEq/L (ref 19–32)
Chloride: 113 mEq/L — ABNORMAL HIGH (ref 96–112)
Creatinine, Ser: 0.62 mg/dL (ref 0.50–1.10)
GFR calc non Af Amer: 82 mL/min — ABNORMAL LOW (ref >90)
Total Bilirubin: 0.2 mg/dL — ABNORMAL LOW (ref 0.3–1.2)

## 2013-09-18 LAB — MAGNESIUM: Magnesium: 1.9 mg/dL (ref 1.5–2.5)

## 2013-09-18 MED ORDER — MENTHOL 3 MG MT LOZG
1.0000 | LOZENGE | OROMUCOSAL | Status: DC | PRN
  Filled 2013-09-18: qty 9

## 2013-09-18 NOTE — Progress Notes (Signed)
[facility] PROGRESS NOTE  Leslie [name] ZOX:[id number] DOB: 01-Jun-1929 DOA: 09/14/2013 PCP: [name], MD  Assessment/Plan: 1. LLL pneumonia HCAP; although patient does not quite meet the criteria for HCAP we will broaden the antibiotic coverage to meet this criteria. Discussed with pulmonology . -Will DC azithromycin and ceftriaxone -Continue  Zosyn+ vancomycin -Continue patient on albuterol nebulizers,  Dulera, obtain RR/SpO2 vitals every 4 hours -Continue Mucinex -continue pulmonary toilet -Increase normal saline to 176ml/hr -Patient's WBC continues to decrease currently= 8.8, down from a high of 17.9 at admission  2. LT parapneumonic effusion; Chest CT with and without contrast rule out recurrence of squamous cell lung cancer vs adenocarcinoma of the breast; see results below -Consulted Plmonology (Dr Kalman Shan) who agrees patient needs a bronchoscopy however feels patient is too weak currently and will defer until patient's stronger.   3. COPD; continue O2 to maintain SPO2> 92% -Continue patient's albuterol nebulizer -Continue Dulera -RR/SpO2 vitals every 4 hours  4. Anxiety; Continue Chlordiazepoxide (home medication)  5. Irritable bowel syndrome; Continue Bentyl (home medication)   6.  Depression;  Continue Remeron + Zoloft (home regimen)  7. Malnutrition; restart Megace 800 mg daily -Per daughter she is eating better   8. Altered mental status; patient remains mildly confused, but pleasant.   9. chronic pain; continue home medication  10. Pulmonary hypertension; Echocardiogram complete see results below  11. Hypokalemia; will replete  Code Status: DNR Family Communication: Discussed plan of care with both daughters and granddaughter. Bridget Hartshorn has medical power of attorney [phone] Disposition Plan:    Consultants:  Pulmonology (Dr Kalman Shan)   Procedures:  CXR 09/15/2013 Interval development of patchy airspace disease  throughout the left  lower lobe concerning for pneumonia. - Possible small parapneumonic effusion. - left upper lobectomy with volume loss in the left hemi thorax with right-to-left shift of the cardiac and mediastinal contours.  -Pulmonary hyperexpansion, emphysema and chronic bronchitic changes remain stable.   CT head without contrast 09/15/2013 No acute intracranial abnormality.  Stable atrophy and chronic microvascular ischemic white matter  Changes.  CT chest with contrast 09/15/2013;  1. Consolidative opacity within the remaining left lung.  Additionally there is endobronchial material and occlusion of the  left mainstem bronchus. Considerations for the findings within the  left lung include pneumonia, postobstructive pneumonitis, aspiration  or recurrent malignancy. Considering the endobronchial material  within the left mainstem bronchus, correlation with bronchoscopy  should be considered to exclude endobronchial mass.  2. Small bilateral pleural effusions.  3. Dilation of the ascending thoracic aorta.  4. Enlargement of the main pulmonary artery as can be seen with  pulmonary arterial hypertension.   Echocardiogram;   - Left ventricle: The cavity size was normal. Wall thickness was normal. Systolic function was normal.  -LVEF=  60% to 65%.  -(grade 1 diastolicdysfunction). - Aortic valve: Mild regurgitation. - Mitral valve: There is a mild prolapse of the posterior leaflet of the mitral valve without any associated functional abnormality. - Atrial septum: No defect or patent foramen ovale was identified. - Pericardium, extracardiac: A trivial pericardial effusion was identified.    Antibiotics:  Azithromycin 10/10>>>D/Ced 10/10   Ceftriaxone 10/9>>D/Ced 10/10   Zosyn 10/10 >>  Vancomycin 10/10 >>    HPI/Subjective: 79 yo WF PMHx IBS, Osteoporosis, squamous cell lung cancer (s/p left thoracotomy w/ LLLobectomy for squamous cell ca 10/99 by Dr Edwyna Shell... no  known recurrence)., adenocarcinoma of the breast (left breast lumpectomy and sentinel node biopsy 12/03 by Dr Ezzard Standing  for a 2.2cm infiltrating carcinoma, neg LN's, ER/PR pos, treated w/ XRT, then Tamoxifen for 14yrs), COPD, alcoholism, anxiety, anemia, lives at home with son comes in with worsening confusion today, no fevers. Daughter with her now. No n/v/d. No rashes. No cough. No sob. No complaints of pain. She was just more confused than normal. Fairly independent at home. Has not complaints right now in ED. cxr shows pna. No recent abx. 09/15/2013 states feels mildly SOB while on 2 L 02 via . States does not use O2 at home. Negative CP. 09/16/2013 patient confused today however pleasant. Follows commands but answers questions inappropriately. 09/17/2013 patient still mildly confused however very cooperative, decreased WOB, however still becomes SOB when off her O2. TODAY patient pleasantly confused, however does recognize family members in the room.     Objective: Filed Vitals:   09/17/13 1557 09/17/13 2024 09/17/13 2206 09/18/13 0620  BP: 156/74  144/63 148/77  Pulse: 80  82 71  Temp: 99 F (37.2 C)  98.9 F (37.2 C) 97.9 F (36.6 C)  TempSrc: Oral  Oral Oral  Resp: 20  20 20   Height:      Weight:      SpO2: 97% 89% 95% 100%    Intake/Output Summary (Last 24 hours) at 09/18/13 0846 Last data filed at 09/17/13 1809  Gross per 24 hour  Intake    240 ml  Output    650 ml  Net   -410 ml   Filed Weights   09/16/13 1700  Weight: 32.9 kg (72 lb 8.5 oz)    Exam:   General: A./O. x1, continued confusion, follows commands (daughter states patient always confused when hospitalized)  Cardiovascular:, Regular rhythm and rate , negative murmurs rubs or gallops, DP/PT pulse 2+ bilateral   Respiratory: Rt Lung CTA (coarse breath sounds but clearing), Lt Lung clear to auscultation in the apex, poor air movement in the remainder of the left lung, with expiratory wheezing audible     Abdomen: Soft, nontender, nondistended plus bowel sounds  Musculoskeletal: Very cachectic female, negative pedal edema   Data Reviewed: Basic Metabolic Panel:  Recent Labs Lab 09/14/13 2258 09/14/13 2350 09/15/13 0645 09/16/13 0930 09/16/13 1832 09/17/13 0617  NA 139 135  --  139  --  143  K 3.0* 3.5  --  3.2*  --  3.1*  CL 107 103  --  108  --  114*  CO2  --  23  --  20  --  21  GLUCOSE 109* 104*  --  150*  --  101*  BUN 16 17  --  7  --  4*  CREATININE 1.10 0.89 0.74 0.67  --  0.60  CALCIUM  --  8.5  --  7.7*  --  7.5*  MG  --   --   --  1.7  --  1.9  PHOS  --   --   --   --  2.3  --    Liver Function Tests:  Recent Labs Lab 09/14/13 2350 09/16/13 0930 09/17/13 0617  AST 22 16 17   ALT 8 16 12   ALKPHOS 58 61 73  BILITOT 0.5 0.2* 0.3  PROT 6.0 5.4* 5.0*  ALBUMIN 2.7* 2.1* 1.9*   No results found for this basename: LIPASE, AMYLASE,  in the last 168 hours No results found for this basename: AMMONIA,  in the last 168 hours CBC:  Recent Labs Lab 09/14/13 2258 09/14/13 2350 09/15/13 0645 09/16/13 0930 09/17/13 0617  WBC  --  20.6* 17.9* 14.8* 11.4*  NEUTROABS  --  16.4*  --  11.3* 8.9*  HGB 11.6* 10.7* 9.4* 9.8* 9.3*  HCT 34.0* 31.9* 28.0* 29.4* 27.9*  MCV  --  88.9 90.0 89.9 90.0  PLT  --  243 238 287 281   Cardiac Enzymes:  Recent Labs Lab 09/16/13 1832  TROPONINI <0.30   BNP (last 3 results)  Recent Labs  09/16/13 1832  PROBNP 3115.0*   CBG:  Recent Labs Lab 09/14/13 2219  GLUCAP 105*    Recent Results (from the past 240 hour(s))  CULTURE, BLOOD (ROUTINE X 2)     Status: None   Collection Time    09/15/13  6:45 AM      Result Value Range Status   Specimen Description BLOOD LEFT ARM   Final   Special Requests BOTTLES DRAWN AEROBIC ONLY 1CC   Final   Culture  Setup Time     Final   Value: 09/15/2013 10:29     Performed at [facility]   Culture     Final   Value:        BLOOD CULTURE RECEIVED NO GROWTH TO DATE CULTURE  WILL BE HELD FOR 5 DAYS BEFORE ISSUING A FINAL NEGATIVE REPORT     Performed at [facility]   Report Status PENDING   Incomplete  CULTURE, BLOOD (ROUTINE X 2)     Status: None   Collection Time    09/15/13  6:55 AM      Result Value Range Status   Specimen Description BLOOD LEFT HAND   Final   Special Requests     Final   Value: BOTTLES DRAWN AEROBIC AND ANAEROBIC 3CC AER 2CC ANA   Culture  Setup Time     Final   Value: 09/15/2013 10:29     Performed at [facility]   Culture     Final   Value:        BLOOD CULTURE RECEIVED NO GROWTH TO DATE CULTURE WILL BE HELD FOR 5 DAYS BEFORE ISSUING A FINAL NEGATIVE REPORT     Performed at [facility]   Report Status PENDING   Incomplete  MRSA PCR SCREENING     Status: None   Collection Time    09/16/13  6:31 PM      Result Value Range Status   MRSA by PCR NEGATIVE  NEGATIVE Final   Comment:            The GeneXpert MRSA Assay (FDA     approved for NASAL specimens     only), is one component of a     comprehensive MRSA colonization     surveillance program. It is not     intended to diagnose MRSA     infection nor to guide or     monitor treatment for     MRSA infections.     Studies: Dg Chest Port 1 View  09/17/2013   CLINICAL DATA:  Followup pneumonia  EXAM: PORTABLE CHEST - 1 VIEW  COMPARISON:  CT, 09/15/2013. Chest radiograph, 09/14/2013.  FINDINGS: Consolidation in the remaining left lung has mildly increased most evident in the upper lung zone. There is dense consolidation at the base obscuring the heart border and hemidiaphragm.  The right lung is hyperexpanded. There are irregularly thickened interstitial markings with some hazy basilar airspace opacity, which may reflect a new area of pneumonia.  No pneumothorax. Changes from left lung surgery  are stable.  IMPRESSION: 1. Mild increase in left lung consolidation when compared to the prior chest radiograph. 2. Mild hazy airspace opacity at the right lung base  may reflect a new area of pneumonia or be due to atelectasis.   Electronically Signed   By: Amie Portland M.D.   On: 09/17/2013 07:30    Scheduled Meds: . albuterol  2.5 mg Nebulization TID  . dextromethorphan-guaiFENesin  1 tablet Oral BID  . dicyclomine  20 mg Oral TID AC  . enoxaparin (LOVENOX) injection  30 mg Subcutaneous Q24H  . gabapentin  300 mg Oral BID  . lactose free nutrition  237 mL Oral BID BM  . megestrol  800 mg Oral Daily  . mirtazapine  15 mg Oral QHS  . mometasone-formoterol  2 puff Inhalation BID  . pantoprazole  20 mg Oral Daily  . piperacillin-tazobactam (ZOSYN)  IV  3.375 g Intravenous Q8H  . sertraline  100 mg Oral Daily  . vancomycin  500 mg Intravenous Q24H   Continuous Infusions: . sodium chloride 150 mL/hr at 09/16/13 2342    Principal Problem:   CAP (community acquired pneumonia) Active Problems:   CARCINOMA, LUNG, SQUAMOUS CELL   ADENOCARCINOMA, BREAST   ANXIETY   ALCOHOLISM   HEARING LOSS   COPD   PSORIASIS   Memory loss   Acute respiratory failure with hypoxia   Altered mental status   Chronic pain syndrome   Parapneumonic effusion   Protein-calorie malnutrition, severe   Hypokalemia    Time spent: 35 minutes   Marleen Moret, J  [facility] Pager [phone]. If 7PM-7AM, please contact night-coverage at [email or web address], password [facility] 09/18/2013, 8:46 AM  LOS: 4 days

## 2013-09-19 ENCOUNTER — Encounter (HOSPITAL_COMMUNITY)
Admission: EM | Disposition: A | Discharge status: Skilled Nursing Facility | Payer: Medicare Other | Source: Home / Self Care | Attending: Internal Medicine

## 2013-09-19 ENCOUNTER — Encounter (HOSPITAL_COMMUNITY): Payer: Self-pay | Admitting: Respiratory Therapy

## 2013-09-19 ENCOUNTER — Inpatient Hospital Stay (HOSPITAL_COMMUNITY): Payer: Medicare Other

## 2013-09-19 ENCOUNTER — Telehealth: Payer: Self-pay | Admitting: Pulmonary Disease

## 2013-09-19 HISTORY — PX: VIDEO BRONCHOSCOPY: SHX5072

## 2013-09-19 LAB — CBC WITH DIFFERENTIAL/PLATELET
Basophils Relative: 0 % (ref 0–1)
HCT: 29.1 % — ABNORMAL LOW (ref 36.0–46.0)
Hemoglobin: 9.5 g/dL — ABNORMAL LOW (ref 12.0–15.0)
Lymphocytes Relative: 20 % (ref 12–46)
Lymphs Abs: 1.9 10*3/uL (ref 0.7–4.0)
MCH: 29.4 pg (ref 26.0–34.0)
MCHC: 32.6 g/dL (ref 30.0–36.0)
Monocytes Absolute: 1 10*3/uL (ref 0.1–1.0)
Monocytes Relative: 10 % (ref 3–12)
Neutro Abs: 6.7 10*3/uL (ref 1.7–7.7)
Platelets: 363 10*3/uL (ref 150–400)
RBC: 3.23 MIL/uL — ABNORMAL LOW (ref 3.87–5.11)
WBC: 9.7 10*3/uL (ref 4.0–10.5)

## 2013-09-19 LAB — COMPREHENSIVE METABOLIC PANEL
ALT: 13 U/L (ref 0–35)
AST: 15 U/L (ref 0–37)
Calcium: 8.2 mg/dL — ABNORMAL LOW (ref 8.4–10.5)
GFR calc Af Amer: >90 mL/min (ref >90)
Sodium: 142 mEq/L (ref 135–145)
Total Bilirubin: 0.2 mg/dL — ABNORMAL LOW (ref 0.3–1.2)
Total Protein: 5.1 g/dL — ABNORMAL LOW (ref 6.0–8.3)

## 2013-09-19 LAB — MAGNESIUM: Magnesium: 1.8 mg/dL (ref 1.5–2.5)

## 2013-09-19 SURGERY — BRONCHOSCOPY, WITH FLUOROSCOPY
Anesthesia: Moderate Sedation | Laterality: Bilateral

## 2013-09-19 MED ORDER — MIDAZOLAM HCL 10 MG/2ML IJ SOLN
INTRAMUSCULAR | Status: DC | PRN
  Administered 2013-09-19: 1 mg via INTRAVENOUS
  Administered 2013-09-19: .5 mg via INTRAVENOUS

## 2013-09-19 MED ORDER — SODIUM CHLORIDE 0.9 % IV SOLN
500.0000 mg | Freq: Two times a day (BID) | INTRAVENOUS | Status: DC
  Administered 2013-09-20 – 2013-09-21 (×3): 500 mg via INTRAVENOUS
  Filled 2013-09-19 (×4): qty 500

## 2013-09-19 MED ORDER — LIDOCAINE HCL 2 % EX GEL
CUTANEOUS | Status: DC | PRN
  Administered 2013-09-19: 1

## 2013-09-19 MED ORDER — PHENYLEPHRINE HCL 0.25 % NA SOLN
NASAL | Status: DC | PRN
  Administered 2013-09-19: 2 via NASAL

## 2013-09-19 MED ORDER — VANCOMYCIN HCL 500 MG IV SOLR
500.0000 mg | INTRAVENOUS | Status: AC
  Administered 2013-09-19: 500 mg via INTRAVENOUS
  Filled 2013-09-19: qty 500

## 2013-09-19 MED ORDER — FENTANYL CITRATE 0.05 MG/ML IJ SOLN
INTRAMUSCULAR | Status: AC
  Filled 2013-09-19: qty 4

## 2013-09-19 MED ORDER — LIDOCAINE HCL (PF) 1 % IJ SOLN
INTRAMUSCULAR | Status: DC | PRN
  Administered 2013-09-19: 6 mL

## 2013-09-19 MED ORDER — MIDAZOLAM HCL 10 MG/2ML IJ SOLN
INTRAMUSCULAR | Status: AC
  Filled 2013-09-19: qty 4

## 2013-09-19 MED ORDER — FENTANYL CITRATE 0.05 MG/ML IJ SOLN
INTRAMUSCULAR | Status: DC | PRN
  Administered 2013-09-19: 25 ug via INTRAVENOUS

## 2013-09-19 NOTE — Progress Notes (Signed)
[facility] PROGRESS NOTE  Leslie [name] ZOX:[id number] DOB: 1929/07/14 DOA: 09/14/2013 PCP: [name], MD  Assessment/Plan: 1. LLL pneumonia HCAP; although patient does not quite meet the criteria for HCAP we broadened the antibiotic coverage to meet this criteria. Discussed with pulmonology . -DCed azithromycin and ceftriaxone -Continue  Zosyn+ vancomycin -Continue patient on albuterol nebulizers,  Dulera, obtain RR/SpO2 vitals every 4 hours -Continue Mucinex -continue pulmonary toilet -Decrease normal saline to 9ml/hr -Patient's WBC continues to decrease currently= 9.7, down from a high of 17.9 at admission  2. LT parapneumonic effusion; Chest CT with and without contrast rule out recurrence of squamous cell lung cancer vs adenocarcinoma of the breast; see results below -Consulted Plmonology (Dr Kalman Shan) who agrees patient needs a bronchoscopy however feels patient is too weak currently and will defer until patient's stronger.  - Spoke w/ Dr Vassie Loll (pulmonology) secondary to opacifications worsening left lung  fields and worsening air movement. Dr. Vassie Loll has agreed to FOB sometime today. Left Msg on phone to RN Bridget Hartshorn (Daughter who has MPOA)  3. COPD; continue O2 to maintain SPO2> 92% -Continue patient's albuterol nebulizer -Continue Dulera -RR/SpO2 vitals every 4 hours  4. Anxiety; Continue Chlordiazepoxide (home medication)  5. Irritable bowel syndrome; Continue Bentyl (home medication)   6.  Depression;  Continue Remeron + Zoloft (home regimen)  7. Malnutrition; restart Megace 800 mg daily -Per daughter she is eating better   8. Altered mental status; patient remains mildly confused, but pleasant.   9. chronic pain; continue home medication  10. Pulmonary hypertension; Echocardiogram complete see results below  11. Hypokalemia; will replete  Code Status: DNR Family Communication: Discussed plan of care with both daughters and granddaughter.  Bridget Hartshorn has medical power of attorney [phone] Disposition Plan:    Consultants:  Pulmonology (Dr Kalman Shan)   Procedures: CXR 09/19/2013 Advanced changes of COPD/ emphysema noted. There is progressive  worsening aeration to the left lung with possible increase in volume  of left pleural effusion. Right lung appears clear.    CXR 09/15/2013 Interval development of patchy airspace disease throughout the left  lower lobe concerning for pneumonia. - Possible small parapneumonic effusion. - left upper lobectomy with volume loss in the left hemi thorax with right-to-left shift of the cardiac and mediastinal contours.  -Pulmonary hyperexpansion, emphysema and chronic bronchitic changes remain stable.   CT head without contrast 09/15/2013 No acute intracranial abnormality.  Stable atrophy and chronic microvascular ischemic white matter  Changes.  CT chest with contrast 09/15/2013;  1. Consolidative opacity within the remaining left lung.  Additionally there is endobronchial material and occlusion of the  left mainstem bronchus. Considerations for the findings within the  left lung include pneumonia, postobstructive pneumonitis, aspiration  or recurrent malignancy. Considering the endobronchial material  within the left mainstem bronchus, correlation with bronchoscopy  should be considered to exclude endobronchial mass.  2. Small bilateral pleural effusions.  3. Dilation of the ascending thoracic aorta.  4. Enlargement of the main pulmonary artery as can be seen with  pulmonary arterial hypertension.   Echocardiogram;   - Left ventricle: The cavity size was normal. Wall thickness was normal. Systolic function was normal.  -LVEF=  60% to 65%.  -(grade 1 diastolicdysfunction). - Aortic valve: Mild regurgitation. - Mitral valve: There is a mild prolapse of the posterior leaflet of the mitral valve without any associated functional abnormality. - Atrial septum: No  defect or patent foramen ovale was identified. - Pericardium, extracardiac: A trivial pericardial  effusion was identified.    Antibiotics:  Azithromycin 10/10>>>D/Ced 10/10   Ceftriaxone 10/9>>D/Ced 10/10   Zosyn 10/10 >>  Vancomycin 10/10 >>    HPI/Subjective: 79 yo WF PMHx IBS, Osteoporosis, squamous cell lung cancer (s/p left thoracotomy w/ LLLobectomy for squamous cell ca 10/99 by Dr Edwyna Shell... no known recurrence)., adenocarcinoma of the breast (left breast lumpectomy and sentinel node biopsy 12/03 by Dr Ezzard Standing for a 2.2cm infiltrating carcinoma, neg LN's, ER/PR pos, treated w/ XRT, then Tamoxifen for 77yrs), COPD, alcoholism, anxiety, anemia, lives at home with son comes in with worsening confusion today, no fevers. Daughter with her now. No n/v/d. No rashes. No cough. No sob. No complaints of pain. She was just more confused than normal. Fairly independent at home. Has not complaints right now in ED. cxr shows pna. No recent abx. 09/15/2013 states feels mildly SOB while on 2 L 02 via [place]. States does not use O2 at home. Negative CP. 09/16/2013 patient confused today however pleasant. Follows commands but answers questions inappropriately. 09/17/2013 patient still mildly confused however very cooperative, decreased WOB, however still becomes SOB when off her O2. 09/18/2013 patient pleasantly confused, however does recognize family members in the room. TODAY patient continues to be pleasantly confused, currently sitting in bed finishing her breakfast. Still requiring 2 L O2 via nasal cannula while sedentary     Objective: Filed Vitals:   09/18/13 1700 09/18/13 1956 09/18/13 2137 09/19/13 0529  BP: 133/74  142/63 130/67  Pulse: 90  78 72  Temp: 98.3 F (36.8 C)  99.2 F (37.3 C) 97.9 F (36.6 C)  TempSrc: Oral  Oral Oral  Resp: 18  18 18   Height:      Weight:      SpO2: 95% 92% 96% 97%    Intake/Output Summary (Last 24 hours) at 09/19/13 0856 Last data filed at 09/19/13  0205  Gross per 24 hour  Intake    520 ml  Output    850 ml  Net   -330 ml   Filed Weights   09/16/13 1700  Weight: 32.9 kg (72 lb 8.5 oz)    Exam:   General: A./O. x1, continued confusion, follows commands (daughter states patient always confused when hospitalized)  Cardiovascular:, Regular rhythm and rate , negative murmurs rubs or gallops, DP/PT pulse 2+ bilateral   Respiratory: Rt Lung CTA (coarse breath sounds but clearing), Lt Lung decreased movement of air in the Lt lower lung fields compared to yesterday, expiratory wheezing appreciated in the apex of the left lung as well as diffusely in the right lung fields   Abdomen: Soft, nontender, nondistended plus bowel sounds  Musculoskeletal: Very cachectic female, negative pedal edema   Data Reviewed: Basic Metabolic Panel:  Recent Labs Lab 09/14/13 2350 09/15/13 0645 09/16/13 0930 09/16/13 1832 09/17/13 0617 09/18/13 0738 09/19/13 0615  NA 135  --  139  --  143 143 142  K 3.5  --  3.2*  --  3.1* 3.5 3.5  CL 103  --  108  --  114* 113* 112  CO2 23  --  20  --  21 21 26   GLUCOSE 104*  --  150*  --  101* 90 95  BUN 17  --  7  --  4* 3* 3*  CREATININE 0.89 0.74 0.67  --  0.60 0.62 0.64  CALCIUM 8.5  --  7.7*  --  7.5* 8.1* 8.2*  MG  --   --  1.7  --  1.9 1.9 1.8  PHOS  --   --   --  2.3  --   --   --    Liver Function Tests:  Recent Labs Lab 09/14/13 2350 09/16/13 0930 09/17/13 0617 09/18/13 0738 09/19/13 0615  AST 22 16 17 14 15   ALT 8 16 12 11 13   ALKPHOS 58 61 73 69 47  BILITOT 0.5 0.2* 0.3 0.2* 0.2*  PROT 6.0 5.4* 5.0* 5.2* 5.1*  ALBUMIN 2.7* 2.1* 1.9* 2.0* 2.0*   No results found for this basename: LIPASE, AMYLASE,  in the last 168 hours No results found for this basename: AMMONIA,  in the last 168 hours CBC:  Recent Labs Lab 09/14/13 2350 09/15/13 0645 09/16/13 0930 09/17/13 0617 09/18/13 0738 09/19/13 0615  WBC 20.6* 17.9* 14.8* 11.4* 8.8 9.7  NEUTROABS 16.4*  --  11.3* 8.9* 6.3 6.7   HGB 10.7* 9.4* 9.8* 9.3* 9.8* 9.5*  HCT 31.9* 28.0* 29.4* 27.9* 29.9* 29.1*  MCV 88.9 90.0 89.9 90.0 90.3 90.1  PLT 243 238 287 281 295 363   Cardiac Enzymes:  Recent Labs Lab 09/16/13 1832  TROPONINI <0.30   BNP (last 3 results)  Recent Labs  09/16/13 1832  PROBNP 3115.0*   CBG:  Recent Labs Lab 09/14/13 2219  GLUCAP 105*    Recent Results (from the past 240 hour(s))  CULTURE, BLOOD (ROUTINE X 2)     Status: None   Collection Time    09/15/13  6:45 AM      Result Value Range Status   Specimen Description BLOOD LEFT ARM   Final   Special Requests BOTTLES DRAWN AEROBIC ONLY 1CC   Final   Culture  Setup Time     Final   Value: 09/15/2013 10:29     Performed at [facility]   Culture     Final   Value:        BLOOD CULTURE RECEIVED NO GROWTH TO DATE CULTURE WILL BE HELD FOR 5 DAYS BEFORE ISSUING A FINAL NEGATIVE REPORT     Performed at [facility]   Report Status PENDING   Incomplete  CULTURE, BLOOD (ROUTINE X 2)     Status: None   Collection Time    09/15/13  6:55 AM      Result Value Range Status   Specimen Description BLOOD LEFT HAND   Final   Special Requests     Final   Value: BOTTLES DRAWN AEROBIC AND ANAEROBIC 3CC AER 2CC ANA   Culture  Setup Time     Final   Value: 09/15/2013 10:29     Performed at [facility]   Culture     Final   Value:        BLOOD CULTURE RECEIVED NO GROWTH TO DATE CULTURE WILL BE HELD FOR 5 DAYS BEFORE ISSUING A FINAL NEGATIVE REPORT     Performed at [facility]   Report Status PENDING   Incomplete  MRSA PCR SCREENING     Status: None   Collection Time    09/16/13  6:31 PM      Result Value Range Status   MRSA by PCR NEGATIVE  NEGATIVE Final   Comment:            The GeneXpert MRSA Assay (FDA     approved for NASAL specimens     only), is one component of a     comprehensive MRSA colonization     surveillance program. It is  not     intended to diagnose MRSA     infection nor to guide or      monitor treatment for     MRSA infections.     Studies: Dg Chest 2 View  09/19/2013   CLINICAL DATA:  COPD. History of lung cancer.  EXAM: CHEST  2 VIEW  COMPARISON:  09/17/2013  FINDINGS: Advanced changes of COPD/ emphysema noted. There is progressive worsening aeration to the left lung with possible increase in volume of left pleural effusion. Right lung appears clear.  IMPRESSION: 1. Worsening aeration to the left hemi thorax compared with previous exam   Electronically Signed   By: Signa Kell M.D.   On: 09/19/2013 08:15    Scheduled Meds: . albuterol  2.5 mg Nebulization TID  . dextromethorphan-guaiFENesin  1 tablet Oral BID  . dicyclomine  20 mg Oral TID AC  . enoxaparin (LOVENOX) injection  30 mg Subcutaneous Q24H  . gabapentin  300 mg Oral BID  . lactose free nutrition  237 mL Oral BID BM  . megestrol  800 mg Oral Daily  . mirtazapine  15 mg Oral QHS  . mometasone-formoterol  2 puff Inhalation BID  . pantoprazole  20 mg Oral Daily  . piperacillin-tazobactam (ZOSYN)  IV  3.375 g Intravenous Q8H  . sertraline  100 mg Oral Daily  . vancomycin  500 mg Intravenous Q24H   Continuous Infusions: . sodium chloride 150 mL/hr at 09/16/13 2342    Principal Problem:   CAP (community acquired pneumonia) Active Problems:   CARCINOMA, LUNG, SQUAMOUS CELL   ADENOCARCINOMA, BREAST   ANXIETY   ALCOHOLISM   HEARING LOSS   COPD   PSORIASIS   Memory loss   Acute respiratory failure with hypoxia   Altered mental status   Chronic pain syndrome   Parapneumonic effusion   Protein-calorie malnutrition, severe   Hypokalemia    Time spent: 35 minutes   WOODS, CURTIS, J  [facility] Pager [phone]. If 7PM-7AM, please contact night-coverage at [email or web address], password [facility] 09/19/2013, 8:56 AM  LOS: 5 days

## 2013-09-19 NOTE — Progress Notes (Signed)
CSW left bed offers for patient's daughter. CSW called Leslie [name], patient's daughter, and informed her that CSW left bed offers for her. She will review and make a choice.  Georgetta Crafton C. Lashara Urey MSW, LCSW [phone]

## 2013-09-19 NOTE — Telephone Encounter (Signed)
Will forward to SN as an FYI 

## 2013-09-19 NOTE — Progress Notes (Signed)
Video bronchoscopy procedure performed. Washings intervention performed. Brushing intervention performed.

## 2013-09-19 NOTE — Op Note (Signed)
Indication : LUL post obstructive pneumonia in this ex smoker with h/o lung cancer s/p LLLobectomy in 1999.  Written informed consent was obtained prior to the procedure. The risks of the procedure including coughing, bleeding and the small chance of lung puncture requiring chest tube were discussed in great detail. The benefits & alternatives including serial follow up were also discussed.   1.5 mg versed & 25 mcg fentnayl used in divided doses during the procedure Bronchoscope entered from the left nare. Upper airway nml Vocal cords showed nml appearance & motion. Trachea & bronchial tree examined to the subsegmental level. Moderate amount of white secretions were suctioned from lt mainstem bronchus. No endobronchial lesions seen. The LLL stump appeared ok, one clip was visualised. Brushings were obtained from the LUL under fluoroscopy. BAL was also obtained from the LUL.  A CXR will be performed to r/o presence of pneumothorax. She was awake right after the procedure.   Grant Swager V.  [phone]

## 2013-09-19 NOTE — Progress Notes (Addendum)
BRIEF PATIENT DESCRIPTION:  79 yo female with MHx Breast cancer, COPD, squamous cell lung cancer, etoh abuse presented to [facility] 10/8 with altered mental status. Had a fall from toilet to floor earlier that day. Found to have LLL PNA and ?bilateral effusions and was admitted. PCCM asked to consult for possible recurrence or metastasis of lung cancer or breast cancer, respectively.   SIGNIFICANT EVENTS / STUDIES:  10/8 chest xray >>> Left lower lobe pneumonia, ? small associated parapneumonic effusion, s/p LLL lobectomy  10/8 CT head >>> no acute findings  10/9 CT chest w/contrast >>> consolidation in LL, endobronch material and occlusion of main bronchus, small bilateral pleural effusions, dilated ascending aorta, enlarged main pulmonary artery   LINES / TUBES:  PIV x 1   CULTURES:  10/8 UA >>>negative  10/9 Blood Cx >>>  10/9 Urine strep/legionella antibody >>> negative   ANTIBIOTICS:  Ceftriaxone 10/8 >>>  Azithromycin 10/8 >>>    Subjective: Sitting up in chair, nad @rest . Appears frail.  Objective: Vital signs in last 24 hours: Blood pressure 130/67, pulse 72, temperature 97.9 F (36.6 C), temperature source Oral, resp. rate 18, height 4' 11.84" (1.52 m), weight 72 lb 8.5 oz (32.9 kg), SpO2 96.00%.  Intake/Output from previous day: 10/12 0701 - 10/13 0700 In: 520 [P.O.:520] Out: 850 [Urine:850]   Physical Exam:   thin female in nad. Very Hard of hearing Nose without purulence or d/c noted, OP clear, neck without LN or TMG Chest with decreased bs, a few crackles and rhonchi, no wheezing.  CVs- s1s2 nml Abd -soft, non tender Ext - no edema  Lab Results:  Recent Labs  09/17/13 0617 09/18/13 0738 09/19/13 0615  WBC 11.4* 8.8 9.7  HGB 9.3* 9.8* 9.5*  HCT 27.9* 29.9* 29.1*  PLT 281 295 363   BMET  Recent Labs  09/17/13 0617 09/18/13 0738 09/19/13 0615  NA 143 143 142  K 3.1* 3.5 3.5  CL 114* 113* 112  CO2 21 21 26   GLUCOSE 101* 90 95  BUN 4* 3* 3*   CREATININE 0.60 0.62 0.64  CALCIUM 7.5* 8.1* 8.2*    Studies/Results: Dg Chest 2 View  09/19/2013   CLINICAL DATA:  COPD. History of lung cancer.  EXAM: CHEST  2 VIEW  COMPARISON:  09/17/2013  FINDINGS: Advanced changes of COPD/ emphysema noted. There is progressive worsening aeration to the left lung with possible increase in volume of left pleural effusion. Right lung appears clear.  IMPRESSION: 1. Worsening aeration to the left hemi thorax compared with previous exam   Electronically Signed   By: Signa Kell M.D.   On: 09/19/2013 08:15    Assessment/Plan:  1) acute on chronic respiratory failure secondary to underlying copd with what appears to be a LLL pna.  cxr today with some increased density in LLL, but the pt is clinically stable with no fever or increased wob.  S/p LLL obectomy 1999 ([place])  -continue aggressive BD regimen -f/u culture data. -Bronchoscopy today - The risks of procedure including coughing, bleeding and the  chances of lung puncture requiring chest tube were discussed in great detail with pt & her daughter. The benefits & alternatives including serial follow up were also discussed. DNR was reversed for the procedure - short term intubation ok  - Will resume vanc until BAL cx back  Cyril Mourning MD. FCCP. [facility] Pager [phone] If no response call [phone]

## 2013-09-19 NOTE — Progress Notes (Signed)
ANTIBIOTIC CONSULT NOTE - FOLLOW UP  Pharmacy Consult for Vancomycin Indication: possible HCAP  No Known Allergies  Patient Measurements: Height: 4' 11.84" (152 cm) Weight: 72 lb 8.5 oz (32.9 kg) IBW/kg (Calculated) : 45.14  Vital Signs: Temp: 98.2 F (36.8 C) (10/13 1658) Temp src: Oral (10/13 1658) BP: 128/83 mmHg (10/13 1658) Pulse Rate: 94 (10/13 1658) Intake/Output from previous day: 10/12 0701 - 10/13 0700 In: 520 [P.O.:520] Out: 850 [Urine:850] Intake/Output from this shift: Total I/O In: 120 [P.O.:120] Out: 400 [Urine:400]  Labs:  Recent Labs  09/17/13 0617 09/18/13 0738 09/19/13 0615  WBC 11.4* 8.8 9.7  HGB 9.3* 9.8* 9.5*  PLT 281 295 363  CREATININE 0.60 0.62 0.64   Estimated Creatinine Clearance: 28.6 ml/min (by C-G formula based on Cr of 0.64).  Recent Labs  09/19/13 1711  VANCOTROUGH <5.0*     Assessment: 83 yoF on D#5 abx, D#4 Vancomycin for possible HCAP.  CXR today with increased opacity. Vancomycin trough undetectable. Patient wts 32.9 Kg for CG CrCl 29 ml/min.   Goal of Therapy:  Vancomycin trough level 15-20 mcg/ml  Plan:   Pt already received Vancomycin 500 mg IV dose due at 1742.    Give an additional 500 mg IV x 1 then change Vancomycin to 500 mg IV q12h  Repeat VT as needed  Geoffry Paradise, PharmD, BCPS Pager: [phone] 6:47 PM Pharmacy #: [id number]

## 2013-09-19 NOTE — Progress Notes (Signed)
PT Cancellation Note  Patient Details Name: Leslie [name] MRN: [id number] DOB: 06-13-1929   Cancelled Treatment:    Reason Eval/Treat Not Completed: Patient scheduled for procedure this afternoon- daughter requests PT check back another day. Thanks.    Rebeca Alert, MPT Pager: [phone]

## 2013-09-19 NOTE — Progress Notes (Signed)
ANTIBIOTIC CONSULT NOTE - FOLLOW UP  Pharmacy Consult for Vancomycin/Zosyn Indication: treating as HCAP  No Known Allergies  Patient Measurements: Height: 4' 11.84" (152 cm) Weight: 72 lb 8.5 oz (32.9 kg) IBW/kg (Calculated) : 45.14  Vital Signs: Temp: 97.9 F (36.6 C) (10/13 0529) Temp src: Oral (10/13 0529) BP: 130/67 mmHg (10/13 0529) Pulse Rate: 72 (10/13 0529) Intake/Output from previous day: 10/12 0701 - 10/13 0700 In: 520 [P.O.:520] Out: 850 [Urine:850] Intake/Output from this shift: Total I/O In: -  Out: 400 [Urine:400]  Labs:  Recent Labs  09/17/13 0617 09/18/13 0738 09/19/13 0615  WBC 11.4* 8.8 9.7  HGB 9.3* 9.8* 9.5*  PLT 281 295 363  CREATININE 0.60 0.62 0.64   Estimated Creatinine Clearance: 28.6 ml/min (by C-G formula based on Cr of 0.64). No results found for this basename: VANCOTROUGH, Leodis Binet, VANCORANDOM, GENTTROUGH, GENTPEAK, GENTRANDOM, TOBRATROUGH, TOBRAPEAK, TOBRARND, AMIKACINPEAK, AMIKACINTROU, AMIKACIN,  in the last 72 hours   Microbiology: Recent Results (from the past 720 hour(s))  CULTURE, BLOOD (ROUTINE X 2)     Status: None   Collection Time    09/15/13  6:45 AM      Result Value Range Status   Specimen Description BLOOD LEFT ARM   Final   Special Requests BOTTLES DRAWN AEROBIC ONLY 1CC   Final   Culture  Setup Time     Final   Value: 09/15/2013 10:29     Performed at [facility]   Culture     Final   Value:        BLOOD CULTURE RECEIVED NO GROWTH TO DATE CULTURE WILL BE HELD FOR 5 DAYS BEFORE ISSUING A FINAL NEGATIVE REPORT     Performed at [facility]   Report Status PENDING   Incomplete  CULTURE, BLOOD (ROUTINE X 2)     Status: None   Collection Time    09/15/13  6:55 AM      Result Value Range Status   Specimen Description BLOOD LEFT HAND   Final   Special Requests     Final   Value: BOTTLES DRAWN AEROBIC AND ANAEROBIC 3CC AER 2CC ANA   Culture  Setup Time     Final   Value: 09/15/2013 10:29   Performed at [facility]   Culture     Final   Value:        BLOOD CULTURE RECEIVED NO GROWTH TO DATE CULTURE WILL BE HELD FOR 5 DAYS BEFORE ISSUING A FINAL NEGATIVE REPORT     Performed at [facility]   Report Status PENDING   Incomplete  MRSA PCR SCREENING     Status: None   Collection Time    09/16/13  6:31 PM      Result Value Range Status   MRSA by PCR NEGATIVE  NEGATIVE Final   Comment:            The GeneXpert MRSA Assay (FDA     approved for NASAL specimens     only), is one component of a     comprehensive MRSA colonization     surveillance program. It is not     intended to diagnose MRSA     infection nor to guide or     monitor treatment for     MRSA infections.    Anti-infectives   Start     Dose/Rate Route Frequency Ordered Stop   09/16/13 1800  vancomycin (VANCOCIN) 500 mg in sodium chloride 0.9 % 100 mL  IVPB     500 mg 100 mL/hr over 60 Minutes Intravenous Every 24 hours 09/16/13 1740     09/16/13 1800  piperacillin-tazobactam (ZOSYN) IVPB 3.375 g     3.375 g 12.5 mL/hr over 240 Minutes Intravenous Every 8 hours 09/16/13 1740     09/16/13 0200  azithromycin (ZITHROMAX) 500 mg in dextrose 5 % 250 mL IVPB  Status:  Discontinued     500 mg 250 mL/hr over 60 Minutes Intravenous Every 24 hours 09/15/13 0539 09/16/13 1718   09/15/13 2359  cefTRIAXone (ROCEPHIN) 1 g in dextrose 5 % 50 mL IVPB  Status:  Discontinued     1 g 100 mL/hr over 30 Minutes Intravenous Every 24 hours 09/15/13 0539 09/16/13 1718   09/15/13 0115  cefTRIAXone (ROCEPHIN) 1 g in dextrose 5 % 50 mL IVPB     1 g 100 mL/hr over 30 Minutes Intravenous  Once 09/15/13 0106 09/15/13 0142   09/15/13 0115  azithromycin (ZITHROMAX) 500 mg in dextrose 5 % 250 mL IVPB     500 mg 250 mL/hr over 60 Minutes Intravenous  Once 09/15/13 0106 09/15/13 0322     Assessment: 79 yo female with Hx Breast cancer, COPD, SCLC, Etoh abuse presented to [facility] 10/8 with altered mental status. Had a fall from  toilet to floor earlier that day. Found to have LLL PNA and bilateral effusions.Begun on Rocephin/Zithromax and changed Vanc/Zosyn for possible HAP.  Day 5 abx; Day 4 Vancomycin/Zosyn  Afebrile, WBC wnl, but CXray result: L effusion worsening  Trying to r/o recurrence of Cancer, plan FOB when patient able to tolerate  Goal of Therapy:  Vancomycin trough level 15-20 mcg/ml  Plan:   Vancomycin trough today before 4th dose Vancomycin 500mg  q24hr  No change Zosyn 3.375gm q8hr-4 hr infusion  Otho Bellows PharmD Pager [phone] 09/19/2013, 9:38 AM

## 2013-09-20 DIAGNOSIS — F329 Major depressive disorder, single episode, unspecified: Secondary | ICD-10-CM | POA: Diagnosis present

## 2013-09-20 DIAGNOSIS — F32A Depression, unspecified: Secondary | ICD-10-CM | POA: Diagnosis present

## 2013-09-20 LAB — COMPREHENSIVE METABOLIC PANEL
ALT: 10 U/L (ref 0–35)
AST: 10 U/L (ref 0–37)
Alkaline Phosphatase: 46 U/L (ref 39–117)
BUN: 4 mg/dL — ABNORMAL LOW (ref 6–23)
CO2: 26 mEq/L (ref 19–32)
Calcium: 8.6 mg/dL (ref 8.4–10.5)
GFR calc Af Amer: >90 mL/min (ref >90)
GFR calc non Af Amer: 80 mL/min — ABNORMAL LOW (ref >90)
Glucose, Bld: 98 mg/dL (ref 70–99)
Potassium: 3 mEq/L — ABNORMAL LOW (ref 3.5–5.1)
Sodium: 145 mEq/L (ref 135–145)
Total Protein: 5 g/dL — ABNORMAL LOW (ref 6.0–8.3)

## 2013-09-20 LAB — CBC WITH DIFFERENTIAL/PLATELET
Basophils Relative: 0 % (ref 0–1)
Eosinophils Absolute: 0.2 10*3/uL (ref 0.0–0.7)
HCT: 29.5 % — ABNORMAL LOW (ref 36.0–46.0)
Hemoglobin: 9.7 g/dL — ABNORMAL LOW (ref 12.0–15.0)
MCH: 29.7 pg (ref 26.0–34.0)
MCHC: 32.9 g/dL (ref 30.0–36.0)
MCV: 90.2 fL (ref 78.0–100.0)
Monocytes Absolute: 0.9 10*3/uL (ref 0.1–1.0)
Monocytes Relative: 9 % (ref 3–12)
Neutro Abs: 7.1 10*3/uL (ref 1.7–7.7)
RBC: 3.27 MIL/uL — ABNORMAL LOW (ref 3.87–5.11)

## 2013-09-20 LAB — MAGNESIUM: Magnesium: 1.8 mg/dL (ref 1.5–2.5)

## 2013-09-20 MED ORDER — POTASSIUM CHLORIDE 10 MEQ/100ML IV SOLN
10.0000 meq | INTRAVENOUS | Status: AC
  Administered 2013-09-20 (×2): 10 meq via INTRAVENOUS
  Filled 2013-09-20 (×2): qty 100

## 2013-09-20 MED ORDER — POTASSIUM CHLORIDE 20 MEQ/15ML (10%) PO LIQD
40.0000 meq | Freq: Once | ORAL | Status: AC
  Administered 2013-09-20: 40 meq via ORAL
  Filled 2013-09-20: qty 30

## 2013-09-20 MED ORDER — SACCHAROMYCES BOULARDII 250 MG PO CAPS
250.0000 mg | ORAL_CAPSULE | Freq: Two times a day (BID) | ORAL | Status: DC
  Administered 2013-09-20 – 2013-09-26 (×12): 250 mg via ORAL
  Filled 2013-09-20 (×13): qty 1

## 2013-09-20 NOTE — Progress Notes (Signed)
PT Cancellation Note  Patient Details Name: Leslie [name] MRN: [id number] DOB: 12-13-1928   Cancelled Treatment:    Reason Eval/Treat Not Completed: Fatigue/lethargy limiting ability to participate  Attempted to see pt and spent several minutes with her and daughter who is a Engineer, civil (consulting) at [employer] [facility] [employer]. Pt c/o that she has not been able to get much rest and that she did not want to get up this afternoon. Daughter reports that pt had been independent with cane prior to admission and that she eventually will be returning home alone as the son who has been living with her is getting married. Daughter feels that pt will need short term rehab prior to returning home Will complete eval in am.  Bayard Hugger. [place], [place] [phone] 09/20/2013, 4:47 PM

## 2013-09-20 NOTE — Progress Notes (Signed)
[facility] PROGRESS NOTE  SOUNDRA LAMPLEY ZOX:[id number] DOB: Oct 19, 1929 DOA: 09/14/2013 PCP: [name], MD  Assessment/Plan: 1. LLL pneumonia HCAP; although patient does not quite meet the criteria for HCAP we broadened the antibiotic coverage to meet this criteria. Discussed with pulmonology . -DCed azithromycin and ceftriaxone -Continue  Zosyn + vancomycin   -Continue patient on albuterol nebulizers,  Dulera, obtain RR/SpO2 vitals every 4 hours -Continue Mucinex -continue pulmonary toilet -Decrease normal saline to 77ml/hr -Patient's WBC continues to decrease currently= 10.2, down from a high of 17.9 at admission -Counseled patient and family would increase patient's activity in the a.m.; will obtain ambulatory SpO2  2. LT parapneumonic effusion; Chest CT with and without contrast rule out recurrence of squamous cell lung cancer vs adenocarcinoma of the breast; see results below -Consulted Plmonology (Dr Kalman Shan) who agrees patient needs a bronchoscopy however feels patient is too weak currently and will defer until patient's stronger.  - Spoke w/ Dr Vassie Loll (pulmonology) secondary to opacifications worsening left lung  fields and worsening air movement. Dr. Vassie Loll has agreed to FOB sometime today. Left Msg on phone to RN Bridget Hartshorn (Daughter who has MPOA) -Patient S/P cast SOB on 09/19/2013; which has resulted in increased aeration to left upper lobe. Obtain a.m. CXR  3. COPD; continue O2 to maintain SPO2> 92% -Continue patient's albuterol nebulizer -Continue Dulera -RR/SpO2 vitals every 4 hours  4. Anxiety; Continue Chlordiazepoxide (home medication)  5. Irritable bowel syndrome; Continue Bentyl (home medication)   6.  Depression;  Continue Remeron + Zoloft (home regimen)  7. Malnutrition; restart Megace 800 mg daily -Per daughter she is eating better   8. Altered mental status; patient remains mildly confused, but pleasant.   9. chronic pain; continue home  medication  10. Pulmonary hypertension; Echocardiogram complete see results below  11. Hypokalemia; will replete  Code Status: DNR Family Communication: Discussed plan of care with both daughters and granddaughter. Bridget Hartshorn has medical power of attorney [phone] Disposition Plan:    Consultants:  Pulmonology (Dr Kalman Shan)   Procedures: CXR post bronchoscopy 09/19/2013 Official read COPD changes with unchanged appearance of the left lung and slightly  increased opacity at the right base which could represent  atelectasis or infiltrate. My viewing; comparison of post bronchoscopy CXR with pre-bronchoscopy CXR; increased aeration of the LUL post bronchoscopy  BAL from 09/19/2013 AFB; negative to date BAL LUL; negative to date BAL LUL fungal smear/culture; negative to date  CXR 09/19/2013 Advanced changes of COPD/ emphysema noted. There is progressive  worsening aeration to the left lung with possible increase in volume  of left pleural effusion. Right lung appears clear.    CXR 09/15/2013 Interval development of patchy airspace disease throughout the left  lower lobe concerning for pneumonia. - Possible small parapneumonic effusion. - left upper lobectomy with volume loss in the left hemi thorax with right-to-left shift of the cardiac and mediastinal contours.  -Pulmonary hyperexpansion, emphysema and chronic bronchitic changes remain stable.   CT head without contrast 09/15/2013 No acute intracranial abnormality.  Stable atrophy and chronic microvascular ischemic white matter  Changes.  CT chest with contrast 09/15/2013;  1. Consolidative opacity within the remaining left lung.  Additionally there is endobronchial material and occlusion of the  left mainstem bronchus. Considerations for the findings within the  left lung include pneumonia, postobstructive pneumonitis, aspiration  or recurrent malignancy. Considering the endobronchial material  within the  left mainstem bronchus, correlation with bronchoscopy  should be considered to exclude endobronchial  mass.  2. Small bilateral pleural effusions.  3. Dilation of the ascending thoracic aorta.  4. Enlargement of the main pulmonary artery as can be seen with  pulmonary arterial hypertension.   Echocardiogram;   - Left ventricle: The cavity size was normal. Wall thickness was normal. Systolic function was normal.  -LVEF=  60% to 65%.  -(grade 1 diastolicdysfunction). - Aortic valve: Mild regurgitation. - Mitral valve: There is a mild prolapse of the posterior leaflet of the mitral valve without any associated functional abnormality. - Atrial septum: No defect or patent foramen ovale was identified. - Pericardium, extracardiac: A trivial pericardial effusion was identified.    Antibiotics:  Azithromycin 10/10>>>D/Ced 10/10   Ceftriaxone 10/9>>D/Ced 10/10   Zosyn 10/10 >>  Vancomycin 10/10 >>    HPI/Subjective: 79 yo WF PMHx IBS, Osteoporosis, squamous cell lung cancer (s/p left thoracotomy w/ LLLobectomy for squamous cell ca 10/99 by Dr Edwyna Shell... no known recurrence)., adenocarcinoma of the breast (left breast lumpectomy and sentinel node biopsy 12/03 by Dr Ezzard Standing for a 2.2cm infiltrating carcinoma, neg LN's, ER/PR pos, treated w/ XRT, then Tamoxifen for 35yrs), COPD, alcoholism, anxiety, anemia, lives at home with son comes in with worsening confusion today, no fevers. Daughter with her now. No n/v/d. No rashes. No cough. No sob. No complaints of pain. She was just more confused than normal. Fairly independent at home. Has not complaints right now in ED. cxr shows pna. No recent abx. 09/15/2013 states feels mildly SOB while on 2 L 02 via [place]. States does not use O2 at home. Negative CP. 09/16/2013 patient confused today however pleasant. Follows commands but answers questions inappropriately. 09/17/2013 patient still mildly confused however very cooperative, decreased WOB, however still  becomes SOB when off her O2. 09/18/2013 patient pleasantly confused, however does recognize family members in the room. 09/19/2013 patient continues to be pleasantly confused, currently sitting in bed finishing her breakfast. Still requiring 2 L O2 via nasal cannula while sedentary. TODAY patient much better spirits, joking with family and medical staff. The WOB decreased. Per family patient ate approximately half of her lunch (coleslaw ice cream and a bite or 2 of barbecue).      Objective: Filed Vitals:   09/20/13 0522 09/20/13 0824 09/20/13 1130 09/20/13 1500  BP: 152/66   135/73  Pulse: 72  91 93  Temp: 98.4 F (36.9 C)   98.1 F (36.7 C)  TempSrc: Oral   Oral  Resp: 18   16  Height:      Weight:      SpO2: 96% 96% 88% 94%    Intake/Output Summary (Last 24 hours) at 09/20/13 2006 Last data filed at 09/20/13 1624  Gross per 24 hour  Intake    680 ml  Output    450 ml  Net    230 ml   Filed Weights   09/16/13 1700  Weight: 32.9 kg (72 lb 8.5 oz)    Exam:   General: A./O. x1, continued confusion, follows commands (daughter states patient always confused when hospitalized)  Cardiovascular:, Regular rhythm and rate , negative murmurs rubs or gallops, DP/PT pulse 2+ bilateral   Respiratory: Rt Lung CTA (mild expiratory wheezing ), Lt Lung increased movement of air in the Lt lower lung fields compared to yesterday, increased air movement in the apex of the left lung Abdomen: Soft, nontender, nondistended plus bowel sounds  Musculoskeletal: Very cachectic female, negative pedal edema   Data Reviewed: Basic Metabolic Panel:  Recent Labs Lab 09/16/13 0930  09/16/13 1832 09/17/13 0617 09/18/13 0738 09/19/13 0615 09/20/13 0530  NA 139  --  143 143 142 145  K 3.2*  --  3.1* 3.5 3.5 3.0*  CL 108  --  114* 113* 112 112  CO2 20  --  21 21 26 26   GLUCOSE 150*  --  101* 90 95 98  BUN 7  --  4* 3* 3* 4*  CREATININE 0.67  --  0.60 0.62 0.64 0.68  CALCIUM 7.7*  --  7.5*  8.1* 8.2* 8.6  MG 1.7  --  1.9 1.9 1.8 1.8  PHOS  --  2.3  --   --   --   --    Liver Function Tests:  Recent Labs Lab 09/16/13 0930 09/17/13 0617 09/18/13 0738 09/19/13 0615 09/20/13 0530  AST 16 17 14 15 10   ALT 16 12 11 13 10   ALKPHOS 61 73 69 47 46  BILITOT 0.2* 0.3 0.2* 0.2* 0.2*  PROT 5.4* 5.0* 5.2* 5.1* 5.0*  ALBUMIN 2.1* 1.9* 2.0* 2.0* 2.0*   No results found for this basename: LIPASE, AMYLASE,  in the last 168 hours No results found for this basename: AMMONIA,  in the last 168 hours CBC:  Recent Labs Lab 09/16/13 0930 09/17/13 0617 09/18/13 0738 09/19/13 0615 09/20/13 0530  WBC 14.8* 11.4* 8.8 9.7 10.2  NEUTROABS 11.3* 8.9* 6.3 6.7 7.1  HGB 9.8* 9.3* 9.8* 9.5* 9.7*  HCT 29.4* 27.9* 29.9* 29.1* 29.5*  MCV 89.9 90.0 90.3 90.1 90.2  PLT 287 281 295 363 425*   Cardiac Enzymes:  Recent Labs Lab 09/16/13 1832  TROPONINI <0.30   BNP (last 3 results)  Recent Labs  09/16/13 1832  PROBNP 3115.0*   CBG:  Recent Labs Lab 09/14/13 2219  GLUCAP 105*    Recent Results (from the past 240 hour(s))  CULTURE, BLOOD (ROUTINE X 2)     Status: None   Collection Time    09/15/13  6:45 AM      Result Value Range Status   Specimen Description BLOOD LEFT ARM   Final   Special Requests BOTTLES DRAWN AEROBIC ONLY 1CC   Final   Culture  Setup Time     Final   Value: 09/15/2013 10:29     Performed at [facility]   Culture     Final   Value:        BLOOD CULTURE RECEIVED NO GROWTH TO DATE CULTURE WILL BE HELD FOR 5 DAYS BEFORE ISSUING A FINAL NEGATIVE REPORT     Performed at [facility]   Report Status PENDING   Incomplete  CULTURE, BLOOD (ROUTINE X 2)     Status: None   Collection Time    09/15/13  6:55 AM      Result Value Range Status   Specimen Description BLOOD LEFT HAND   Final   Special Requests     Final   Value: BOTTLES DRAWN AEROBIC AND ANAEROBIC 3CC AER 2CC ANA   Culture  Setup Time     Final   Value: 09/15/2013 10:29      Performed at [facility]   Culture     Final   Value:        BLOOD CULTURE RECEIVED NO GROWTH TO DATE CULTURE WILL BE HELD FOR 5 DAYS BEFORE ISSUING A FINAL NEGATIVE REPORT     Performed at [facility]   Report Status PENDING   Incomplete  MRSA PCR SCREENING  Status: None   Collection Time    09/16/13  6:31 PM      Result Value Range Status   MRSA by PCR NEGATIVE  NEGATIVE Final   Comment:            The GeneXpert MRSA Assay (FDA     approved for NASAL specimens     only), is one component of a     comprehensive MRSA colonization     surveillance program. It is not     intended to diagnose MRSA     infection nor to guide or     monitor treatment for     MRSA infections.  AFB CULTURE WITH SMEAR     Status: None   Collection Time    09/19/13  3:04 PM      Result Value Range Status   Specimen Description BRONCHIAL ALVEOLAR LAVAGE LUL   Final   Special Requests NONE   Final   ACID FAST SMEAR     Final   Value: NO ACID FAST BACILLI SEEN     Performed at [facility]   Culture     Final   Value: CULTURE WILL BE EXAMINED FOR 6 WEEKS BEFORE ISSUING A FINAL REPORT     Performed at [facility]   Report Status PENDING   Incomplete  CULTURE, BAL-QUANTITATIVE     Status: None   Collection Time    09/19/13  3:04 PM      Result Value Range Status   Specimen Description BRONCHIAL ALVEOLAR LAVAGE LUL   Final   Special Requests NONE   Final   Gram Stain     Final   Value: MODERATE WBC PRESENT, PREDOMINANTLY PMN     NO SQUAMOUS EPITHELIAL CELLS SEEN     NO ORGANISMS SEEN     Performed at [facility] Count PENDING   Incomplete   Culture     Final   Value: NO GROWTH 1 DAY     Performed at [facility]   Report Status PENDING   Incomplete  FUNGUS CULTURE W SMEAR     Status: None   Collection Time    09/19/13  3:04 PM      Result Value Range Status   Specimen Description BRONCHIAL ALVEOLAR LAVAGE LUL   Final   Special  Requests NONE   Final   Fungal Smear     Final   Value: NO YEAST OR FUNGAL ELEMENTS SEEN     Performed at [facility]   Culture     Final   Value: CULTURE IN PROGRESS FOR FOUR WEEKS     Performed at [facility]   Report Status PENDING   Incomplete     Studies: Dg Chest 2 View  09/19/2013   CLINICAL DATA:  COPD. History of lung cancer.  EXAM: CHEST  2 VIEW  COMPARISON:  09/17/2013  FINDINGS: Advanced changes of COPD/ emphysema noted. There is progressive worsening aeration to the left lung with possible increase in volume of left pleural effusion. Right lung appears clear.  IMPRESSION: 1. Worsening aeration to the left hemi thorax compared with previous exam   Electronically Signed   By: Signa Kell M.D.   On: 09/19/2013 08:15   Dg Chest Port 1 View  09/19/2013   CLINICAL DATA:  Post bronchoscopic brushings of the left upper lobe  EXAM: PORTABLE CHEST - 1 VIEW  COMPARISON:  Portable exam 1513 hr  compared to earlier exam of 09/19/2013 at 0739 hr  FINDINGS: Volume loss in left hemi thorax with mediastinal shift to the left.  Persistent opacification of the mid to inferior left hemothorax at left apex with infiltrate throughout left upper lobe as well.  Hyperexpanded right lung with underlying COPD.  Minimal atelectasis or infiltrate at right base, new.  No gross pneumothorax or new areas of consolidation identified.  Bones demineralized with pole posttraumatic deformity of the proximal right humerus.  IMPRESSION: COPD changes with unchanged appearance of the left lung and slightly increased opacity at the right base which could represent atelectasis or infiltrate.   Electronically Signed   By: Ulyses Southward M.D.   On: 09/19/2013 15:25   Dg C-arm Bronchoscopy  09/19/2013   CLINICAL DATA: abnormal ct   C-ARM BRONCHOSCOPY  Fluoroscopy was utilized by the requesting physician.  No radiographic  interpretation.     Scheduled Meds: . albuterol  2.5 mg Nebulization TID  .  dextromethorphan-guaiFENesin  1 tablet Oral BID  . dicyclomine  20 mg Oral TID AC  . enoxaparin (LOVENOX) injection  30 mg Subcutaneous Q24H  . gabapentin  300 mg Oral BID  . lactose free nutrition  237 mL Oral BID BM  . megestrol  800 mg Oral Daily  . mirtazapine  15 mg Oral QHS  . mometasone-formoterol  2 puff Inhalation BID  . pantoprazole  20 mg Oral Daily  . piperacillin-tazobactam (ZOSYN)  IV  3.375 g Intravenous Q8H  . saccharomyces boulardii  250 mg Oral BID  . sertraline  100 mg Oral Daily  . vancomycin  500 mg Intravenous Q12H   Continuous Infusions: . sodium chloride 50 mL/hr at 09/20/13 1250    Principal Problem:   CAP (community acquired pneumonia) Active Problems:   CARCINOMA, LUNG, SQUAMOUS CELL   ADENOCARCINOMA, BREAST   ANXIETY   ALCOHOLISM   HEARING LOSS   COPD   PSORIASIS   Memory loss   Acute respiratory failure with hypoxia   Altered mental status   Chronic pain syndrome   Parapneumonic effusion   Protein-calorie malnutrition, severe   Hypokalemia    Time spent: 35 minutes   WOODS, CURTIS, J  [facility] Pager [phone]. If 7PM-7AM, please contact night-coverage at [email or web address], password [facility] 09/20/2013, 8:06 PM  LOS: 6 days

## 2013-09-20 NOTE — Progress Notes (Signed)
BRIEF PATIENT DESCRIPTION:  79 yo female with MHx Breast cancer, COPD, squamous cell lung cancer, etoh abuse presented to [facility] 10/8 with altered mental status. Had a fall from toilet to floor earlier that day. Found to have LLL PNA and ?bilateral effusions and was admitted. PCCM asked to consult for possible recurrence or metastasis of lung cancer or breast cancer, respectively.   SIGNIFICANT EVENTS / STUDIES:  10/8 chest xray >>> Left lower lobe pneumonia, ? small associated parapneumonic effusion, s/p LLL lobectomy  10/8 CT head >>> no acute findings  10/9 CT chest w/contrast >>> consolidation in LL, endobronch material and occlusion of main bronchus, small bilateral pleural effusions, dilated ascending aorta, enlarged main pulmonary artery  10-13 FOB with brushings and bal. LINES / TUBES:  PIV x 1   CULTURES:  10/8 UA >>>negative  10/9 Blood Cx >>> ng 10/9 Urine strep/legionella antibody >>> negative  10-13 bal>> 10-13 brushings>> ANTIBIOTICS:  Ceftriaxone 10/8 >>> off Azithromycin 10/8 >>>off 10-13 vanc>> 10-10 zoysn>>   Subjective: Sitting up in bed, nad @rest . Appears frail. No CP, dyspnea  Objective: Vital signs in last 24 hours: Blood pressure 152/66, pulse 72, temperature 98.4 F (36.9 C), temperature source Oral, resp. rate 18, height 4' 11.84" (1.52 m), weight 72 lb 8.5 oz (32.9 kg), SpO2 96.00%.  Intake/Output from previous day: 10/13 0701 - 10/14 0700 In: 120 [P.O.:120] Out: 850 [Urine:850]   Physical Exam:   thin female in nad. Very Hard of hearing Nose without purulence or d/c noted, OP clear, neck without LN or TMG Chest with decreased bs, a few crackles and rhonchi, no wheezing.  CVs- s1s2 nml Abd -soft, non tender Ext - no edema  Lab Results:  Recent Labs  09/18/13 0738 09/19/13 0615 09/20/13 0530  WBC 8.8 9.7 10.2  HGB 9.8* 9.5* 9.7*  HCT 29.9* 29.1* 29.5*  PLT 295 363 425*   BMET  Recent Labs  09/18/13 0738 09/19/13 0615  09/20/13 0530  NA 143 142 145  K 3.5 3.5 3.0*  CL 113* 112 112  CO2 21 26 26   GLUCOSE 90 95 98  BUN 3* 3* 4*  CREATININE 0.62 0.64 0.68  CALCIUM 8.1* 8.2* 8.6    Studies/Results: Dg Chest 2 View  09/19/2013   CLINICAL DATA:  COPD. History of lung cancer.  EXAM: CHEST  2 VIEW  COMPARISON:  09/17/2013  FINDINGS: Advanced changes of COPD/ emphysema noted. There is progressive worsening aeration to the left lung with possible increase in volume of left pleural effusion. Right lung appears clear.  IMPRESSION: 1. Worsening aeration to the left hemi thorax compared with previous exam   Electronically Signed   By: Signa Kell M.D.   On: 09/19/2013 08:15   Dg Chest Port 1 View  09/19/2013   CLINICAL DATA:  Post bronchoscopic brushings of the left upper lobe  EXAM: PORTABLE CHEST - 1 VIEW  COMPARISON:  Portable exam 1513 hr compared to earlier exam of 09/19/2013 at 0739 hr  FINDINGS: Volume loss in left hemi thorax with mediastinal shift to the left.  Persistent opacification of the mid to inferior left hemothorax at left apex with infiltrate throughout left upper lobe as well.  Hyperexpanded right lung with underlying COPD.  Minimal atelectasis or infiltrate at right base, new.  No gross pneumothorax or new areas of consolidation identified.  Bones demineralized with pole posttraumatic deformity of the proximal right humerus.  IMPRESSION: COPD changes with unchanged appearance of the left lung and slightly increased opacity  at the right base which could represent atelectasis or infiltrate.   Electronically Signed   By: Ulyses Southward M.D.   On: 09/19/2013 15:25   Dg C-arm Bronchoscopy  09/19/2013   CLINICAL DATA: abnormal ct   C-ARM BRONCHOSCOPY  Fluoroscopy was utilized by the requesting physician.  No radiographic  interpretation.     Assessment/Plan:  1) acute on chronic respiratory failure secondary to underlying copd with what appears to be a LLL pna.  cxr today with some increased density in  LLL, but the pt is clinically stable with no fever or increased wob.  S/p LLL obectomy 1999 ([facility]) No endobronchial lesions on bronchoscopy  -continue aggressive BD regimen -f/u culture data. -Bronchoscopy 10-13 without problems.  - Can dc vanc until BAL cx remains neg in 24h  I have informed Dr [name], discussed extensively with pts daughter  Cyril Mourning MD. FCCP. [facility] Pager [phone] If no response call [phone]    09/20/2013, 8:44 AM

## 2013-09-20 NOTE — Evaluation (Signed)
Occupational Therapy Evaluation Patient Details Name: Leslie [name] MRN: [id number] DOB: 12-10-1928 Today's Date: 09/20/2013 Time: 1120-1140 OT Time Calculation (min): 20 min  OT Assessment / Plan / Recommendation History of present illness 79 yo female lives at home with son comes in with worsening confusion today, no fevers.  Dtr with her now.  No n/v/d.  No rashes.  No cough.  No sob.  No complaints of pain.  She was just more confused than normal.  Fairly independent at home.  Has not complaints right now in ED.  cxr shows pna.  No recent abx.   Clinical Impression   Pt is limited by fatigue and currently only tolerated up to [facility] and then back to bed. Will benefit from skilled OT services to maximize ADL independence and strength for next venue of care.     OT Assessment  Patient needs continued OT Services    Follow Up Recommendations  SNF;Supervision/Assistance - 24 hour    Barriers to Discharge      Equipment Recommendations  3 in 1 bedside comode    Recommendations for Other Services    Frequency  Min 2X/week    Precautions / Restrictions Precautions Precautions: Fall Precaution Comments: monitor sats Restrictions Weight Bearing Restrictions: No   Pertinent Vitals/Pain 94% supine 2L HR 92 93% sit EOB 2L HR 92 On BSC 88% after initial transfer on 2L but up to low 90s with rest.    ADL  Eating/Feeding: Simulated;Independent Where Assessed - Eating/Feeding: Bed level Grooming: Simulated;Wash/dry hands;Set up Where Assessed - Grooming: Supine, head of bed up Upper Body Bathing: Simulated;Chest;Right arm;Left arm;Abdomen;Minimal assistance Where Assessed - Upper Body Bathing: Unsupported sitting Lower Body Bathing: Simulated;Moderate assistance Where Assessed - Lower Body Bathing: Supported sit to stand Upper Body Dressing: Simulated;Minimal assistance Where Assessed - Upper Body Dressing: Unsupported sitting Lower Body Dressing: Simulated;Maximal  assistance Where Assessed - Lower Body Dressing: Supported sit to stand Toilet Transfer: Performed;Minimal assistance Toilet Transfer Method: Surveyor, minerals: Materials engineer and Hygiene: Performed;Min guard Where Assessed - Engineer, mining and Hygiene: Sit on 3-in-1 or toilet ADL Comments: Pt fatigues rapidly. Sat EOB about 3 minutes and then transferred to [facility] and back to bed. Pt attempted to don sock herself but unable to sustain reaching forward to don sock. Sats 88% on 2L with transfer to [facility] but up to low 90s with rest. Pt is very HOH.    OT Diagnosis: Generalized weakness  OT Problem List: Decreased strength;Decreased activity tolerance;Decreased knowledge of use of DME or AE OT Treatment Interventions: Self-care/ADL training;DME and/or AE instruction;Therapeutic activities;Patient/family education   OT Goals(Current goals can be found in the care plan section) Acute Rehab OT Goals Patient Stated Goal: agreeable to get up, none stated OT Goal Formulation: With patient Time For Goal Achievement: 10/04/13 Potential to Achieve Goals: Good  Visit Information  Last OT Received On: 09/20/13 Assistance Needed: +1 History of Present Illness: 79 yo female lives at home with son comes in with worsening confusion today, no fevers.  Dtr with her now.  No n/v/d.  No rashes.  No cough.  No sob.  No complaints of pain.  She was just more confused than normal.  Fairly independent at home.  Has not complaints right now in ED.  cxr shows pna.  No recent abx.       Prior Functioning     Home Living Family/patient expects to be discharged to:: Skilled nursing facility Living Arrangements:  Children Prior Function Level of Independence: Needs assistance Gait / Transfers Assistance Needed: pt states she didnt use an assistive device PTA ADL's / Homemaking Assistance Needed: daughter helps with meals, household tasks. Pt  states she did own bath, dress, and basic ADL Communication Communication: HOH         Vision/Perception Vision - History Baseline Vision: Wears glasses all the time   Cognition  Cognition Arousal/Alertness: Awake/alert Behavior During Therapy: WFL for tasks assessed/performed Overall Cognitive Status: Within Functional Limits for tasks assessed    Extremity/Trunk Assessment Upper Extremity Assessment Upper Extremity Assessment: Generalized weakness     Mobility Bed Mobility Bed Mobility: Supine to Sit;Sit to Supine Supine to Sit: 4: Min assist;HOB elevated;With rails Sit to Supine: 4: Min assist;HOB elevated;With rail Transfers Transfers: Sit to Stand;Stand to Sit Sit to Stand: 4: Min assist;With upper extremity assist;From bed;From chair/3-in-1 Stand to Sit: 4: Min assist;With upper extremity assist;To bed;To chair/3-in-1 Details for Transfer Assistance: assist to rise and stabilize and control descent to BSC/bed.     Exercise     Balance Balance Balance Assessed: Yes Static Standing Balance Static Standing - Level of Assistance: 4: Min assist   End of Session OT - End of Session Activity Tolerance: Patient limited by fatigue Patient left: in bed;with call bell/phone within reach  GO     [name] [phone] 09/20/2013, 11:52 AM

## 2013-09-21 ENCOUNTER — Encounter (HOSPITAL_COMMUNITY): Payer: Self-pay | Admitting: Pulmonary Disease

## 2013-09-21 ENCOUNTER — Inpatient Hospital Stay (HOSPITAL_COMMUNITY): Payer: Medicare Other

## 2013-09-21 DIAGNOSIS — F329 Major depressive disorder, single episode, unspecified: Secondary | ICD-10-CM

## 2013-09-21 DIAGNOSIS — E43 Unspecified severe protein-calorie malnutrition: Secondary | ICD-10-CM

## 2013-09-21 LAB — CULTURE, BLOOD (ROUTINE X 2): Culture: NO GROWTH

## 2013-09-21 LAB — COMPREHENSIVE METABOLIC PANEL
AST: 13 U/L (ref 0–37)
Alkaline Phosphatase: 42 U/L (ref 39–117)
BUN: 3 mg/dL — ABNORMAL LOW (ref 6–23)
CO2: 29 mEq/L (ref 19–32)
GFR calc Af Amer: >90 mL/min (ref >90)
Glucose, Bld: 85 mg/dL (ref 70–99)
Potassium: 3.6 mEq/L (ref 3.5–5.1)
Sodium: 143 mEq/L (ref 135–145)
Total Protein: 4.9 g/dL — ABNORMAL LOW (ref 6.0–8.3)

## 2013-09-21 LAB — CBC WITH DIFFERENTIAL/PLATELET
Basophils Absolute: 0 10*3/uL (ref 0.0–0.1)
Eosinophils Relative: 2 % (ref 0–5)
HCT: 28.8 % — ABNORMAL LOW (ref 36.0–46.0)
Lymphocytes Relative: 21 % (ref 12–46)
Lymphs Abs: 2 10*3/uL (ref 0.7–4.0)
Monocytes Absolute: 0.9 10*3/uL (ref 0.1–1.0)
Monocytes Relative: 10 % (ref 3–12)
Neutro Abs: 6.6 10*3/uL (ref 1.7–7.7)
Neutrophils Relative %: 68 % (ref 43–77)
Platelets: 401 10*3/uL — ABNORMAL HIGH (ref 150–400)
RDW: 16.8 % — ABNORMAL HIGH (ref 11.5–15.5)
WBC: 9.7 10*3/uL (ref 4.0–10.5)

## 2013-09-21 LAB — LACTATE DEHYDROGENASE: LDH: 296 U/L — ABNORMAL HIGH (ref 94–250)

## 2013-09-21 NOTE — Progress Notes (Signed)
Occupational Therapy Treatment Patient Details Name: Leslie [name] MRN: [id number] DOB: 1929/05/15 Today's Date: 09/21/2013 Time: 7829-5621 OT Time Calculation (min): 18 min  OT Assessment / Plan / Recommendation  History of present illness pt was admitted with worsening confusion.  has pna.    OT comments  Pt limited by back pain but willing to participate with OT.    Follow Up Recommendations  SNF;Supervision/Assistance - 24 hour    Barriers to Discharge       Equipment Recommendations  3 in 1 bedside comode    Recommendations for Other Services    Frequency Min 2X/week   Progress towards OT Goals Progress towards OT goals: Progressing toward goals  Plan      Precautions / Restrictions Precautions Precautions: Fall Restrictions Weight Bearing Restrictions: No   Pertinent Vitals/Pain Back "hurts bad". Repositioned in bed.  Checked with RN--pt was premedicated.     ADL  Grooming: Wash/dry face;Brushing hair Where Assessed - Grooming: Unsupported sitting Transfers/Ambulation Related to ADLs: bed mobility with min A, extra time.  Back started to hurt after sitting eob for about 3 minutes.  Returned to supine. Pt states lying flat is best position for her when her back hurts ADL Comments: tolerated sitting eob but only performed 2 grooming tasks as pt did not want to do any more.  She did not have teeth at this time.      OT Diagnosis:    OT Problem List:   OT Treatment Interventions:     OT Goals(current goals can now be found in the care plan section)    Visit Information  Last OT Received On: 09/21/13 Assistance Needed: +1 History of Present Illness: pt was admitted with worsening confusion.  has pna.     Subjective Data      Prior Functioning       Cognition  Cognition Behavior During Therapy: WFL for tasks assessed/performed Overall Cognitive Status: Within Functional Limits for tasks assessed    Mobility  Bed Mobility Supine to Sit: 4: Min  assist;HOB elevated (about 20 degrees; extra time)    Exercises      Balance     End of Session OT - End of Session Activity Tolerance: Patient limited by pain Patient left: in bed;with call bell/phone within reach;with bed alarm set  GO     Jillyn Stacey 09/21/2013, 11:46 AM Marica Otter, OTR/L [phone] 09/21/2013

## 2013-09-21 NOTE — Progress Notes (Signed)
BRIEF PATIENT DESCRIPTION:  79 yo female with MHx Breast cancer, COPD, squamous cell lung cancer, etoh abuse presented to [facility] 10/8 with altered mental status. Had a fall from toilet to floor earlier that day. Found to have LLL PNA and ?bilateral effusions and was admitted. PCCM asked to consult for possible recurrence or metastasis of lung cancer or breast cancer, respectively.   SIGNIFICANT EVENTS / STUDIES:  10/8 chest xray >>> Left lower lobe pneumonia, ? small associated parapneumonic effusion, s/p LLL lobectomy  10/8 CT head >>> no acute findings  10/9 CT chest w/contrast >>> consolidation in LL, endobronch material and occlusion of main bronchus, small bilateral pleural effusions, dilated ascending aorta, enlarged main pulmonary artery  10-13 FOB with brushings and bal. LINES / TUBES:  PIV x 1   CULTURES:  10/8 UA >>>negative  10/9 Blood Cx >>> ng 10/9 Urine strep/legionella antibody >>> negative  10-13 bal>>nos>> 10-13 brushings>>no malignant cells  ANTIBIOTICS:  Ceftriaxone 10/8 >>> off Azithromycin 10/8 >>>off 10-13 vanc>>10/15 10-10 zoysn>>   Subjective: Sitting up in bed, nad @rest . Appears frail. No CP, dyspnea, very HOH  Objective: Vital signs in last 24 hours: Blood pressure 158/72, pulse 74, temperature 98.7 F (37.1 C), temperature source Axillary, resp. rate 18, height 4' 11.84" (1.52 m), weight 72 lb 8.5 oz (32.9 kg), SpO2 92.00%.  Intake/Output from previous day: 10/14 0701 - 10/15 0700 In: 680 [P.O.:680] Out: -    Physical Exam:   thin female in nad. Very Hard of hearing Nose without purulence or d/c noted, OP clear, neck without LN or TMG Chest with decreased bs, a few crackles and rhonchi, no wheezing.  CVs- s1s2 nml Abd -soft, non tender Ext - no edema  Lab Results:  Recent Labs  09/19/13 0615 09/20/13 0530 09/21/13 0645  WBC 9.7 10.2 9.7  HGB 9.5* 9.7* 9.3*  HCT 29.1* 29.5* 28.8*  PLT 363 425* 401*   BMET  Recent Labs   09/19/13 0615 09/20/13 0530 09/21/13 0645  NA 142 145 143  K 3.5 3.0* 3.6  CL 112 112 107  CO2 26 26 29   GLUCOSE 95 98 85  BUN 3* 4* 3*  CREATININE 0.64 0.68 0.62  CALCIUM 8.2* 8.6 8.7    Studies/Results: Dg Chest 2 View  09/21/2013   CLINICAL DATA:  Cough. Left lower lobe pneumonia.  EXAM: CHEST  2 VIEW  COMPARISON:  CHEST x-ray 09/19/2013.  FINDINGS: Significant volume loss in the left hemithorax is evident with marked right-to-left shift of cardiomediastinal structures, similar to the prior study. This is in part related to prior left lower lobectomy, although there is undoubtedly significant atelectasis and consolidation in the remaining left upper lobe. Right lung appears relatively clear, although there is some patchy mild diffuse bronchial wall thickening, most pronounced in the right base. Moderate to large left pleural effusion is unchanged. No evidence of pulmonary edema. Heart size is likely normal, but cardiac silhouette is largely obscured. Atherosclerosis in the thoracic aorta.  IMPRESSION: 1. Overall, the radiographic appearance the chest is very similar to prior examination from 2 days ago as discussed above, with exception of slight increase in the large left pleural effusion.   Electronically Signed   By: Trudie Reed M.D.   On: 09/21/2013 09:29   Dg Chest Port 1 View  09/19/2013   CLINICAL DATA:  Post bronchoscopic brushings of the left upper lobe  EXAM: PORTABLE CHEST - 1 VIEW  COMPARISON:  Portable exam 1513 hr compared to earlier exam  of 09/19/2013 at 0739 hr  FINDINGS: Volume loss in left hemi thorax with mediastinal shift to the left.  Persistent opacification of the mid to inferior left hemothorax at left apex with infiltrate throughout left upper lobe as well.  Hyperexpanded right lung with underlying COPD.  Minimal atelectasis or infiltrate at right base, new.  No gross pneumothorax or new areas of consolidation identified.  Bones demineralized with pole  posttraumatic deformity of the proximal right humerus.  IMPRESSION: COPD changes with unchanged appearance of the left lung and slightly increased opacity at the right base which could represent atelectasis or infiltrate.   Electronically Signed   By: Ulyses Southward M.D.   On: 09/19/2013 15:25   Dg C-arm Bronchoscopy  09/19/2013   CLINICAL DATA: abnormal ct   C-ARM BRONCHOSCOPY  Fluoroscopy was utilized by the requesting physician.  No radiographic  interpretation.     Assessment/Plan:  1) acute on chronic respiratory failure /copd/ LLL pna.   cxr today with ? LLL pna + effusion  clinically stable with no fever or increased wob.  S/p LLL obectomy 1999 ([place]) No endobronchial lesions on bronchoscopy  -continue aggressive BD regimen -f/u culture data. - Can dc vanc since BAL cx remains neg .  -Informed Dr [name], discussed extensively with pts daughter, risks & benefits of thoracentesis discussed - will asK IR to tap under US guidance Plan for SNF on discharge DNR was reversed for bscopy, should be reinstated   Cyril Mourning MD. Tonny Bollman. [facility] Pager [phone] If no response call [phone]   09/21/2013, 10:01 AM

## 2013-09-21 NOTE — Evaluation (Signed)
Physical Therapy Evaluation Patient Details Name: Leslie [name] MRN: [id number] DOB: [birth year] Today's Date: 09/21/2013 Time: 8295-6213 PT Time Calculation (min): 15 min  PT Assessment / Plan / Recommendation History of Present Illness  pt was admitted with worsening confusion.  has pna. Pt had bronchoscopy 10/13 and is planned for possible thoracocentesis 10/16  Clinical Impression  Pt is deconditioned,but is able to move well in the bed and transfer to bedside commode.  Her activity tolerance is limited. Daughter is debating re SNF or if she will be able to put together 24 hour care initially for pt to be at home.     PT Assessment  Patient needs continued PT services    Follow Up Recommendations  Home health PT;Supervision/Assistance - 24 hour (24 assist initially vs SNF)    Does the patient have the potential to tolerate intense rehabilitation      Barriers to Discharge Decreased caregiver support      Equipment Recommendations  None recommended by PT    Recommendations for Other Services     Frequency Min 3X/week    Precautions / Restrictions Precautions Precautions: Fall Restrictions Weight Bearing Restrictions: No   Pertinent Vitals/Pain Pt c/o pain in back.  Received pain med prior to  Treatment today      Mobility  Bed Mobility Bed Mobility: Rolling Right;Rolling Left;Supine to Sit;Sit to Supine Rolling Right: 5: Supervision;With rail Rolling Left: 5: Supervision;With rail Supine to Sit: 4: Min guard;HOB elevated Sit to Supine: 4: Min assist;HOB elevated;With rail Details for Bed Mobility Assistance: needs min assis to lift legs up onto bed Transfers Transfers: Sit to Stand;Stand to Sit;Stand Pivot Transfers Sit to Stand: 4: Min assist Stand to Sit: 4: Min assist Stand Pivot Transfers: 4: Min assist Details for Transfer Assistance: min assist to lift hips off bed.  pt able to to step and pivot to [facility] Ambulation/Gait Ambulation/Gait Assistance: 5:  Supervision Assistive device: Rolling walker Ambulation/Gait Assistance Details: cues to step to head of bed Gait Pattern: Step-to pattern;Decreased step length - right;Decreased step length - left Gait velocity: decreased General Gait Details: pt  moves slowly, limited by pain and SOB with exertion Stairs: No Wheelchair Mobility Wheelchair Mobility: No    Exercises General Exercises - Lower Extremity Ankle Circles/Pumps: AROM;5 reps;Supine;Both Hip Flexion/Marching: AROM;Both;5 reps;Supine   PT Diagnosis: Difficulty walking;Generalized weakness;Acute pain  PT Problem List: Decreased strength;Decreased activity tolerance;Decreased balance;Decreased mobility;Cardiopulmonary status limiting activity PT Treatment Interventions:       PT Goals(Current goals can be found in the care plan section) Acute Rehab PT Goals Patient Stated Goal: daughter wants pt to get stronger so that she can go home PT Goal Formulation: With patient/family Time For Goal Achievement: 10/05/13 Potential to Achieve Goals: Good  Visit Information  Last PT Received On: 09/21/13 Assistance Needed: +1 History of Present Illness: pt was admitted with worsening confusion.  has pna. Pt had bronchoscopy 10/13 and is planned for possible thoracocentesis 10/16       Prior Functioning  Home Living Family/patient expects to be discharged to:: Private residence (daughter unsure if pt will d/c to home or to short term SNF) Living Arrangements: Children Available Help at Discharge: Family Prior Function Level of Independence: Needs assistance Gait / Transfers Assistance Needed: pt states she didnt use an assistive device PTA ADL's / Homemaking Assistance Needed: daughter helps with meals, household tasks. Pt states she did own bath, dress, and basic ADL Communication Communication: [facility]    Cognition  Cognition Arousal/Alertness: Awake/alert  Behavior During Therapy: WFL for tasks assessed/performed Overall  Cognitive Status: Within Functional Limits for tasks assessed    Extremity/Trunk Assessment Lower Extremity Assessment Lower Extremity Assessment: Overall WFL for tasks assessed;Generalized weakness (pt very thin with decreased muscle mass) Cervical / Trunk Assessment Cervical / Trunk Assessment: Other exceptions;Kyphotic Cervical / Trunk Exceptions: pt very thin, c/o back pain   Balance Balance Balance Assessed: Yes Static Sitting Balance Static Sitting - Balance Support: Feet supported Static Sitting - Level of Assistance: 7: Independent Static Standing Balance Static Standing - Balance Support: Bilateral upper extremity supported;During functional activity  End of Session PT - End of Session Equipment Utilized During Treatment: Oxygen Activity Tolerance: Patient limited by pain Patient left: in bed;with family/visitor present  GP    Leslie [name], Leslie [name] [phone] 09/21/2013, 4:12 PM

## 2013-09-21 NOTE — Progress Notes (Addendum)
[facility] PROGRESS NOTE  WALTER MIN ZOX:[id number] DOB: Jan 24, 1929 DOA: 09/14/2013 PCP: [name], MD  Assessment/Plan: 1. LLL pneumonia HCAP; although patient does not quite meet the criteria for HCAP we broadened the antibiotic coverage to meet this criteria. Discussed with pulmonology . -DCed azithromycin and ceftriaxone -Continue  Zosyn + vancomycin   -Continue patient on albuterol nebulizers,  Dulera, obtain RR/SpO2 vitals every 4 hours -Continue Mucinex -continue pulmonary toilet -Decrease normal saline to 24ml/hr -Patient's WBC continues to decrease currently= 10.2, down from a high of 17.9 at admission -Counseled patient and family would increase patient's activity in the a.m.; will obtain ambulatory SpO2  2. LT parapneumonic effusion; Chest CT with and without contrast rule out recurrence of squamous cell lung cancer vs adenocarcinoma of the breast; see results below -Consulted Plmonology (Dr Kalman Shan) who agrees patient needs a bronchoscopy however feels patient is too weak currently and will defer until patient's stronger.  - Spoke w/ Dr Vassie Loll (pulmonology) secondary to opacifications worsening left lung  fields and worsening air movement. Dr. Vassie Loll has agreed to FOB sometime today. Left Msg on phone to RN Bridget Hartshorn (Daughter who has MPOA) -Patient S/P cast SOB on 09/19/2013; which has resulted in increased aeration to left upper lobe.  -CXR 09/21/2013 showed increased left-sided pleural effusion. [facility] M. have scheduled IR thoracentesis  3. COPD; continue O2 to maintain SPO2> 92% -Continue patient's albuterol nebulizer -Continue Dulera -RR/SpO2 vitals every 4 hours  4. Anxiety; Continue Chlordiazepoxide (home medication)  5. Irritable bowel syndrome; Continue Bentyl (home medication)   6.  Depression;  Continue Remeron + Zoloft (home regimen)  7. Malnutrition; restart Megace 800 mg daily -Per daughter she is eating better   8. Altered mental  status; patient remains mildly confused, but pleasant.   9. chronic pain; continue home medication  10. Pulmonary hypertension; Echocardiogram complete see results below  11. Hypokalemia; will replete  Code Status: DNR Family Communication: Discussed plan of care with both daughters and granddaughter. Bridget Hartshorn has medical power of attorney [phone] Disposition Plan:    Consultants:  Pulmonology (Dr Kalman Shan)   Procedures: CXR post bronchoscopy 09/19/2013 Official read COPD changes with unchanged appearance of the left lung and slightly  increased opacity at the right base which could represent  atelectasis or infiltrate. My viewing; comparison of post bronchoscopy CXR with pre-bronchoscopy CXR; increased aeration of the LUL post bronchoscopy  BAL from 09/19/2013 AFB; negative to date BAL LUL; negative to date BAL LUL fungal smear/culture; negative to date  CXR 09/19/2013 Advanced changes of COPD/ emphysema noted. There is progressive  worsening aeration to the left lung with possible increase in volume  of left pleural effusion. Right lung appears clear.    CXR 09/15/2013 Interval development of patchy airspace disease throughout the left  lower lobe concerning for pneumonia. - Possible small parapneumonic effusion. - left upper lobectomy with volume loss in the left hemi thorax with right-to-left shift of the cardiac and mediastinal contours.  -Pulmonary hyperexpansion, emphysema and chronic bronchitic changes remain stable.   CT head without contrast 09/15/2013 No acute intracranial abnormality.  Stable atrophy and chronic microvascular ischemic white matter  Changes.  CT chest with contrast 09/15/2013;  1. Consolidative opacity within the remaining left lung.  Additionally there is endobronchial material and occlusion of the  left mainstem bronchus. Considerations for the findings within the  left lung include pneumonia, postobstructive pneumonitis,  aspiration  or recurrent malignancy. Considering the endobronchial material  within the left mainstem  bronchus, correlation with bronchoscopy  should be considered to exclude endobronchial mass.  2. Small bilateral pleural effusions.  3. Dilation of the ascending thoracic aorta.  4. Enlargement of the main pulmonary artery as can be seen with  pulmonary arterial hypertension.   Echocardiogram;   - Left ventricle: The cavity size was normal. Wall thickness was normal. Systolic function was normal.  -LVEF=  60% to 65%.  -(grade 1 diastolicdysfunction). - Aortic valve: Mild regurgitation. - Mitral valve: There is a mild prolapse of the posterior leaflet of the mitral valve without any associated functional abnormality. - Atrial septum: No defect or patent foramen ovale was identified. - Pericardium, extracardiac: A trivial pericardial effusion was identified.    Antibiotics:  Azithromycin 10/10>>>D/Ced 10/10   Ceftriaxone 10/9>>D/Ced 10/10   Zosyn 10/10 >>  Vancomycin 10/10 >>    HPI/Subjective: 79 yo WF PMHx IBS, Osteoporosis, squamous cell lung cancer (s/p left thoracotomy w/ LLLobectomy for squamous cell ca 10/99 by Dr Edwyna Shell... no known recurrence)., adenocarcinoma of the breast (left breast lumpectomy and sentinel node biopsy 12/03 by Dr Ezzard Standing for a 2.2cm infiltrating carcinoma, neg LN's, ER/PR pos, treated w/ XRT, then Tamoxifen for 70yrs), COPD, alcoholism, anxiety, anemia, lives at home with son comes in with worsening confusion today, no fevers. Daughter with her now. No n/v/d. No rashes. No cough. No sob. No complaints of pain. She was just more confused than normal. Fairly independent at home. Has not complaints right now in ED. cxr shows pna. No recent abx. 09/15/2013 states feels mildly SOB while on 2 L 02 via [place]. States does not use O2 at home. Negative CP. 09/16/2013 patient confused today however pleasant. Follows commands but answers questions inappropriately.  09/17/2013 patient still mildly confused however very cooperative, decreased WOB, however still becomes SOB when off her O2. 09/18/2013 patient pleasantly confused, however does recognize family members in the room. 09/19/2013 patient continues to be pleasantly confused, currently sitting in bed finishing her breakfast. Still requiring 2 L O2 via nasal cannula while sedentary. 09/20/2013 patient much better spirits, joking with family and medical staff. The WOB decreased. Per family patient ate approximately half of her lunch (coleslaw ice cream and a bite or 2 of barbecue). TODAY patient aware of pending thoracentesis, states has been working with PT during the day but still extremely weak and only able to take a few steps      Objective: Filed Vitals:   09/21/13 0932 09/21/13 1300 09/21/13 1420 09/21/13 2139  BP:  137/77  174/80  Pulse:  89  77  Temp:  98.4 F (36.9 C)  98.3 F (36.8 C)  TempSrc:  Oral  Oral  Resp:  20  18  Height:      Weight:      SpO2: 92% 94% 93% 94%    Intake/Output Summary (Last 24 hours) at 09/21/13 2157 Last data filed at 09/21/13 1700  Gross per 24 hour  Intake      0 ml  Output    300 ml  Net   -300 ml   Filed Weights   09/16/13 1700  Weight: 32.9 kg (72 lb 8.5 oz)    Exam:   General: A./O. x1, continued confusion, follows commands (daughter states patient always confused when hospitalized)  Cardiovascular:, Regular rhythm and rate , negative murmurs rubs or gallops, DP/PT pulse 2+ bilateral   Respiratory: Rt Lung CTA (mild expiratory wheezing ), Lt Lung increased movement of air in the Lt lower lung fields compared to  yesterday, increased air movement in the apex of the left lung   Abdomen: Soft, nontender, nondistended plus bowel sounds  Musculoskeletal: Very cachectic female, negative pedal edema   Data Reviewed: Basic Metabolic Panel:  Recent Labs Lab 09/16/13 1832 09/17/13 0617 09/18/13 0738 09/19/13 0615 09/20/13 0530  09/21/13 0645  NA  --  143 143 142 145 143  K  --  3.1* 3.5 3.5 3.0* 3.6  CL  --  114* 113* 112 112 107  CO2  --  21 21 26 26 29   GLUCOSE  --  101* 90 95 98 85  BUN  --  4* 3* 3* 4* 3*  CREATININE  --  0.60 0.62 0.64 0.68 0.62  CALCIUM  --  7.5* 8.1* 8.2* 8.6 8.7  MG  --  1.9 1.9 1.8 1.8 1.6  PHOS 2.3  --   --   --   --   --    Liver Function Tests:  Recent Labs Lab 09/17/13 0617 09/18/13 0738 09/19/13 0615 09/20/13 0530 09/21/13 0645  AST 17 14 15 10 13   ALT 12 11 13 10 10   ALKPHOS 73 69 47 46 42  BILITOT 0.3 0.2* 0.2* 0.2* <0.1*  PROT 5.0* 5.2* 5.1* 5.0* 4.9*  ALBUMIN 1.9* 2.0* 2.0* 2.0* 1.9*   No results found for this basename: LIPASE, AMYLASE,  in the last 168 hours No results found for this basename: AMMONIA,  in the last 168 hours CBC:  Recent Labs Lab 09/17/13 0617 09/18/13 0738 09/19/13 0615 09/20/13 0530 09/21/13 0645  WBC 11.4* 8.8 9.7 10.2 9.7  NEUTROABS 8.9* 6.3 6.7 7.1 6.6  HGB 9.3* 9.8* 9.5* 9.7* 9.3*  HCT 27.9* 29.9* 29.1* 29.5* 28.8*  MCV 90.0 90.3 90.1 90.2 90.3  PLT 281 295 363 425* 401*   Cardiac Enzymes:  Recent Labs Lab 09/16/13 1832  TROPONINI <0.30   BNP (last 3 results)  Recent Labs  09/16/13 1832  PROBNP 3115.0*   CBG:  Recent Labs Lab 09/14/13 2219  GLUCAP 105*    Recent Results (from the past 240 hour(s))  CULTURE, BLOOD (ROUTINE X 2)     Status: None   Collection Time    09/15/13  6:45 AM      Result Value Range Status   Specimen Description BLOOD LEFT ARM   Final   Special Requests BOTTLES DRAWN AEROBIC ONLY 1CC   Final   Culture  Setup Time     Final   Value: 09/15/2013 10:29     Performed at [facility]   Culture     Final   Value: NO GROWTH 5 DAYS     Performed at [facility]   Report Status 09/21/2013 FINAL   Final  CULTURE, BLOOD (ROUTINE X 2)     Status: None   Collection Time    09/15/13  6:55 AM      Result Value Range Status   Specimen Description BLOOD LEFT HAND   Final    Special Requests     Final   Value: BOTTLES DRAWN AEROBIC AND ANAEROBIC 3CC AER 2CC ANA   Culture  Setup Time     Final   Value: 09/15/2013 10:29     Performed at [facility]   Culture     Final   Value: NO GROWTH 5 DAYS     Performed at [facility]   Report Status 09/21/2013 FINAL   Final  MRSA PCR SCREENING     Status:  None   Collection Time    09/16/13  6:31 PM      Result Value Range Status   MRSA by PCR NEGATIVE  NEGATIVE Final   Comment:            The GeneXpert MRSA Assay (FDA     approved for NASAL specimens     only), is one component of a     comprehensive MRSA colonization     surveillance program. It is not     intended to diagnose MRSA     infection nor to guide or     monitor treatment for     MRSA infections.  AFB CULTURE WITH SMEAR     Status: None   Collection Time    09/19/13  3:04 PM      Result Value Range Status   Specimen Description BRONCHIAL ALVEOLAR LAVAGE LUL   Final   Special Requests NONE   Final   ACID FAST SMEAR     Final   Value: NO ACID FAST BACILLI SEEN     Performed at [facility]   Culture     Final   Value: CULTURE WILL BE EXAMINED FOR 6 WEEKS BEFORE ISSUING A FINAL REPORT     Performed at [facility]   Report Status PENDING   Incomplete  CULTURE, BAL-QUANTITATIVE     Status: None   Collection Time    09/19/13  3:04 PM      Result Value Range Status   Specimen Description BRONCHIAL ALVEOLAR LAVAGE LUL   Final   Special Requests NONE   Final   Gram Stain     Final   Value: MODERATE WBC PRESENT, PREDOMINANTLY PMN     NO SQUAMOUS EPITHELIAL CELLS SEEN     NO ORGANISMS SEEN     Performed at [facility] Count PENDING   Incomplete   Culture     Final   Value: Culture reincubated for better growth     Performed at [facility]   Report Status PENDING   Incomplete  FUNGUS CULTURE W SMEAR     Status: None   Collection Time    09/19/13  3:04 PM      Result Value Range Status    Specimen Description BRONCHIAL ALVEOLAR LAVAGE LUL   Final   Special Requests NONE   Final   Fungal Smear     Final   Value: NO YEAST OR FUNGAL ELEMENTS SEEN     Performed at [facility]   Culture     Final   Value: CULTURE IN PROGRESS FOR FOUR WEEKS     Performed at [facility]   Report Status PENDING   Incomplete     Studies: Dg Chest 2 View  09/21/2013   CLINICAL DATA:  Cough. Left lower lobe pneumonia.  EXAM: CHEST  2 VIEW  COMPARISON:  CHEST x-ray 09/19/2013.  FINDINGS: Significant volume loss in the left hemithorax is evident with marked right-to-left shift of cardiomediastinal structures, similar to the prior study. This is in part related to prior left lower lobectomy, although there is undoubtedly significant atelectasis and consolidation in the remaining left upper lobe. Right lung appears relatively clear, although there is some patchy mild diffuse bronchial wall thickening, most pronounced in the right base. Moderate to large left pleural effusion is unchanged. No evidence of pulmonary edema. Heart size is likely normal, but cardiac silhouette is largely obscured. Atherosclerosis in  the thoracic aorta.  IMPRESSION: 1. Overall, the radiographic appearance the chest is very similar to prior examination from 2 days ago as discussed above, with exception of slight increase in the large left pleural effusion.   Electronically Signed   By: Trudie Reed M.D.   On: 09/21/2013 09:29    Scheduled Meds: . albuterol  2.5 mg Nebulization TID  . dextromethorphan-guaiFENesin  1 tablet Oral BID  . dicyclomine  20 mg Oral TID AC  . enoxaparin (LOVENOX) injection  30 mg Subcutaneous Q24H  . gabapentin  300 mg Oral BID  . lactose free nutrition  237 mL Oral BID BM  . megestrol  800 mg Oral Daily  . mirtazapine  15 mg Oral QHS  . mometasone-formoterol  2 puff Inhalation BID  . pantoprazole  20 mg Oral Daily  . piperacillin-tazobactam (ZOSYN)  IV  3.375 g Intravenous Q8H   . saccharomyces boulardii  250 mg Oral BID  . sertraline  100 mg Oral Daily   Continuous Infusions: . sodium chloride 50 mL/hr at 09/21/13 1610    Principal Problem:   CAP (community acquired pneumonia) Active Problems:   CARCINOMA, LUNG, SQUAMOUS CELL   ADENOCARCINOMA, BREAST   ANXIETY   ALCOHOLISM   HEARING LOSS   COPD   PSORIASIS   Memory loss   Acute respiratory failure with hypoxia   Altered mental status   Chronic pain syndrome   Parapneumonic effusion   Protein-calorie malnutrition, severe   Hypokalemia   Depression    Time spent: 35 minutes   Harrol Novello, J  [facility] Pager [phone]. If 7PM-7AM, please contact night-coverage at [email or web address], password [facility] 09/21/2013, 9:57 PM  LOS: 7 days

## 2013-09-22 ENCOUNTER — Inpatient Hospital Stay (HOSPITAL_COMMUNITY): Payer: Medicare Other

## 2013-09-22 DIAGNOSIS — E876 Hypokalemia: Secondary | ICD-10-CM

## 2013-09-22 DIAGNOSIS — F411 Generalized anxiety disorder: Secondary | ICD-10-CM

## 2013-09-22 LAB — COMPREHENSIVE METABOLIC PANEL
AST: 15 U/L (ref 0–37)
Albumin: 2.1 g/dL — ABNORMAL LOW (ref 3.5–5.2)
BUN: 6 mg/dL (ref 6–23)
CO2: 29 mEq/L (ref 19–32)
Calcium: 8.7 mg/dL (ref 8.4–10.5)
Creatinine, Ser: 0.66 mg/dL (ref 0.50–1.10)
GFR calc non Af Amer: 81 mL/min — ABNORMAL LOW (ref >90)
Sodium: 143 mEq/L (ref 135–145)
Total Protein: 5.3 g/dL — ABNORMAL LOW (ref 6.0–8.3)

## 2013-09-22 LAB — CBC WITH DIFFERENTIAL/PLATELET
Eosinophils Absolute: 0.1 10*3/uL (ref 0.0–0.7)
Eosinophils Relative: 1 % (ref 0–5)
HCT: 30.2 % — ABNORMAL LOW (ref 36.0–46.0)
Hemoglobin: 9.8 g/dL — ABNORMAL LOW (ref 12.0–15.0)
Lymphocytes Relative: 22 % (ref 12–46)
Lymphs Abs: 2.2 10*3/uL (ref 0.7–4.0)
MCH: 29.2 pg (ref 26.0–34.0)
MCV: 89.9 fL (ref 78.0–100.0)
Monocytes Absolute: 0.8 10*3/uL (ref 0.1–1.0)
Monocytes Relative: 8 % (ref 3–12)
Neutro Abs: 7.1 10*3/uL (ref 1.7–7.7)
Neutrophils Relative %: 70 % (ref 43–77)
Platelets: 477 10*3/uL — ABNORMAL HIGH (ref 150–400)
RBC: 3.36 MIL/uL — ABNORMAL LOW (ref 3.87–5.11)
WBC: 10.2 10*3/uL (ref 4.0–10.5)

## 2013-09-22 LAB — MAGNESIUM: Magnesium: 1.8 mg/dL (ref 1.5–2.5)

## 2013-09-22 MED ORDER — POTASSIUM CHLORIDE 20 MEQ/15ML (10%) PO LIQD
20.0000 meq | Freq: Every day | ORAL | Status: DC
  Administered 2013-09-22 – 2013-09-26 (×5): 20 meq via ORAL
  Filled 2013-09-22 (×5): qty 15

## 2013-09-22 MED ORDER — POTASSIUM CHLORIDE 10 MEQ/100ML IV SOLN
10.0000 meq | INTRAVENOUS | Status: AC
  Administered 2013-09-22 – 2013-09-23 (×3): 10 meq via INTRAVENOUS
  Filled 2013-09-22 (×3): qty 100

## 2013-09-22 MED ORDER — ENSURE COMPLETE PO LIQD
237.0000 mL | Freq: Three times a day (TID) | ORAL | Status: DC
  Administered 2013-09-23 – 2013-09-24 (×4): 237 mL via ORAL

## 2013-09-22 MED ORDER — POTASSIUM CHLORIDE 20 MEQ PO PACK
20.0000 meq | PACK | Freq: Every day | ORAL | Status: DC
  Filled 2013-09-22: qty 1

## 2013-09-22 MED ORDER — MAGNESIUM OXIDE 400 (241.3 MG) MG PO TABS
200.0000 mg | ORAL_TABLET | Freq: Every day | ORAL | Status: DC
  Administered 2013-09-22 – 2013-09-26 (×5): 200 mg via ORAL
  Filled 2013-09-22 (×5): qty 0.5

## 2013-09-22 NOTE — Progress Notes (Signed)
BRIEF PATIENT DESCRIPTION:  79 yo female with MHx Breast cancer, COPD, squamous cell lung cancer, etoh abuse presented to [facility] 10/8 with altered mental status. Had a fall from toilet to floor earlier that day. Found to have LLL PNA and ?bilateral effusions and was admitted. PCCM asked to consult for possible recurrence or metastasis of lung cancer or breast cancer, respectively.   SIGNIFICANT EVENTS / STUDIES:  10/8 chest xray >>> Left lower lobe pneumonia, ? small associated parapneumonic effusion, s/p LLL lobectomy  10/8 CT head >>> no acute findings  10/9 CT chest w/contrast >>> consolidation in LL, endobronch material and occlusion of main bronchus, small bilateral pleural effusions, dilated ascending aorta, enlarged main pulmonary artery  10-13 FOB with brushings and bal. LINES / TUBES:  PIV x 1   CULTURES:  10/8 UA >>>negative  10/9 Blood Cx >>> ng 10/9 Urine strep/legionella antibody >>> negative  10-13 bal>>nos>>PSEUDOMONAS AERUGINOSA>>  10-13 brushings>>no malignant cells  ANTIBIOTICS:  Ceftriaxone 10/8 >>> off Azithromycin 10/8 >>>off 10-13 vanc>>10/15 10-10 zoysn>>   Subjective: Sitting up in bed, nad @rest . Appears frail. No CP, dyspnea, very HOH  Objective: Vital signs in last 24 hours: Blood pressure 165/78, pulse 74, temperature 98.1 F (36.7 C), temperature source Oral, resp. rate 14, height 4' 11.84" (1.52 m), weight 72 lb 8.5 oz (32.9 kg), SpO2 96.00%.  Intake/Output from previous day: 10/15 0701 - 10/16 0700 In: 1383 [I.V.:1233; IV Piggyback:150] Out: 300 [Urine:300]   Physical Exam:   thin female in nad. Very Hard of hearing Nose without purulence or d/c noted, OP clear, neck without LN or TMG Chest with decreased bs, a few crackles and rhonchi, no wheezing.  CVs- s1s2 nml Abd -soft, non tender Ext - no edema  Lab Results:  Recent Labs  09/20/13 0530 09/21/13 0645 09/22/13 0515  WBC 10.2 9.7 10.2  HGB 9.7* 9.3* 9.8*  HCT 29.5* 28.8* 30.2*   PLT 425* 401* 477*   BMET  Recent Labs  09/20/13 0530 09/21/13 0645 09/22/13 0515  NA 145 143 143  K 3.0* 3.6 3.3*  CL 112 107 106  CO2 26 29 29   GLUCOSE 98 85 89  BUN 4* 3* 6  CREATININE 0.68 0.62 0.66  CALCIUM 8.6 8.7 8.7    Studies/Results: Dg Chest 2 View  09/21/2013   CLINICAL DATA:  Cough. Left lower lobe pneumonia.  EXAM: CHEST  2 VIEW  COMPARISON:  CHEST x-ray 09/19/2013.  FINDINGS: Significant volume loss in the left hemithorax is evident with marked right-to-left shift of cardiomediastinal structures, similar to the prior study. This is in part related to prior left lower lobectomy, although there is undoubtedly significant atelectasis and consolidation in the remaining left upper lobe. Right lung appears relatively clear, although there is some patchy mild diffuse bronchial wall thickening, most pronounced in the right base. Moderate to large left pleural effusion is unchanged. No evidence of pulmonary edema. Heart size is likely normal, but cardiac silhouette is largely obscured. Atherosclerosis in the thoracic aorta.  IMPRESSION: 1. Overall, the radiographic appearance the chest is very similar to prior examination from 2 days ago as discussed above, with exception of slight increase in the large left pleural effusion.   Electronically Signed   By: Trudie Reed M.D.   On: 09/21/2013 09:29    Assessment/Plan:  1) acute on chronic respiratory failure /copd/ LLL pna.   cxr today with ? LLL pna + effusion  clinically stable with no fever or increased wob.  S/p LLL  obectomy 1999 ([facility]) No endobronchial lesions on bronchoscopy  -continue aggressive BD regimen -f/u culture data. psuedomonas ss pending>>ct zosyn & simplify - Can dc vanc since BAL cx remains neg . -For thora 10-16 @10  am  - discussed extensively with pts daughter, risks & benefits of thoracentesis discussed - IR to tap under US guidance 10-16 Plan for SNF on discharge DNR was reversed for bscopy,  should be reinstatedafter procedure   Leslie [name] V.  [phone] 09/22/2013, 10:26 AM

## 2013-09-22 NOTE — Progress Notes (Signed)
Physical Therapy Treatment Patient Details Name: Leslie [name] MRN: [id number] DOB: 1929/01/02 Today's Date: 09/22/2013 Time: 9528-4132 PT Time Calculation (min): 26 min  PT Assessment / Plan / Recommendation  History of Present Illness pt was admitted with worsening confusion.  has pna. Pt had bronchoscopy 10/13 and is planned for possible thoracocentesis 10/16   PT Comments   Pt progressing , increased activity tolerance today  Follow Up Recommendations  Home health PT;Supervision/Assistance - 24 hour (unless dtr feels she needs STSNF)     Does the patient have the potential to tolerate intense rehabilitation     Barriers to Discharge        Equipment Recommendations  None recommended by PT    Recommendations for Other Services    Frequency Min 3X/week   Progress towards PT Goals Progress towards PT goals: Progressing toward goals  Plan Current plan remains appropriate    Precautions / Restrictions Precautions Precautions: Fall Precaution Comments: monitor sats   Pertinent Vitals/Pain See gait comments    Mobility  Bed Mobility Bed Mobility: Supine to Sit Supine to Sit: 4: Min guard;HOB flat Sit to Supine: 4: Min assist;HOB flat Details for Bed Mobility Assistance: assist to lift LEs into bed, visual cues to complete task Transfers Transfers: Sit to Stand;Stand to Sit Sit to Stand: 4: Min assist Stand to Sit: 4: Min assist Details for Transfer Assistance: min for wt shift Ambulation/Gait Ambulation/Gait Assistance: 4: Min guard;4: Min Environmental consultant (Feet): 56 Feet Assistive device: Rolling walker Ambulation/Gait Assistance Details: verbal cues for step length, min assist to maneuver RW/avoid obstacles Gait Pattern: Step-through pattern;Decreased stride length Gait velocity: decreased General Gait Details: not as dyspneic today; O2 sats 96% at rest, 94-95% on 3L O2 after amb    Exercises General Exercises - Lower Extremity Ankle Circles/Pumps:  AROM;Strengthening;Both;10 reps;Supine Heel Slides: AAROM;Both;10 reps;Supine Hip ABduction/ADduction: AROM;AAROM;Both;10 reps;Supine Straight Leg Raises: Both;10 reps;Strengthening;Supine   PT Diagnosis:    PT Problem List:   PT Treatment Interventions:     PT Goals (current goals can now be found in the care plan section) Acute Rehab PT Goals Patient Stated Goal: daughter wants pt to get stronger so that she can go home Time For Goal Achievement: 10/05/13 Potential to Achieve Goals: Good  Visit Information  Last PT Received On: 09/22/13 Assistance Needed: +1 History of Present Illness: pt was admitted with worsening confusion.  has pna. Pt had bronchoscopy 10/13 and is planned for possible thoracocentesis 10/16    Subjective Data  Subjective: she may "fight' you per dtr Patient Stated Goal: daughter wants pt to get stronger so that she can go home   Cognition  Cognition Arousal/Alertness: Awake/alert Behavior During Therapy: WFL for tasks assessed/performed Overall Cognitive Status: Within Functional Limits for tasks assessed    Balance  Static Sitting Balance Static Sitting - Balance Support: Feet supported Static Sitting - Level of Assistance: 7: Independent  End of Session PT - End of Session Equipment Utilized During Treatment: Oxygen Activity Tolerance: Patient tolerated treatment well Patient left: in bed;with family/visitor present;with call bell/phone within reach   GP     [facility] 09/22/2013, 4:07 PM

## 2013-09-22 NOTE — Progress Notes (Signed)
[facility] PROGRESS NOTE  Leslie [name] YQM:[id number] DOB: 17-Feb-1929 DOA: 09/14/2013 PCP: [name], MD  Assessment/Plan: 1. LLL pneumonia HCAP Pseudomonas Aeruginosa; Continue  Zosyn + vancomycin   -Continue patient on albuterol nebulizers,  Dulera, obtain RR/SpO2 vitals every 4 hours -Continue Mucinex -continue pulmonary toilet -Decrease normal saline to 54ml/hr -Patient's WBC continues to decrease currently= 10.2, down from a high of 17.9 at admission -Counseled patient and family would increase patient's activity in the a.m.; will obtain ambulatory SpO2 -PT/OT evaluate patient; recommend SNF  2. LT parapneumonic effusion; Chest CT with and without contrast rule out recurrence of squamous cell lung cancer vs adenocarcinoma of the breast; see results below -Consulted Plmonology (Dr Kalman Shan) who agrees patient needs a bronchoscopy however feels patient is too weak currently and will defer until patient's stronger.  - Spoke w/ Dr Vassie Loll (pulmonology) secondary to opacifications worsening left lung  fields and worsening air movement. Dr. Vassie Loll has agreed to FOB sometime today. Left Msg on phone to RN Bridget Hartshorn (Daughter who has MPOA) -Patient S/P cast SOB on 09/19/2013; which has resulted in increased aeration to left upper lobe.  -CXR 09/21/2013 showed increased left-sided pleural effusion. [facility] M. have scheduled IR thoracentesis; ADDENDUM no pleural effusion found procedure capsule 10/16   COPD; continue O2 to maintain SPO2> 92% -Continue patient's albuterol nebulizer -Continue Dulera -RR/SpO2 vitals every 4 hours  3. Anxiety; Continue Chlordiazepoxide (home medication)  4. Irritable bowel syndrome; Continue Bentyl (home medication)   5.  Depression;  Continue Remeron + Zoloft (home regimen)  6. Malnutrition; restart Megace 800 mg daily -Per daughter she is eating better   8. Altered mental status; resolved, patient alert and oriented.   9. chronic pain;  continue home medication  10. Pulmonary hypertension; Echocardiogram complete see results below  11. Hypokalemia; will replete  Code Status: DNR Family Communication: Discussed plan of care with both daughters and granddaughter. Bridget Hartshorn has medical power of attorney [phone] Disposition Plan:    Consultants:  Pulmonology (Dr Kalman Shan)   Procedures: CXR post bronchoscopy 09/19/2013 Official read COPD changes with unchanged appearance of the left lung and slightly  increased opacity at the right base which could represent  atelectasis or infiltrate. My viewing; comparison of post bronchoscopy CXR with pre-bronchoscopy CXR; increased aeration of the LUL post bronchoscopy  BAL from 09/19/2013 AFB; negative to date BAL LUL; pseudomonas aeruginosa  BAL LUL fungal smear/culture; negative to date  CXR 09/19/2013 Advanced changes of COPD/ emphysema noted. There is progressive  worsening aeration to the left lung with possible increase in volume  of left pleural effusion. Right lung appears clear.    CXR 09/15/2013 Interval development of patchy airspace disease throughout the left  lower lobe concerning for pneumonia. - Possible small parapneumonic effusion. - left upper lobectomy with volume loss in the left hemi thorax with right-to-left shift of the cardiac and mediastinal contours.  -Pulmonary hyperexpansion, emphysema and chronic bronchitic changes remain stable.   CT head without contrast 09/15/2013 No acute intracranial abnormality.  Stable atrophy and chronic microvascular ischemic white matter  Changes.  CT chest with contrast 09/15/2013;  1. Consolidative opacity within the remaining left lung.  Additionally there is endobronchial material and occlusion of the  left mainstem bronchus. Considerations for the findings within the  left lung include pneumonia, postobstructive pneumonitis, aspiration  or recurrent malignancy. Considering the endobronchial  material  within the left mainstem bronchus, correlation with bronchoscopy  should be considered to exclude endobronchial mass.  2. Small bilateral pleural effusions.  3. Dilation of the ascending thoracic aorta.  4. Enlargement of the main pulmonary artery as can be seen with  pulmonary arterial hypertension.   Echocardiogram;   - Left ventricle: The cavity size was normal. Wall thickness was normal. Systolic function was normal.  -LVEF=  60% to 65%.  -(grade 1 diastolicdysfunction). - Aortic valve: Mild regurgitation. - Mitral valve: There is a mild prolapse of the posterior leaflet of the mitral valve without any associated functional abnormality. - Atrial septum: No defect or patent foramen ovale was identified. - Pericardium, extracardiac: A trivial pericardial effusion was identified.    Antibiotics:  Azithromycin 10/10>>>D/Ced 10/10   Ceftriaxone 10/9>>D/Ced 10/10   Zosyn 10/10 >>  Vancomycin 10/10 >>    HPI/Subjective: 79 yo WF PMHx IBS, Osteoporosis, squamous cell lung cancer (s/p left thoracotomy w/ LLLobectomy for squamous cell ca 10/99 by Dr Edwyna Shell... no known recurrence)., adenocarcinoma of the breast (left breast lumpectomy and sentinel node biopsy 12/03 by Dr Ezzard Standing for a 2.2cm infiltrating carcinoma, neg LN's, ER/PR pos, treated w/ XRT, then Tamoxifen for 48yrs), COPD, alcoholism, anxiety, anemia, lives at home with son comes in with worsening confusion today, no fevers. Daughter with her now. No n/v/d. No rashes. No cough. No sob. No complaints of pain. She was just more confused than normal. Fairly independent at home. Has not complaints right now in ED. cxr shows pna. No recent abx. 09/15/2013 states feels mildly SOB while on 2 L 02 via [place]. States does not use O2 at home. Negative CP. 09/16/2013 patient confused today however pleasant. Follows commands but answers questions inappropriately. 09/17/2013 patient still mildly confused however very cooperative,  decreased WOB, however still becomes SOB when off her O2. 09/18/2013 patient pleasantly confused, however does recognize family members in the room. 09/19/2013 patient continues to be pleasantly confused, currently sitting in bed finishing her breakfast. Still requiring 2 L O2 via nasal cannula while sedentary. 09/20/2013 patient much better spirits, joking with family and medical staff. The WOB decreased. Per family patient ate approximately half of her lunch (coleslaw ice cream and a bite or 2 of barbecue). 09/21/2013 patient aware of pending thoracentesis, states has been working with PT during the day but still extremely weak and only able to take a few steps TODAY is resting comfortably,    Objective: Filed Vitals:   09/22/13 1038 09/22/13 1357 09/22/13 1359 09/22/13 2045  BP: 170/86  150/73   Pulse: 88  85   Temp: 98.4 F (36.9 C)  98.1 F (36.7 C)   TempSrc: Oral  Oral   Resp: 14  16   Height:      Weight:      SpO2: 93% 97% 96% 97%    Intake/Output Summary (Last 24 hours) at 09/22/13 2154 Last data filed at 09/22/13 1914  Gross per 24 hour  Intake    933 ml  Output      0 ml  Net    933 ml   Filed Weights   09/16/13 1700  Weight: 32.9 kg (72 lb 8.5 oz)    Exam:   General: A./O. x1, continued confusion, follows commands (daughter states patient always confused when hospitalized)  Cardiovascular:, Regular rhythm and rate , negative murmurs rubs or gallops, DP/PT pulse 2+ bilateral   Respiratory: The auscultation bilateral   Abdomen: Soft, nontender, nondistended plus bowel sounds  Musculoskeletal: Very cachectic female, negative pedal edema   Data Reviewed: Basic Metabolic Panel:  Recent Labs Lab  09/16/13 1832  09/18/13 0738 09/19/13 0615 09/20/13 0530 09/21/13 0645 09/22/13 0515  NA  --   < > 143 142 145 143 143  K  --   < > 3.5 3.5 3.0* 3.6 3.3*  CL  --   < > 113* 112 112 107 106  CO2  --   < > 21 26 26 29 29   GLUCOSE  --   < > 90 95 98 85 89  BUN   --   < > 3* 3* 4* 3* 6  CREATININE  --   < > 0.62 0.64 0.68 0.62 0.66  CALCIUM  --   < > 8.1* 8.2* 8.6 8.7 8.7  MG  --   < > 1.9 1.8 1.8 1.6 1.8  PHOS 2.3  --   --   --   --   --   --   < > = values in this interval not displayed. Liver Function Tests:  Recent Labs Lab 09/18/13 0738 09/19/13 0615 09/20/13 0530 09/21/13 0645 09/22/13 0515  AST 14 15 10 13 15   ALT 11 13 10 10 10   ALKPHOS 69 47 46 42 48  BILITOT 0.2* 0.2* 0.2* <0.1* <0.1*  PROT 5.2* 5.1* 5.0* 4.9* 5.3*  ALBUMIN 2.0* 2.0* 2.0* 1.9* 2.1*   No results found for this basename: LIPASE, AMYLASE,  in the last 168 hours No results found for this basename: AMMONIA,  in the last 168 hours CBC:  Recent Labs Lab 09/18/13 0738 09/19/13 0615 09/20/13 0530 09/21/13 0645 09/22/13 0515  WBC 8.8 9.7 10.2 9.7 10.2  NEUTROABS 6.3 6.7 7.1 6.6 7.1  HGB 9.8* 9.5* 9.7* 9.3* 9.8*  HCT 29.9* 29.1* 29.5* 28.8* 30.2*  MCV 90.3 90.1 90.2 90.3 89.9  PLT 295 363 425* 401* 477*   Cardiac Enzymes:  Recent Labs Lab 09/16/13 1832  TROPONINI <0.30   BNP (last 3 results)  Recent Labs  09/16/13 1832  PROBNP 3115.0*   CBG: No results found for this basename: GLUCAP,  in the last 168 hours  Recent Results (from the past 240 hour(s))  CULTURE, BLOOD (ROUTINE X 2)     Status: None   Collection Time    09/15/13  6:45 AM      Result Value Range Status   Specimen Description BLOOD LEFT ARM   Final   Special Requests BOTTLES DRAWN AEROBIC ONLY 1CC   Final   Culture  Setup Time     Final   Value: 09/15/2013 10:29     Performed at [facility]   Culture     Final   Value: NO GROWTH 5 DAYS     Performed at [facility]   Report Status 09/21/2013 FINAL   Final  CULTURE, BLOOD (ROUTINE X 2)     Status: None   Collection Time    09/15/13  6:55 AM      Result Value Range Status   Specimen Description BLOOD LEFT HAND   Final   Special Requests     Final   Value: BOTTLES DRAWN AEROBIC AND ANAEROBIC 3CC AER 2CC ANA    Culture  Setup Time     Final   Value: 09/15/2013 10:29     Performed at [facility]   Culture     Final   Value: NO GROWTH 5 DAYS     Performed at [facility]   Report Status 09/21/2013 FINAL   Final  MRSA PCR SCREENING  Status: None   Collection Time    09/16/13  6:31 PM      Result Value Range Status   MRSA by PCR NEGATIVE  NEGATIVE Final   Comment:            The GeneXpert MRSA Assay (FDA     approved for NASAL specimens     only), is one component of a     comprehensive MRSA colonization     surveillance program. It is not     intended to diagnose MRSA     infection nor to guide or     monitor treatment for     MRSA infections.  AFB CULTURE WITH SMEAR     Status: None   Collection Time    09/19/13  3:04 PM      Result Value Range Status   Specimen Description BRONCHIAL ALVEOLAR LAVAGE LUL   Final   Special Requests NONE   Final   ACID FAST SMEAR     Final   Value: NO ACID FAST BACILLI SEEN     Performed at [facility]   Culture     Final   Value: CULTURE WILL BE EXAMINED FOR 6 WEEKS BEFORE ISSUING A FINAL REPORT     Performed at [facility]   Report Status PENDING   Incomplete  CULTURE, BAL-QUANTITATIVE     Status: None   Collection Time    09/19/13  3:04 PM      Result Value Range Status   Specimen Description BRONCHIAL ALVEOLAR LAVAGE LUL   Final   Special Requests NONE   Final   Gram Stain     Final   Value: MODERATE WBC PRESENT, PREDOMINANTLY PMN     NO SQUAMOUS EPITHELIAL CELLS SEEN     NO ORGANISMS SEEN     Performed at [facility] Count     Final   Value: 200 COLONIES/ML     Performed at [facility]     Final   Value: PSEUDOMONAS AERUGINOSA     Performed at [facility]   Report Status PENDING   Incomplete  FUNGUS CULTURE W SMEAR     Status: None   Collection Time    09/19/13  3:04 PM      Result Value Range Status   Specimen Description BRONCHIAL ALVEOLAR LAVAGE  LUL   Final   Special Requests NONE   Final   Fungal Smear     Final   Value: NO YEAST OR FUNGAL ELEMENTS SEEN     Performed at [facility]   Culture     Final   Value: CULTURE IN PROGRESS FOR FOUR WEEKS     Performed at [facility]   Report Status PENDING   Incomplete     Studies: Dg Chest 2 View  09/21/2013   CLINICAL DATA:  Cough. Left lower lobe pneumonia.  EXAM: CHEST  2 VIEW  COMPARISON:  CHEST x-ray 09/19/2013.  FINDINGS: Significant volume loss in the left hemithorax is evident with marked right-to-left shift of cardiomediastinal structures, similar to the prior study. This is in part related to prior left lower lobectomy, although there is undoubtedly significant atelectasis and consolidation in the remaining left upper lobe. Right lung appears relatively clear, although there is some patchy mild diffuse bronchial wall thickening, most pronounced in the right base. Moderate to large left pleural effusion is unchanged. No evidence of pulmonary edema.  Heart size is likely normal, but cardiac silhouette is largely obscured. Atherosclerosis in the thoracic aorta.  IMPRESSION: 1. Overall, the radiographic appearance the chest is very similar to prior examination from 2 days ago as discussed above, with exception of slight increase in the large left pleural effusion.   Electronically Signed   By: Trudie Reed M.D.   On: 09/21/2013 09:29   [place] Chest  09/22/2013   *RADIOLOGY REPORT*  Clinical Data: Pneumonia, left parapneumonic effusion  CHEST ULTRASOUND  Comparison: 09/21/2013  Findings: Limited ultrasound performed of the left chest with the patient upright.  Dense consolidation present in the left lower lobe.  Trace left pleural effusion.  Amount of fluid visualized by ultrasound is not enough to warrant the risk of thoracentesis. Therefore, thoracentesis not performed.  IMPRESSION: Trace left effusion by ultrasound.  Thoracentesis not performed.   Original Report  Authenticated By: Judie Petit. Shick, M.D.    Scheduled Meds: . albuterol  2.5 mg Nebulization TID  . dextromethorphan-guaiFENesin  1 tablet Oral BID  . dicyclomine  20 mg Oral TID AC  . enoxaparin (LOVENOX) injection  30 mg Subcutaneous Q24H  . feeding supplement (ENSURE COMPLETE)  237 mL Oral TID BM  . gabapentin  300 mg Oral BID  . magnesium oxide  200 mg Oral Daily  . megestrol  800 mg Oral Daily  . mirtazapine  15 mg Oral QHS  . mometasone-formoterol  2 puff Inhalation BID  . pantoprazole  20 mg Oral Daily  . piperacillin-tazobactam (ZOSYN)  IV  3.375 g Intravenous Q8H  . potassium chloride  20 mEq Oral Daily  . saccharomyces boulardii  250 mg Oral BID  . sertraline  100 mg Oral Daily   Continuous Infusions: . sodium chloride 50 mL/hr at 09/22/13 1914    Principal Problem:   CAP (community acquired pneumonia) Active Problems:   CARCINOMA, LUNG, SQUAMOUS CELL   ADENOCARCINOMA, BREAST   ANXIETY   ALCOHOLISM   HEARING LOSS   COPD   PSORIASIS   Memory loss   Acute respiratory failure with hypoxia   Altered mental status   Chronic pain syndrome   Parapneumonic effusion   Protein-calorie malnutrition, severe   Hypokalemia   Depression    Time spent: 35 minutes   WOODS, CURTIS, J  [facility] Pager [phone]. If 7PM-7AM, please contact night-coverage at [email or web address], password [facility] 09/22/2013, 9:54 PM  LOS: 8 days

## 2013-09-22 NOTE — Progress Notes (Signed)
CSW continues to follow for snf placement.  Chelle Cayton C. Areliz Rothman MSW, LCSW [phone]  

## 2013-09-22 NOTE — Progress Notes (Signed)
ANTIBIOTIC CONSULT NOTE - INITIAL  Pharmacy Consult for: Zosyn  Indication: PNA/BAL + psuedomonas  No Known Allergies  Patient Measurements: Height: 4' 11.84" (152 cm) Weight: 72 lb 8.5 oz (32.9 kg) IBW/kg (Calculated) : 45.14   Vital Signs: Temp: 98.1 F (36.7 C) (10/16 1359) Temp src: Oral (10/16 1359) BP: 150/73 mmHg (10/16 1359) Pulse Rate: 85 (10/16 1359) Intake/Output from previous day: 10/15 0701 - 10/16 0700 In: 1383 [I.V.:1233; IV Piggyback:150] Out: 300 [Urine:300] Intake/Output from this shift: Total I/O In: 250 [P.O.:250] Out: -   Labs:  Recent Labs  09/20/13 0530 09/21/13 0645 09/22/13 0515  WBC 10.2 9.7 10.2  HGB 9.7* 9.3* 9.8*  PLT 425* 401* 477*  CREATININE 0.68 0.62 0.66   Estimated Creatinine Clearance: 28.6 ml/min (by C-G formula based on Cr of 0.66).  Recent Labs  09/19/13 1711  VANCOTROUGH <5.0*     Microbiology: Recent Results (from the past 720 hour(s))  CULTURE, BLOOD (ROUTINE X 2)     Status: None   Collection Time    09/15/13  6:45 AM      Result Value Range Status   Specimen Description BLOOD LEFT ARM   Final   Special Requests BOTTLES DRAWN AEROBIC ONLY 1CC   Final   Culture  Setup Time     Final   Value: 09/15/2013 10:29     Performed at [facility]   Culture     Final   Value: NO GROWTH 5 DAYS     Performed at [facility]   Report Status 09/21/2013 FINAL   Final  CULTURE, BLOOD (ROUTINE X 2)     Status: None   Collection Time    09/15/13  6:55 AM      Result Value Range Status   Specimen Description BLOOD LEFT HAND   Final   Special Requests     Final   Value: BOTTLES DRAWN AEROBIC AND ANAEROBIC 3CC AER 2CC ANA   Culture  Setup Time     Final   Value: 09/15/2013 10:29     Performed at [facility]   Culture     Final   Value: NO GROWTH 5 DAYS     Performed at [facility]   Report Status 09/21/2013 FINAL   Final  MRSA PCR SCREENING     Status: None   Collection Time   09/16/13  6:31 PM      Result Value Range Status   MRSA by PCR NEGATIVE  NEGATIVE Final   Comment:            The GeneXpert MRSA Assay (FDA     approved for NASAL specimens     only), is one component of a     comprehensive MRSA colonization     surveillance program. It is not     intended to diagnose MRSA     infection nor to guide or     monitor treatment for     MRSA infections.  AFB CULTURE WITH SMEAR     Status: None   Collection Time    09/19/13  3:04 PM      Result Value Range Status   Specimen Description BRONCHIAL ALVEOLAR LAVAGE LUL   Final   Special Requests NONE   Final   ACID FAST SMEAR     Final   Value: NO ACID FAST BACILLI SEEN     Performed at [facility]   Culture     Final  Value: CULTURE WILL BE EXAMINED FOR 6 WEEKS BEFORE ISSUING A FINAL REPORT     Performed at [facility]   Report Status PENDING   Incomplete  CULTURE, BAL-QUANTITATIVE     Status: None   Collection Time    09/19/13  3:04 PM      Result Value Range Status   Specimen Description BRONCHIAL ALVEOLAR LAVAGE LUL   Final   Special Requests NONE   Final   Gram Stain     Final   Value: MODERATE WBC PRESENT, PREDOMINANTLY PMN     NO SQUAMOUS EPITHELIAL CELLS SEEN     NO ORGANISMS SEEN     Performed at [facility] Count     Final   Value: 200 COLONIES/ML     Performed at [facility]   Culture     Final   Value: PSEUDOMONAS AERUGINOSA     Performed at [facility]   Report Status PENDING   Incomplete  FUNGUS CULTURE W SMEAR     Status: None   Collection Time    09/19/13  3:04 PM      Result Value Range Status   Specimen Description BRONCHIAL ALVEOLAR LAVAGE LUL   Final   Special Requests NONE   Final   Fungal Smear     Final   Value: NO YEAST OR FUNGAL ELEMENTS SEEN     Performed at [facility]   Culture     Final   Value: CULTURE IN PROGRESS FOR FOUR WEEKS     Performed at [facility]   Report Status PENDING    Incomplete    Medical History: Past Medical History  Diagnosis Date  . Hearing loss   . COPD (chronic obstructive pulmonary disease)   . GERD (gastroesophageal reflux disease)   . IBS (irritable bowel syndrome)   . Gallstones   . Alcoholism   . Adenocarcinoma, breast   . Primary lung squamous cell carcinoma   . DJD (degenerative joint disease)   . Osteoporosis   . Neuropathy   . Anxiety   . Psoriasis   . Anemia     Medications:  Scheduled:  . albuterol  2.5 mg Nebulization TID  . dextromethorphan-guaiFENesin  1 tablet Oral BID  . dicyclomine  20 mg Oral TID AC  . enoxaparin (LOVENOX) injection  30 mg Subcutaneous Q24H  . feeding supplement (ENSURE COMPLETE)  237 mL Oral TID BM  . gabapentin  300 mg Oral BID  . magnesium oxide  200 mg Oral Daily  . megestrol  800 mg Oral Daily  . mirtazapine  15 mg Oral QHS  . mometasone-formoterol  2 puff Inhalation BID  . pantoprazole  20 mg Oral Daily  . piperacillin-tazobactam (ZOSYN)  IV  3.375 g Intravenous Q8H  . potassium chloride  20 mEq Oral Daily  . saccharomyces boulardii  250 mg Oral BID  . sertraline  100 mg Oral Daily   Infusions:  . sodium chloride 50 mL/hr at 09/22/13 0838   PRN: acetaminophen, albuterol, chlordiazePOXIDE, menthol-cetylpyridinium, traMADol Assessment:  79 yo F on D#8 total abx, D#7 Zosyn for LLL PNA, BAL from 10/13 growing pseudomonas.  CCM discontinued vancomycin on 10/15, continue zosyn for pseudomonas coverage  Renal function remains stable, Scr 0.66 with estimated CrCl 28 ml/min   WBC WNL, AF  Goal of Therapy:  Zosyn per renal function   Plan:  1.) Continue zosyn 3.375 grams IV q8h, infuse over 4  hours  2.) Monitor renal function  3.) f/u BAL final culture results   Payne Garske, Loma Messing PharmD Pager #: [phone] 3:02 PM 09/22/2013

## 2013-09-22 NOTE — Progress Notes (Signed)
Patient ID: Leslie [name], female   DOB: 06/06/1929, 79 y.o.   MRN: [id number] Pt presented to [place] dept today for US guided left thoracentesis. Limited US post left chest reveals no significant effusion. Images were reviewed by Dr. Miles Costain. Procedure was cancelled. Pt informed.

## 2013-09-23 ENCOUNTER — Inpatient Hospital Stay (HOSPITAL_COMMUNITY): Payer: Medicare Other

## 2013-09-23 DIAGNOSIS — J151 Pneumonia due to Pseudomonas: Principal | ICD-10-CM

## 2013-09-23 LAB — COMPREHENSIVE METABOLIC PANEL
ALT: 18 U/L (ref 0–35)
Albumin: 2.4 g/dL — ABNORMAL LOW (ref 3.5–5.2)
CO2: 32 mEq/L (ref 19–32)
Calcium: 9 mg/dL (ref 8.4–10.5)
Chloride: 105 mEq/L (ref 96–112)
Creatinine, Ser: 0.7 mg/dL (ref 0.50–1.10)
GFR calc Af Amer: >90 mL/min (ref >90)
GFR calc non Af Amer: 79 mL/min — ABNORMAL LOW (ref >90)
Glucose, Bld: 87 mg/dL (ref 70–99)
Sodium: 144 mEq/L (ref 135–145)

## 2013-09-23 LAB — CBC WITH DIFFERENTIAL/PLATELET
Basophils Absolute: 0 10*3/uL (ref 0.0–0.1)
Basophils Relative: 0 % (ref 0–1)
Hemoglobin: 10.5 g/dL — ABNORMAL LOW (ref 12.0–15.0)
Lymphocytes Relative: 18 % (ref 12–46)
Lymphs Abs: 2.1 10*3/uL (ref 0.7–4.0)
MCHC: 32.6 g/dL (ref 30.0–36.0)
Monocytes Absolute: 1 10*3/uL (ref 0.1–1.0)
Monocytes Relative: 8 % (ref 3–12)
Neutro Abs: 8.4 10*3/uL — ABNORMAL HIGH (ref 1.7–7.7)
Neutrophils Relative %: 73 % (ref 43–77)
RDW: 16.8 % — ABNORMAL HIGH (ref 11.5–15.5)
WBC: 11.5 10*3/uL — ABNORMAL HIGH (ref 4.0–10.5)

## 2013-09-23 LAB — CULTURE, BAL-QUANTITATIVE

## 2013-09-23 LAB — CULTURE, BAL-QUANTITATIVE W GRAM STAIN

## 2013-09-23 MED ORDER — BACID PO TABS
2.0000 | ORAL_TABLET | Freq: Three times a day (TID) | ORAL | Status: DC
  Filled 2013-09-23 (×2): qty 2

## 2013-09-23 MED ORDER — LACTINEX PO CHEW
2.0000 | CHEWABLE_TABLET | Freq: Three times a day (TID) | ORAL | Status: DC
  Administered 2013-09-23 – 2013-09-26 (×8): 2 via ORAL
  Filled 2013-09-23 (×13): qty 2

## 2013-09-23 MED ORDER — SERTRALINE HCL 50 MG PO TABS
150.0000 mg | ORAL_TABLET | Freq: Every day | ORAL | Status: DC
  Administered 2013-09-24 – 2013-09-26 (×3): 150 mg via ORAL
  Filled 2013-09-23 (×3): qty 1

## 2013-09-23 MED ORDER — LEVOFLOXACIN 750 MG PO TABS
750.0000 mg | ORAL_TABLET | ORAL | Status: DC
  Administered 2013-09-23 – 2013-09-25 (×2): 750 mg via ORAL
  Filled 2013-09-23 (×2): qty 1

## 2013-09-23 NOTE — Progress Notes (Signed)
CSW met with patient and daughter at bedside. Daughter is requesting [facility] for snf upon discharge.  Aidden Markovic C. Renatha Rosen MSW, LCSW [phone]

## 2013-09-23 NOTE — Progress Notes (Signed)
MRI L shoulder ordered by physician in error on 09-22-13.  MRI L shoulder performed with no issues.  Pt and family notified of error.  Family requests MRI not be charged to patient account.  MRI notified to remove/cancel charge.  MD [facility] aware of error.  Will f/u with ordering MD to notify of error.  No further issues or concerns per pt or family at this time.

## 2013-09-23 NOTE — Progress Notes (Signed)
BRIEF PATIENT DESCRIPTION:  79 yo female with MHx Breast cancer, COPD, squamous cell lung cancer, etoh abuse presented to [facility] 10/8 with altered mental status. Had a fall from toilet to floor earlier that day. Found to have LLL PNA and ?bilateral effusions and was admitted. PCCM asked to consult for possible recurrence or metastasis of lung cancer or breast cancer, respectively.   SIGNIFICANT EVENTS / STUDIES:  10/8 chest xray >>> Left lower lobe pneumonia, ? small associated parapneumonic effusion, s/p LLL lobectomy  10/8 CT head >>> no acute findings  10/9 CT chest w/contrast >>> consolidation in LL, endobronch material and occlusion of main bronchus, small bilateral pleural effusions, dilated ascending aorta, enlarged main pulmonary artery  10-13 FOB with brushings and bal. 10-16 [place] not enough to tap per IR LINES / TUBES:  PIV x 1   CULTURES:  10/8 UA >>>negative  10/9 Blood Cx >>> ng 10/9 Urine strep/legionella antibody >>> negative  10-13 bal>>nos>>PSEUDOMONAS AERUGINOSA>>pan ss  10-13 brushings>>no malignant cells  ANTIBIOTICS:  Ceftriaxone 10/8 >>> off Azithromycin 10/8 >>>off 10-13 vanc>>10/15 10-10 zoysn>>   Subjective: Sitting up in bed, nad @rest . Appears frail. No CP, dyspnea, very HOH  Objective: Vital signs in last 24 hours: Blood pressure 151/84, pulse 83, temperature 98.4 F (36.9 C), temperature source Oral, resp. rate 18, height 4' 11.84" (1.52 m), weight 72 lb 8.5 oz (32.9 kg), SpO2 98.00%.  Intake/Output from previous day: 10/16 0701 - 10/17 0700 In: 250 [P.O.:250] Out: -    Physical Exam:   thin female in nad. Very Hard of hearing Nose without purulence or d/c noted, OP clear, neck without LN or TMG Chest with decreased bs, a few crackles and rhonchi, no wheezing.  CVs- s1s2 nml Abd -soft, non tender Ext - no edema  Lab Results:  Recent Labs  09/21/13 0645 09/22/13 0515 09/23/13 0555  WBC 9.7 10.2 11.5*  HGB 9.3* 9.8* 10.5*  HCT 28.8*  30.2* 32.2*  PLT 401* 477* 523*   BMET  Recent Labs  09/21/13 0645 09/22/13 0515 09/23/13 0555  NA 143 143 144  K 3.6 3.3* 3.9  CL 107 106 105  CO2 29 29 32  GLUCOSE 85 89 87  BUN 3* 6 5*  CREATININE 0.62 0.66 0.70  CALCIUM 8.7 8.7 9.0    Studies/Results: [place] Chest  09/22/2013   *RADIOLOGY REPORT*  Clinical Data: Pneumonia, left parapneumonic effusion  CHEST ULTRASOUND  Comparison: 09/21/2013  Findings: Limited ultrasound performed of the left chest with the patient upright.  Dense consolidation present in the left lower lobe.  Trace left pleural effusion.  Amount of fluid visualized by ultrasound is not enough to warrant the risk of thoracentesis. Therefore, thoracentesis not performed.  IMPRESSION: Trace left effusion by ultrasound.  Thoracentesis not performed.   Original Report Authenticated By: Judie Petit. Miles Costain, M.D.    Assessment/Plan:  1) acute on chronic respiratory failure /copd/ LLL pna.  clinically stable with no fever or increased wob.  S/p LLL obectomy 1999 ([place]) No endobronchial lesions on bronchoscopy  -continue aggressive BD regimen -psuedomonas >>dc zosyn & can dc on cipro  -For thora 10-16 @10  am, declined per IR lack of fluid -Not much fluid to tap on [place] chest Plan for SNF on discharge DNR was reversed for bscopy, should be reinstated on discharge  PCCM to sign off [place] with Dr [name] 1 week after discharge - would treat with at least 3 wks of antibiotics & reassess for radiographic resolution   Cyril Mourning  MD. FCCP. [facility] Pager [phone] If no response call [phone]   09/23/2013, 10:37 AM

## 2013-09-23 NOTE — Progress Notes (Signed)
NUTRITION FOLLOW UP  Intervention:   - Encouraged increased PO and supplement intake - Will continue to monitor   Nutrition Dx:   Increased nutrient needs related to malnutrition, cachexia, pnuemonia as evidenced by nutrition focused physical exam and MD notes - ongoing  Goal:   Pt to consume >90% of meals/supplements - not met consistently  Monitor:   Weights, labs, intake  Assessment:   Pt admitted with confusion, has hx of breast CA, COPD, GERD, IBS, osteoporosis, and alcoholism. Pt cachetic and weighs 72 pounds with pt report of 20 pound unintended weight loss in the past 6 months. On Megace for the past year.   Pt getting daily Megace. PO intake the past few days has varied from 25-100% of meals. Met with pt who was drinking Boost Plus this morning and says she has been eating better. No family in the room.   Height: Ht Readings from Last 1 Encounters:  09/16/13 4' 11.84" (1.52 m)    Weight Status:   Wt Readings from Last 1 Encounters:  09/16/13 72 lb 8.5 oz (32.9 kg)    Re-estimated needs:  Kcal: 1150-1350  Protein: 50-70g  Fluid: 1.1-1.3L/day   Skin: Intact    Diet Order: General   Intake/Output Summary (Last 24 hours) at 09/23/13 1016 Last data filed at 09/23/13 0836  Gross per 24 hour  Intake 1274.71 ml  Output      0 ml  Net 1274.71 ml    Last BM: 10/16   Labs:   Recent Labs Lab 09/16/13 1832  09/21/13 0645 09/22/13 0515 09/23/13 0555  NA  --   < > 143 143 144  K  --   < > 3.6 3.3* 3.9  CL  --   < > 107 106 105  CO2  --   < > 29 29 32  BUN  --   < > 3* 6 5*  CREATININE  --   < > 0.62 0.66 0.70  CALCIUM  --   < > 8.7 8.7 9.0  MG  --   < > 1.6 1.8 2.0  PHOS 2.3  --   --   --   --   GLUCOSE  --   < > 85 89 87  < > = values in this interval not displayed.  CBG (last 3)  No results found for this basename: GLUCAP,  in the last 72 hours  Scheduled Meds: . albuterol  2.5 mg Nebulization TID  . dextromethorphan-guaiFENesin  1 tablet Oral  BID  . dicyclomine  20 mg Oral TID AC  . enoxaparin (LOVENOX) injection  30 mg Subcutaneous Q24H  . feeding supplement (ENSURE COMPLETE)  237 mL Oral TID BM  . gabapentin  300 mg Oral BID  . magnesium oxide  200 mg Oral Daily  . megestrol  800 mg Oral Daily  . mirtazapine  15 mg Oral QHS  . mometasone-formoterol  2 puff Inhalation BID  . pantoprazole  20 mg Oral Daily  . piperacillin-tazobactam (ZOSYN)  IV  3.375 g Intravenous Q8H  . potassium chloride  20 mEq Oral Daily  . saccharomyces boulardii  250 mg Oral BID  . sertraline  100 mg Oral Daily    Continuous Infusions: . sodium chloride 50 mL/hr at 09/22/13 0838    Levon Hedger MS, RD, LDN [phone] Pager [phone] After Hours Pager

## 2013-09-23 NOTE — Progress Notes (Signed)
CSW followed up with patient's daughter, discussed if she had made a choice. She states that she may decide to take patient home. Would like to speak with the case manager.  Samika Vetsch C. Shanterica Biehler MSW, LCSW [phone]

## 2013-09-23 NOTE — Progress Notes (Signed)
[facility] PROGRESS NOTE  Leslie [name] ZOX:[id number] DOB: 1929-11-16 DOA: 09/14/2013 PCP: [name], MD  Assessment/Plan: LLL pneumonia HCAP Pseudomonas Aeruginosa; sensitive to Levofloxacin. Will narrow today. -Continue patient on albuterol nebulizers,  Dulera, obtain RR/SpO2 vitals every 4 hours -Continue Mucinex -continue pulmonary toilet - will likely need at least 3 weeks of antibiotics and the close follow up with her PCP to determine need for a longer course. Appreciate Pulmonology input. She will need close follow up with Dr. [name].  COPD; continue O2 to maintain SPO2> 92% -Continue patient's albuterol nebulizer -Continue Dulera -RR/SpO2 vitals every 4 hours Anxiety; Continue Chlordiazepoxide (home medication) Irritable bowel syndrome; Continue Bentyl (home medication) Depression;  Continue Remeron + Zoloft (home regimen) Malnutrition; restart Megace 800 mg daily -Per daughter she is eating better  Altered mental status; resolved, patient alert and oriented.  Chronic pain; continue home medication Hypokalemia; will replete  Code Status: DNR Family Communication: Discussed plan of care with both daughters and granddaughter. Bridget Hartshorn has medical power of attorney [phone] Disposition Plan:   Consultants:  Pulmonology  Procedures: CXR post bronchoscopy 09/19/2013 Official read COPD changes with unchanged appearance of the left lung and slightly  increased opacity at the right base which could represent  atelectasis or infiltrate.  BAL from 09/19/2013 AFB; negative to date BAL LUL; pseudomonas aeruginosa sensitive to Levofloxacin BAL LUL fungal smear/culture; negative to date  CXR 09/19/2013 Advanced changes of COPD/ emphysema noted. There is progressive  worsening aeration to the left lung with possible increase in volume  of left pleural effusion. Right lung appears clear.   CXR 09/15/2013 Interval development of patchy airspace disease  throughout the left  lower lobe concerning for pneumonia. - Possible small parapneumonic effusion. - left upper lobectomy with volume loss in the left hemi thorax with right-to-left shift of the cardiac and mediastinal contours.  -Pulmonary hyperexpansion, emphysema and chronic bronchitic changes remain stable.   CT head without contrast 09/15/2013 No acute intracranial abnormality. Stable atrophy and chronic microvascular ischemic white matter Changes.  CT chest with contrast 09/15/2013;  1. Consolidative opacity within the remaining left lung.  Additionally there is endobronchial material and occlusion of the  left mainstem bronchus. Considerations for the findings within the  left lung include pneumonia, postobstructive pneumonitis, aspiration  or recurrent malignancy. Considering the endobronchial material  within the left mainstem bronchus, correlation with bronchoscopy  should be considered to exclude endobronchial mass.  2. Small bilateral pleural effusions.  3. Dilation of the ascending thoracic aorta.  4. Enlargement of the main pulmonary artery as can be seen with  pulmonary arterial hypertension.  Echocardiogram;   - Left ventricle: The cavity size was normal. Wall thickness was normal. Systolic function was normal.  -LVEF=  60% to 65%.  -(grade 1 diastolicdysfunction). - Aortic valve: Mild regurgitation. - Mitral valve: There is a mild prolapse of the posterior leaflet of the mitral valve without any associated functional abnormality. - Atrial septum: No defect or patent foramen ovale was identified. - Pericardium, extracardiac: A trivial pericardial effusion was identified.  Antibiotics:  Azithromycin 10/10>>>D/Ced 10/10   Ceftriaxone 10/9>>D/Ced 10/10   Zosyn 10/10 >> 10/17  Vancomycin 10/10 >> 10/17  Levofloxacin 10/17 >>  HPI/Subjective: 79 yo WF PMHx IBS, Osteoporosis, squamous cell lung cancer (s/p left thoracotomy w/ LLLobectomy for squamous cell ca  10/99 by Dr Edwyna Shell... no known recurrence)., adenocarcinoma of the breast (left breast lumpectomy and sentinel node biopsy 12/03 by Dr Ezzard Standing for a 2.2cm infiltrating carcinoma, neg LN's,  ER/PR pos, treated w/ XRT, then Tamoxifen for 56yrs), COPD, alcoholism, anxiety, anemia, lives at home with son comes in with worsening confusion today, no fevers. Daughter with her now. No n/v/d. No rashes. No cough. No sob. No complaints of pain. She was just more confused than normal. Fairly independent at home. Has not complaints right now in ED. cxr shows pna. No recent abx. 09/15/2013 states feels mildly SOB while on 2 L 02 via [place]. States does not use O2 at home. Negative CP. 09/16/2013 patient confused today however pleasant. Follows commands but answers questions inappropriately. 09/17/2013 patient still mildly confused however very cooperative, decreased WOB, however still becomes SOB when off her O2. 09/18/2013 patient pleasantly confused, however does recognize family members in the room. 09/19/2013 patient continues to be pleasantly confused, currently sitting in bed finishing her breakfast. Still requiring 2 L O2 via nasal cannula while sedentary. 09/20/2013 patient much better spirits, joking with family and medical staff. The WOB decreased. Per family patient ate approximately half of her lunch (coleslaw ice cream and a bite or 2 of barbecue). 09/21/2013 patient aware of pending thoracentesis, states has been working with PT during the day but still extremely weak and only able to take a few steps 10/16 is resting comfortably. 10/17 no complaints, comfortable  Objective: Filed Vitals:   09/22/13 2045 09/22/13 2200 09/23/13 0631 09/23/13 0730  BP:  132/67 172/93 151/84  Pulse:  64 83   Temp:  98.4 F (36.9 C) 98.4 F (36.9 C)   TempSrc:  Oral Oral   Resp:  18 18   Height:      Weight:      SpO2: 97% 100% 93% 98%    Intake/Output Summary (Last 24 hours) at 09/23/13 1121 Last data filed at 09/23/13  0836  Gross per 24 hour  Intake 1274.71 ml  Output      0 ml  Net 1274.71 ml   Filed Weights   09/16/13 1700  Weight: 32.9 kg (72 lb 8.5 oz)    Exam:  General: NAD,   Cardiovascular:, Regular rhythm and rate , negative murmurs rubs or gallops, DP/PT pulse 2+ bilateral   Respiratory: The auscultation bilateral   Abdomen: Soft, nontender, nondistended plus bowel sounds  Musculoskeletal: Very cachectic female, negative pedal edema   Data Reviewed: Basic Metabolic Panel:  Recent Labs Lab 09/16/13 1832  09/19/13 0615 09/20/13 0530 09/21/13 0645 09/22/13 0515 09/23/13 0555  NA  --   < > 142 145 143 143 144  K  --   < > 3.5 3.0* 3.6 3.3* 3.9  CL  --   < > 112 112 107 106 105  CO2  --   < > 26 26 29 29  32  GLUCOSE  --   < > 95 98 85 89 87  BUN  --   < > 3* 4* 3* 6 5*  CREATININE  --   < > 0.64 0.68 0.62 0.66 0.70  CALCIUM  --   < > 8.2* 8.6 8.7 8.7 9.0  MG  --   < > 1.8 1.8 1.6 1.8 2.0  PHOS 2.3  --   --   --   --   --   --   < > = values in this interval not displayed. Liver Function Tests:  Recent Labs Lab 09/19/13 0615 09/20/13 0530 09/21/13 0645 09/22/13 0515 09/23/13 0555  AST 15 10 13 15 27   ALT 13 10 10 10 18   ALKPHOS 47 46 42  48 52  BILITOT 0.2* 0.2* <0.1* <0.1* 0.2*  PROT 5.1* 5.0* 4.9* 5.3* 5.8*  ALBUMIN 2.0* 2.0* 1.9* 2.1* 2.4*   No results found for this basename: LIPASE, AMYLASE,  in the last 168 hours No results found for this basename: AMMONIA,  in the last 168 hours CBC:  Recent Labs Lab 09/19/13 0615 09/20/13 0530 09/21/13 0645 09/22/13 0515 09/23/13 0555  WBC 9.7 10.2 9.7 10.2 11.5*  NEUTROABS 6.7 7.1 6.6 7.1 8.4*  HGB 9.5* 9.7* 9.3* 9.8* 10.5*  HCT 29.1* 29.5* 28.8* 30.2* 32.2*  MCV 90.1 90.2 90.3 89.9 89.4  PLT 363 425* 401* 477* 523*   Cardiac Enzymes:  Recent Labs Lab 09/16/13 1832  TROPONINI <0.30   BNP (last 3 results)  Recent Labs  09/16/13 1832  PROBNP 3115.0*    Recent Results (from the past 240 hour(s))   CULTURE, BLOOD (ROUTINE X 2)     Status: None   Collection Time    09/15/13  6:45 AM      Result Value Range Status   Specimen Description BLOOD LEFT ARM   Final   Special Requests BOTTLES DRAWN AEROBIC ONLY 1CC   Final   Culture  Setup Time     Final   Value: 09/15/2013 10:29     Performed at [facility]   Culture     Final   Value: NO GROWTH 5 DAYS     Performed at [facility]   Report Status 09/21/2013 FINAL   Final  CULTURE, BLOOD (ROUTINE X 2)     Status: None   Collection Time    09/15/13  6:55 AM      Result Value Range Status   Specimen Description BLOOD LEFT HAND   Final   Special Requests     Final   Value: BOTTLES DRAWN AEROBIC AND ANAEROBIC 3CC AER 2CC ANA   Culture  Setup Time     Final   Value: 09/15/2013 10:29     Performed at [facility]   Culture     Final   Value: NO GROWTH 5 DAYS     Performed at [facility]   Report Status 09/21/2013 FINAL   Final  MRSA PCR SCREENING     Status: None   Collection Time    09/16/13  6:31 PM      Result Value Range Status   MRSA by PCR NEGATIVE  NEGATIVE Final   Comment:            The GeneXpert MRSA Assay (FDA     approved for NASAL specimens     only), is one component of a     comprehensive MRSA colonization     surveillance program. It is not     intended to diagnose MRSA     infection nor to guide or     monitor treatment for     MRSA infections.  AFB CULTURE WITH SMEAR     Status: None   Collection Time    09/19/13  3:04 PM      Result Value Range Status   Specimen Description BRONCHIAL ALVEOLAR LAVAGE LUL   Final   Special Requests NONE   Final   ACID FAST SMEAR     Final   Value: NO ACID FAST BACILLI SEEN     Performed at [facility]   Culture     Final   Value: CULTURE WILL BE EXAMINED FOR 6 WEEKS BEFORE ISSUING  A FINAL REPORT     Performed at [facility]   Report Status PENDING   Incomplete  CULTURE, BAL-QUANTITATIVE     Status: None   Collection  Time    09/19/13  3:04 PM      Result Value Range Status   Specimen Description BRONCHIAL ALVEOLAR LAVAGE LUL   Final   Special Requests NONE   Final   Gram Stain     Final   Value: MODERATE WBC PRESENT, PREDOMINANTLY PMN     NO SQUAMOUS EPITHELIAL CELLS SEEN     NO ORGANISMS SEEN     Performed at [facility] Count     Final   Value: 200 COLONIES/ML     Performed at [facility]   Culture     Final   Value: PSEUDOMONAS AERUGINOSA     Performed at [facility]   Report Status 09/23/2013 FINAL   Final   Organism ID, Bacteria PSEUDOMONAS AERUGINOSA   Final  FUNGUS CULTURE W SMEAR     Status: None   Collection Time    09/19/13  3:04 PM      Result Value Range Status   Specimen Description BRONCHIAL ALVEOLAR LAVAGE LUL   Final   Special Requests NONE   Final   Fungal Smear     Final   Value: NO YEAST OR FUNGAL ELEMENTS SEEN     Performed at [facility]   Culture     Final   Value: CULTURE IN PROGRESS FOR FOUR WEEKS     Performed at [facility]   Report Status PENDING   Incomplete    Studies: [place] Chest  09/22/2013   *RADIOLOGY REPORT*  Clinical Data: Pneumonia, left parapneumonic effusion  CHEST ULTRASOUND  Comparison: 09/21/2013  Findings: Limited ultrasound performed of the left chest with the patient upright.  Dense consolidation present in the left lower lobe.  Trace left pleural effusion.  Amount of fluid visualized by ultrasound is not enough to warrant the risk of thoracentesis. Therefore, thoracentesis not performed.  IMPRESSION: Trace left effusion by ultrasound.  Thoracentesis not performed.   Original Report Authenticated By: Judie Petit. Shick, M.D.    Scheduled Meds: . albuterol  2.5 mg Nebulization TID  . dextromethorphan-guaiFENesin  1 tablet Oral BID  . dicyclomine  20 mg Oral TID AC  . enoxaparin (LOVENOX) injection  30 mg Subcutaneous Q24H  . feeding supplement (ENSURE COMPLETE)  237 mL Oral TID BM  . gabapentin  300 mg  Oral BID  . magnesium oxide  200 mg Oral Daily  . megestrol  800 mg Oral Daily  . mirtazapine  15 mg Oral QHS  . mometasone-formoterol  2 puff Inhalation BID  . pantoprazole  20 mg Oral Daily  . piperacillin-tazobactam (ZOSYN)  IV  3.375 g Intravenous Q8H  . potassium chloride  20 mEq Oral Daily  . saccharomyces boulardii  250 mg Oral BID  . sertraline  100 mg Oral Daily   Continuous Infusions: . sodium chloride 50 mL/hr at 09/22/13 1610   Principal Problem:   CAP (community acquired pneumonia) Active Problems:   CARCINOMA, LUNG, SQUAMOUS CELL   ADENOCARCINOMA, BREAST   ANXIETY   ALCOHOLISM   HEARING LOSS   COPD   PSORIASIS   Memory loss   Acute respiratory failure with hypoxia   Altered mental status   Chronic pain syndrome   Parapneumonic effusion   Protein-calorie malnutrition, severe   Hypokalemia  Depression  Time spent: 35 minutes  Pamella Pert  [facility] Pager [phone]. If 7PM-7AM, please contact night-coverage at [email or web address], password [facility] 09/23/2013, 11:21 AM  LOS: 9 days

## 2013-09-24 LAB — COMPREHENSIVE METABOLIC PANEL
ALT: 17 U/L (ref 0–35)
Albumin: 2.2 g/dL — ABNORMAL LOW (ref 3.5–5.2)
Alkaline Phosphatase: 50 U/L (ref 39–117)
CO2: 30 mEq/L (ref 19–32)
Potassium: 4.1 mEq/L (ref 3.5–5.1)
Sodium: 140 mEq/L (ref 135–145)
Total Bilirubin: <0.1 mg/dL — ABNORMAL LOW (ref 0.3–1.2)
Total Protein: 5.5 g/dL — ABNORMAL LOW (ref 6.0–8.3)

## 2013-09-24 LAB — CBC WITH DIFFERENTIAL/PLATELET
Basophils Absolute: 0 10*3/uL (ref 0.0–0.1)
Basophils Relative: 0 % (ref 0–1)
Eosinophils Relative: 1 % (ref 0–5)
HCT: 30 % — ABNORMAL LOW (ref 36.0–46.0)
Lymphocytes Relative: 16 % (ref 12–46)
MCHC: 32.3 g/dL (ref 30.0–36.0)
MCV: 90.4 fL (ref 78.0–100.0)
Monocytes Absolute: 0.8 10*3/uL (ref 0.1–1.0)
Monocytes Relative: 7 % (ref 3–12)
RBC: 3.32 MIL/uL — ABNORMAL LOW (ref 3.87–5.11)
RDW: 16.8 % — ABNORMAL HIGH (ref 11.5–15.5)

## 2013-09-24 LAB — MAGNESIUM: Magnesium: 2.1 mg/dL (ref 1.5–2.5)

## 2013-09-24 NOTE — Progress Notes (Signed)
Per MD, Pt's daughters have decided that they'd like for Pt to go to [facility].  Spoke with Morrie Sheldon, supervisor at [employer].  Per Morrie Sheldon, admission has be be arranged by [facility] prior to the weekend.  LM for [employer].  Spoke with Pt's daughters.  Pt's HCPOA, Mrs. Tawanna Cooler, stated that they have decided to go to [facility] due to Pt's familiarity there.  They are able to pay off Pt's balance, should the facility be able to accommodate Pt.  Mrs. Tawanna Cooler stated that she has put in a call to the facility and that someone is supposed to be getting back to her.  Notified Pt's daughters that CSW has done the same.  Notified MD.  CSW to continue to follow.  Providence Crosby, LCSWA Clinical Social Work [phone]

## 2013-09-24 NOTE — Progress Notes (Signed)
[facility] PROGRESS NOTE  Leslie [name] WUJ:[id number] DOB: [birth year] DOA: 09/14/2013 PCP: [name], MD  Assessment/Plan: LLL pneumonia HCAP Pseudomonas Aeruginosa; sensitive to Levofloxacin.  - initially on Zosyn, transitioned to Levofloxacin on 10/17. Clinically doing great today, on room air.  -Continue patient on albuterol nebulizers,  Dulera, obtain RR/SpO2 vitals every 4 hours -Continue Mucinex -continue pulmonary toilet - will likely need at least 3 weeks of antibiotics. She will need close follow up with Dr. [name] to determine need for a longer course.  COPD; -Continue patient's albuterol nebulizer -Continue Dulera -RR/SpO2 vitals every 4 hours Anxiety; Continue Chlordiazepoxide (home medication) Irritable bowel syndrome; Continue Bentyl (home medication) Depression;  Continue Remeron + Zoloft (home regimen) Malnutrition; restart Megace 800 mg daily -Per daughter she is eating better  Altered mental status; resolved, patient alert and oriented.  Chronic pain; continue home medication Hypokalemia; will replete  Code Status: DNR Family Communication: Discussed plan of care with both daughters and granddaughter. Bridget Hartshorn has medical power of attorney [phone] Disposition Plan: discharge to SNF once bed available.   Consultants:  Pulmonology  Procedures: CXR post bronchoscopy 09/19/2013 Official read COPD changes with unchanged appearance of the left lung and slightly  increased opacity at the right base which could represent  atelectasis or infiltrate.  BAL from 09/19/2013 AFB; negative to date BAL LUL; pseudomonas aeruginosa sensitive to Levofloxacin BAL LUL fungal smear/culture; negative to date  CXR 09/19/2013 Advanced changes of COPD/ emphysema noted. There is progressive  worsening aeration to the left lung with possible increase in volume  of left pleural effusion. Right lung appears clear.   CXR 09/15/2013 Interval development of  patchy airspace disease throughout the left  lower lobe concerning for pneumonia. - Possible small parapneumonic effusion. - left upper lobectomy with volume loss in the left hemi thorax with right-to-left shift of the cardiac and mediastinal contours.  -Pulmonary hyperexpansion, emphysema and chronic bronchitic changes remain stable.   CT head without contrast 09/15/2013 No acute intracranial abnormality. Stable atrophy and chronic microvascular ischemic white matter Changes.  CT chest with contrast 09/15/2013;  1. Consolidative opacity within the remaining left lung.  Additionally there is endobronchial material and occlusion of the  left mainstem bronchus. Considerations for the findings within the  left lung include pneumonia, postobstructive pneumonitis, aspiration  or recurrent malignancy. Considering the endobronchial material  within the left mainstem bronchus, correlation with bronchoscopy  should be considered to exclude endobronchial mass.  2. Small bilateral pleural effusions.  3. Dilation of the ascending thoracic aorta.  4. Enlargement of the main pulmonary artery as can be seen with  pulmonary arterial hypertension.  Echocardiogram;   - Left ventricle: The cavity size was normal. Wall thickness was normal. Systolic function was normal.  -LVEF=  60% to 65%.  -(grade 1 diastolicdysfunction). - Aortic valve: Mild regurgitation. - Mitral valve: There is a mild prolapse of the posterior leaflet of the mitral valve without any associated functional abnormality. - Atrial septum: No defect or patent foramen ovale was identified. - Pericardium, extracardiac: A trivial pericardial effusion was identified.  Antibiotics:  Azithromycin 10/10>>>D/Ced 10/10   Ceftriaxone 10/9>>D/Ced 10/10   Zosyn 10/10 >> 10/17  Vancomycin 10/10 >> 10/17  Levofloxacin 10/17 >>  Subjective: - feeling well, no complaints, breathing comfortably.  Objective: Filed Vitals:   09/23/13 1939  09/23/13 2200 09/24/13 0600 09/24/13 0920  BP:  150/78 142/79   Pulse:  87 66   Temp:  98.6 F (37 C) 98.3 F (36.8 C)  TempSrc:  Oral Oral   Resp:  16 18   Height:      Weight:      SpO2: 93% 92% 96% 99%    Intake/Output Summary (Last 24 hours) at 09/24/13 1505 Last data filed at 09/24/13 1300  Gross per 24 hour  Intake 1575.83 ml  Output      0 ml  Net 1575.83 ml   Filed Weights   09/16/13 1700  Weight: 32.9 kg (72 lb 8.5 oz)    Exam:  General: NAD,   Cardiovascular:, Regular rhythm and rate , negative murmurs rubs or gallops, DP/PT pulse 2+ bilateral   Respiratory: The auscultation bilateral   Abdomen: Soft, nontender, nondistended plus bowel sounds  Musculoskeletal: Very cachectic female, negative pedal edema   Data Reviewed: Basic Metabolic Panel:  Recent Labs Lab 09/20/13 0530 09/21/13 0645 09/22/13 0515 09/23/13 0555 09/24/13 0523  NA 145 143 143 144 140  K 3.0* 3.6 3.3* 3.9 4.1  CL 112 107 106 105 103  CO2 26 29 29  32 30  GLUCOSE 98 85 89 87 91  BUN 4* 3* 6 5* 7  CREATININE 0.68 0.62 0.66 0.70 0.72  CALCIUM 8.6 8.7 8.7 9.0 8.6  MG 1.8 1.6 1.8 2.0 2.1   Liver Function Tests:  Recent Labs Lab 09/20/13 0530 09/21/13 0645 09/22/13 0515 09/23/13 0555 09/24/13 0523  AST 10 13 15 27 23   ALT 10 10 10 18 17   ALKPHOS 46 42 48 52 50  BILITOT 0.2* <0.1* <0.1* 0.2* <0.1*  PROT 5.0* 4.9* 5.3* 5.8* 5.5*  ALBUMIN 2.0* 1.9* 2.1* 2.4* 2.2*   CBC:  Recent Labs Lab 09/20/13 0530 09/21/13 0645 09/22/13 0515 09/23/13 0555 09/24/13 0523  WBC 10.2 9.7 10.2 11.5* 10.6*  NEUTROABS 7.1 6.6 7.1 8.4* 8.0*  HGB 9.7* 9.3* 9.8* 10.5* 9.7*  HCT 29.5* 28.8* 30.2* 32.2* 30.0*  MCV 90.2 90.3 89.9 89.4 90.4  PLT 425* 401* 477* 523* 467*   BNP (last 3 results)  Recent Labs  09/16/13 1832  PROBNP 3115.0*    Recent Results (from the past 240 hour(s))  CULTURE, BLOOD (ROUTINE X 2)     Status: None   Collection Time    09/15/13  6:45 AM      Result  Value Range Status   Specimen Description BLOOD LEFT ARM   Final   Special Requests BOTTLES DRAWN AEROBIC ONLY 1CC   Final   Culture  Setup Time     Final   Value: 09/15/2013 10:29     Performed at [facility]   Culture     Final   Value: NO GROWTH 5 DAYS     Performed at [facility]   Report Status 09/21/2013 FINAL   Final  CULTURE, BLOOD (ROUTINE X 2)     Status: None   Collection Time    09/15/13  6:55 AM      Result Value Range Status   Specimen Description BLOOD LEFT HAND   Final   Special Requests     Final   Value: BOTTLES DRAWN AEROBIC AND ANAEROBIC 3CC AER 2CC ANA   Culture  Setup Time     Final   Value: 09/15/2013 10:29     Performed at [facility]   Culture     Final   Value: NO GROWTH 5 DAYS     Performed at [facility]   Report Status 09/21/2013 FINAL   Final  MRSA PCR SCREENING  Status: None   Collection Time    09/16/13  6:31 PM      Result Value Range Status   MRSA by PCR NEGATIVE  NEGATIVE Final   Comment:            The GeneXpert MRSA Assay (FDA     approved for NASAL specimens     only), is one component of a     comprehensive MRSA colonization     surveillance program. It is not     intended to diagnose MRSA     infection nor to guide or     monitor treatment for     MRSA infections.  AFB CULTURE WITH SMEAR     Status: None   Collection Time    09/19/13  3:04 PM      Result Value Range Status   Specimen Description BRONCHIAL ALVEOLAR LAVAGE LUL   Final   Special Requests NONE   Final   ACID FAST SMEAR     Final   Value: NO ACID FAST BACILLI SEEN     Performed at [facility]   Culture     Final   Value: CULTURE WILL BE EXAMINED FOR 6 WEEKS BEFORE ISSUING A FINAL REPORT     Performed at [facility]   Report Status PENDING   Incomplete  CULTURE, BAL-QUANTITATIVE     Status: None   Collection Time    09/19/13  3:04 PM      Result Value Range Status   Specimen Description BRONCHIAL ALVEOLAR  LAVAGE LUL   Final   Special Requests NONE   Final   Gram Stain     Final   Value: MODERATE WBC PRESENT, PREDOMINANTLY PMN     NO SQUAMOUS EPITHELIAL CELLS SEEN     NO ORGANISMS SEEN     Performed at [facility] Count     Final   Value: 200 COLONIES/ML     Performed at [facility]   Culture     Final   Value: PSEUDOMONAS AERUGINOSA     Performed at [facility]   Report Status 09/23/2013 FINAL   Final   Organism ID, Bacteria PSEUDOMONAS AERUGINOSA   Final  FUNGUS CULTURE W SMEAR     Status: None   Collection Time    09/19/13  3:04 PM      Result Value Range Status   Specimen Description BRONCHIAL ALVEOLAR LAVAGE LUL   Final   Special Requests NONE   Final   Fungal Smear     Final   Value: NO YEAST OR FUNGAL ELEMENTS SEEN     Performed at [facility]   Culture     Final   Value: CULTURE IN PROGRESS FOR FOUR WEEKS     Performed at [facility]   Report Status PENDING   Incomplete    Studies: Mr Shoulder Left Wo Contrast  09/23/2013   CLINICAL DATA:  Left shoulder pain. Evaluate for infection versus gout.  EXAM: MRI OF THE LEFT SHOULDER WITHOUT CONTRAST  TECHNIQUE: Multiplanar, multisequence MR imaging of the shoulder was performed. No intravenous contrast was administered. The study is mildly motion degraded. Patient was unable to complete the examination.  COMPARISON:  Chest CT 09/15/2013.  FINDINGS: Rotator cuff: Mild supraspinatus and infraspinous tendinosis. No evidence of rotator cuff tear. The subscapularis and teres minor tendons appear normal.  Muscles: No focal muscular atrophy or edema. There is mild generalized  soft tissue edema surrounding the shoulder without focal fluid collection.  Biceps long head:  Intact and normally positioned.  Acromioclavicular Joint: The acromion is type 2. There are mild acromioclavicular degenerative changes. No significant fluid is present in the subacromial - subdeltoid bursa.  Glenohumeral  Joint: No significant shoulder joint effusion or glenohumeral arthropathy.  Labrum:  No evidence of labral tear.  Bones: No significant extra-articular osseous findings. No evidence of osteomyelitis.  Pleural parenchymal scarring in the left lung appears grossly unchanged from the prior chest CT. No chest wall lesions are identified.  IMPRESSION: 1. There is mild nonspecific soft tissue edema surrounding the left shoulder. No focal fluid collection or significant joint effusion is seen to suggest infection. There is no evidence of osteomyelitis. 2. Mild supraspinatus and infraspinous tendinosis. 3. The visualized portions of the left lung appear stable with pleural parenchymal scarring status post partial lung resection.   Electronically Signed   By: Roxy Horseman M.D.   On: 09/23/2013 13:32    Scheduled Meds: . albuterol  2.5 mg Nebulization TID  . dextromethorphan-guaiFENesin  1 tablet Oral BID  . dicyclomine  20 mg Oral TID AC  . enoxaparin (LOVENOX) injection  30 mg Subcutaneous Q24H  . feeding supplement (ENSURE COMPLETE)  237 mL Oral TID BM  . gabapentin  300 mg Oral BID  . lactobacillus acidophilus & bulgar  2 tablet Oral TID WC  . levofloxacin  750 mg Oral Q48H  . magnesium oxide  200 mg Oral Daily  . megestrol  800 mg Oral Daily  . mirtazapine  15 mg Oral QHS  . mometasone-formoterol  2 puff Inhalation BID  . pantoprazole  20 mg Oral Daily  . potassium chloride  20 mEq Oral Daily  . saccharomyces boulardii  250 mg Oral BID  . sertraline  150 mg Oral Daily   Continuous Infusions: . sodium chloride 50 mL/hr at 09/22/13 1610   Principal Problem:   CAP (community acquired pneumonia) Active Problems:   CARCINOMA, LUNG, SQUAMOUS CELL   ADENOCARCINOMA, BREAST   ANXIETY   ALCOHOLISM   HEARING LOSS   COPD   PSORIASIS   Memory loss   Acute respiratory failure with hypoxia   Altered mental status   Chronic pain syndrome   Parapneumonic effusion   Protein-calorie malnutrition,  severe   Hypokalemia   Depression   Pneumonia due to Pseudomonas  Time spent: 25 minutes  Pamella Pert  [facility] Pager [phone]. If 7PM-7AM, please contact night-coverage at [email or web address], password [facility] 09/24/2013, 3:05 PM  LOS: 10 days

## 2013-09-24 NOTE — Progress Notes (Signed)
Occupational Therapy Treatment Patient Details Name: LARCENIA HOLADAY MRN: [id number] DOB: 1929/06/22 Today's Date: 09/24/2013 Time: 3086-5784 OT Time Calculation (min): 24 min  OT Assessment / Plan / Recommendation  History of present illness pt was admitted with worsening confusion.  has pna. Pt had bronchoscopy 10/13 and is planned for possible thoracocentesis 10/16   OT comments  Pt tolerated up to [facility] but declined sitting up in chair due to back discomfort. States it feels better to lie flat. Pt planning SNF.   Follow Up Recommendations  SNF;Supervision/Assistance - 24 hour    Barriers to Discharge       Equipment Recommendations  3 in 1 bedside comode    Recommendations for Other Services    Frequency Min 2X/week   Progress towards OT Goals Progress towards OT goals: Progressing toward goals  Plan Discharge plan remains appropriate    Precautions / Restrictions Precautions Precautions: Fall Precaution Comments: monitor sats Restrictions Weight Bearing Restrictions: No   Pertinent Vitals/Pain See note below    ADL  Toilet Transfer: Performed;Minimal assistance Toilet Transfer Method: Stand pivot Toilet Transfer Equipment: Bedside commode Toileting - Clothing Manipulation and Hygiene: Performed;Minimal assistance Where Assessed - Toileting Clothing Manipulation and Hygiene: Sit to stand from 3-in-1 or toilet ADL Comments: Pt stating she didnt want to sit up in chair after toileting because the chair is uncomfortable and she has back pain issues. O2 off when OT arrived and sats were 90-93% supine in bed. Up to [facility] on RA and sats were 88-93%. Respiratory therapy came when OT finished for breathing treatment. Per nursing, pt has been on 1L. Respiratory placed pt  on 1L nasal cannula after breathing treatment.     OT Diagnosis:    OT Problem List:   OT Treatment Interventions:     OT Goals(current goals can now be found in the care plan section)    Visit  Information  Last OT Received On: 09/24/13 Assistance Needed: +1 History of Present Illness: pt was admitted with worsening confusion.  has pna. Pt had bronchoscopy 10/13 and is planned for possible thoracocentesis 10/16    Subjective Data      Prior Functioning       Cognition  Cognition Arousal/Alertness: Awake/alert Behavior During Therapy: WFL for tasks assessed/performed Overall Cognitive Status: Within Functional Limits for tasks assessed    Mobility  Bed Mobility Supine to Sit: 4: Min guard;HOB flat Sit to Supine: 4: Min guard;HOB flat Transfers Transfers: Sit to Stand;Stand to Sit Sit to Stand: 4: Min assist;With upper extremity assist;From bed Stand to Sit: 4: Min assist;To chair/3-in-1;With upper extremity assist    Exercises      Balance     End of Session OT - End of Session Activity Tolerance: Patient tolerated treatment well Patient left: in bed;with call bell/phone within reach  GO     [name] [phone] 09/24/2013, 3:29 PM

## 2013-09-25 LAB — CBC WITH DIFFERENTIAL/PLATELET
Basophils Absolute: 0 10*3/uL (ref 0.0–0.1)
Eosinophils Relative: 1 % (ref 0–5)
Hemoglobin: 10.7 g/dL — ABNORMAL LOW (ref 12.0–15.0)
Lymphocytes Relative: 19 % (ref 12–46)
Lymphs Abs: 2.1 10*3/uL (ref 0.7–4.0)
MCV: 89.9 fL (ref 78.0–100.0)
Monocytes Relative: 7 % (ref 3–12)
Neutro Abs: 8.3 10*3/uL — ABNORMAL HIGH (ref 1.7–7.7)
Neutrophils Relative %: 73 % (ref 43–77)
Platelets: 549 10*3/uL — ABNORMAL HIGH (ref 150–400)
RBC: 3.66 MIL/uL — ABNORMAL LOW (ref 3.87–5.11)
WBC: 11.4 10*3/uL — ABNORMAL HIGH (ref 4.0–10.5)

## 2013-09-25 LAB — COMPREHENSIVE METABOLIC PANEL
Albumin: 2.5 g/dL — ABNORMAL LOW (ref 3.5–5.2)
BUN: 8 mg/dL (ref 6–23)
Chloride: 107 mEq/L (ref 96–112)
Creatinine, Ser: 0.73 mg/dL (ref 0.50–1.10)
GFR calc Af Amer: >90 mL/min (ref >90)
Glucose, Bld: 98 mg/dL (ref 70–99)
Sodium: 143 mEq/L (ref 135–145)
Total Bilirubin: <0.1 mg/dL — ABNORMAL LOW (ref 0.3–1.2)
Total Protein: 5.8 g/dL — ABNORMAL LOW (ref 6.0–8.3)

## 2013-09-25 NOTE — Progress Notes (Signed)
[facility] PROGRESS NOTE  Leslie [name] FAO:[id number] DOB: 1929/06/01 DOA: 09/14/2013 PCP: [name], MD  Assessment/Plan: LLL pneumonia HCAP Pseudomonas Aeruginosa; microbiology shows that Pseudomonas is sensitive to Levofloxacin.  - she was initially on Zosyn, and once cultures were resulted her antibiotic was transitioned to Levofloxacin on 10/17. Clinically she is doing great, denies shortness of breath and is intermittently on 1L [place].  -Continue patient on albuterol nebulizers,  Dulera, obtain RR/SpO2 vitals every 4 hours -Continue Mucinex -continue pulmonary toilet - will likely need at least 3 weeks of antibiotics. She will need close follow up with Dr. [name] from Pulmonology to determine need for a longer course.  COPD; -Continue patient's albuterol nebulizer -Continue Dulera -RR/SpO2 vitals every 4 hours Anxiety; Continue Chlordiazepoxide (home medication) Irritable bowel syndrome; Continue Bentyl (home medication) Depression;  Continue Remeron + Zoloft (home regimen) Malnutrition; restart Megace 800 mg daily -Per daughter she is eating better  Altered mental status; likely due to #1, now resolved, patient alert and oriented.  Chronic pain; continue home medication Hypokalemia; will replete  Code Status: DNR Family Communication: Discussed plan of care with both daughters and granddaughter. Bridget Hartshorn has medical power of attorney [phone] Disposition Plan: discharge to SNF once bed available, hopefully Monday  Consultants:  Pulmonology  Procedures: CXR post bronchoscopy 09/19/2013 Official read COPD changes with unchanged appearance of the left lung and slightly  increased opacity at the right base which could represent  atelectasis or infiltrate.  BAL from 09/19/2013 AFB; negative to date BAL LUL; pseudomonas aeruginosa sensitive to Levofloxacin BAL LUL fungal smear/culture; negative to date  CXR 09/19/2013 Advanced changes of COPD/ emphysema  noted. There is progressive  worsening aeration to the left lung with possible increase in volume  of left pleural effusion. Right lung appears clear.   CXR 09/15/2013 Interval development of patchy airspace disease throughout the left  lower lobe concerning for pneumonia. - Possible small parapneumonic effusion. - left upper lobectomy with volume loss in the left hemi thorax with right-to-left shift of the cardiac and mediastinal contours.  -Pulmonary hyperexpansion, emphysema and chronic bronchitic changes remain stable.   CT head without contrast 09/15/2013 No acute intracranial abnormality. Stable atrophy and chronic microvascular ischemic white matter Changes.  CT chest with contrast 09/15/2013;  1. Consolidative opacity within the remaining left lung.  Additionally there is endobronchial material and occlusion of the  left mainstem bronchus. Considerations for the findings within the  left lung include pneumonia, postobstructive pneumonitis, aspiration  or recurrent malignancy. Considering the endobronchial material  within the left mainstem bronchus, correlation with bronchoscopy  should be considered to exclude endobronchial mass.  2. Small bilateral pleural effusions.  3. Dilation of the ascending thoracic aorta.  4. Enlargement of the main pulmonary artery as can be seen with  pulmonary arterial hypertension.  Echocardiogram;   - Left ventricle: The cavity size was normal. Wall thickness was normal. Systolic function was normal.  -LVEF=  60% to 65%.  -(grade 1 diastolicdysfunction). - Aortic valve: Mild regurgitation. - Mitral valve: There is a mild prolapse of the posterior leaflet of the mitral valve without any associated functional abnormality. - Atrial septum: No defect or patent foramen ovale was identified. - Pericardium, extracardiac: A trivial pericardial effusion was identified.  Antibiotics:  Azithromycin 10/10>>>D/Ced 10/10   Ceftriaxone 10/9>>D/Ced  10/10   Zosyn 10/10 >> 10/17  Vancomycin 10/10 >> 10/17  Levofloxacin 10/17 >>  Subjective: - feeling well, breathing "good"  Objective: Filed Vitals:   09/24/13 2111  09/24/13 2227 09/25/13 0549 09/25/13 0833  BP:  124/70 149/65   Pulse:  73 70   Temp:  98.4 F (36.9 C) 98.7 F (37.1 C)   TempSrc:  Oral Oral   Resp:  18 16   Height:      Weight:      SpO2: 94% 95% 97% 97%    Intake/Output Summary (Last 24 hours) at 09/25/13 1421 Last data filed at 09/25/13 0647  Gross per 24 hour  Intake    200 ml  Output   1200 ml  Net  -1000 ml   Filed Weights   09/16/13 1700  Weight: 32.9 kg (72 lb 8.5 oz)    Exam:  General: NAD,   Cardiovascular:, Regular rhythm and rate , negative murmurs rubs or gallops, DP/PT pulse 2+ bilateral   Respiratory: decreased breath sounds throughout, decent air movement.   Abdomen: Soft, nontender, nondistended plus bowel sounds  Musculoskeletal: Very cachectic female, negative pedal edema   Data Reviewed: Basic Metabolic Panel:  Recent Labs Lab 09/21/13 0645 09/22/13 0515 09/23/13 0555 09/24/13 0523 09/25/13 0435  NA 143 143 144 140 143  K 3.6 3.3* 3.9 4.1 4.2  CL 107 106 105 103 107  CO2 29 29 32 30 29  GLUCOSE 85 89 87 91 98  BUN 3* 6 5* 7 8  CREATININE 0.62 0.66 0.70 0.72 0.73  CALCIUM 8.7 8.7 9.0 8.6 9.0  MG 1.6 1.8 2.0 2.1 2.4   Liver Function Tests:  Recent Labs Lab 09/21/13 0645 09/22/13 0515 09/23/13 0555 09/24/13 0523 09/25/13 0435  AST 13 15 27 23 29   ALT 10 10 18 17 23   ALKPHOS 42 48 52 50 55  BILITOT <0.1* <0.1* 0.2* <0.1* <0.1*  PROT 4.9* 5.3* 5.8* 5.5* 5.8*  ALBUMIN 1.9* 2.1* 2.4* 2.2* 2.5*   CBC:  Recent Labs Lab 09/21/13 0645 09/22/13 0515 09/23/13 0555 09/24/13 0523 09/25/13 0435  WBC 9.7 10.2 11.5* 10.6* 11.4*  NEUTROABS 6.6 7.1 8.4* 8.0* 8.3*  HGB 9.3* 9.8* 10.5* 9.7* 10.7*  HCT 28.8* 30.2* 32.2* 30.0* 32.9*  MCV 90.3 89.9 89.4 90.4 89.9  PLT 401* 477* 523* 467* 549*   BNP (last  3 results)  Recent Labs  09/16/13 1832  PROBNP 3115.0*    Recent Results (from the past 240 hour(s))  MRSA PCR SCREENING     Status: None   Collection Time    09/16/13  6:31 PM      Result Value Range Status   MRSA by PCR NEGATIVE  NEGATIVE Final   Comment:            The GeneXpert MRSA Assay (FDA     approved for NASAL specimens     only), is one component of a     comprehensive MRSA colonization     surveillance program. It is not     intended to diagnose MRSA     infection nor to guide or     monitor treatment for     MRSA infections.  AFB CULTURE WITH SMEAR     Status: None   Collection Time    09/19/13  3:04 PM      Result Value Range Status   Specimen Description BRONCHIAL ALVEOLAR LAVAGE LUL   Final   Special Requests NONE   Final   ACID FAST SMEAR     Final   Value: NO ACID FAST BACILLI SEEN     Performed at [facility]  Final   Value: CULTURE WILL BE EXAMINED FOR 6 WEEKS BEFORE ISSUING A FINAL REPORT     Performed at [facility]   Report Status PENDING   Incomplete  CULTURE, BAL-QUANTITATIVE     Status: None   Collection Time    09/19/13  3:04 PM      Result Value Range Status   Specimen Description BRONCHIAL ALVEOLAR LAVAGE LUL   Final   Special Requests NONE   Final   Gram Stain     Final   Value: MODERATE WBC PRESENT, PREDOMINANTLY PMN     NO SQUAMOUS EPITHELIAL CELLS SEEN     NO ORGANISMS SEEN     Performed at [facility]     Final   Value: 200 COLONIES/ML     Performed at [facility]   Culture     Final   Value: PSEUDOMONAS AERUGINOSA     Performed at [facility]   Report Status 09/23/2013 FINAL   Final   Organism ID, Bacteria PSEUDOMONAS AERUGINOSA   Final  FUNGUS CULTURE W SMEAR     Status: None   Collection Time    09/19/13  3:04 PM      Result Value Range Status   Specimen Description BRONCHIAL ALVEOLAR LAVAGE LUL   Final   Special Requests NONE   Final   Fungal  Smear     Final   Value: NO YEAST OR FUNGAL ELEMENTS SEEN     Performed at [facility]   Culture     Final   Value: CULTURE IN PROGRESS FOR FOUR WEEKS     Performed at [facility]   Report Status PENDING   Incomplete    Studies: No results found.  Scheduled Meds: . albuterol  2.5 mg Nebulization TID  . dextromethorphan-guaiFENesin  1 tablet Oral BID  . dicyclomine  20 mg Oral TID AC  . enoxaparin (LOVENOX) injection  30 mg Subcutaneous Q24H  . feeding supplement (ENSURE COMPLETE)  237 mL Oral TID BM  . gabapentin  300 mg Oral BID  . lactobacillus acidophilus & bulgar  2 tablet Oral TID WC  . levofloxacin  750 mg Oral Q48H  . magnesium oxide  200 mg Oral Daily  . megestrol  800 mg Oral Daily  . mirtazapine  15 mg Oral QHS  . mometasone-formoterol  2 puff Inhalation BID  . pantoprazole  20 mg Oral Daily  . potassium chloride  20 mEq Oral Daily  . saccharomyces boulardii  250 mg Oral BID  . sertraline  150 mg Oral Daily   Continuous Infusions: . sodium chloride 50 mL/hr at 09/25/13 1225   Principal Problem:   CAP (community acquired pneumonia) Active Problems:   CARCINOMA, LUNG, SQUAMOUS CELL   ADENOCARCINOMA, BREAST   ANXIETY   ALCOHOLISM   HEARING LOSS   COPD   PSORIASIS   Memory loss   Acute respiratory failure with hypoxia   Altered mental status   Chronic pain syndrome   Parapneumonic effusion   Protein-calorie malnutrition, severe   Hypokalemia   Depression   Pneumonia due to Pseudomonas  Time spent: 25 minutes  Pamella Pert  [facility] Pager [phone]. If 7PM-7AM, please contact night-coverage at [email or web address], password [facility] 09/25/2013, 2:21 PM  LOS: 11 days

## 2013-09-25 NOTE — Progress Notes (Addendum)
Second message left for [facility], Admission Director at [employer].  LM for Pt's daughter updating her.  Providence Crosby, LCSWA Clinical Social Work [phone]

## 2013-09-26 LAB — BASIC METABOLIC PANEL
BUN: 7 mg/dL (ref 6–23)
CO2: 25 mEq/L (ref 19–32)
Chloride: 105 mEq/L (ref 96–112)
GFR calc Af Amer: >90 mL/min (ref >90)
Glucose, Bld: 93 mg/dL (ref 70–99)
Potassium: 4.2 mEq/L (ref 3.5–5.1)

## 2013-09-26 LAB — CBC
HCT: 30.3 % — ABNORMAL LOW (ref 36.0–46.0)
Hemoglobin: 9.7 g/dL — ABNORMAL LOW (ref 12.0–15.0)
RBC: 3.34 MIL/uL — ABNORMAL LOW (ref 3.87–5.11)
WBC: 10.2 10*3/uL (ref 4.0–10.5)

## 2013-09-26 LAB — MAGNESIUM: Magnesium: 2.2 mg/dL (ref 1.5–2.5)

## 2013-09-26 MED ORDER — ALBUTEROL SULFATE (5 MG/ML) 0.5% IN NEBU: 2.5000 mg | INHALATION_SOLUTION | Freq: Two times a day (BID) | RESPIRATORY_TRACT | Status: DC

## 2013-09-26 MED ORDER — LEVOFLOXACIN 750 MG PO TABS: 750.0000 mg | ORAL_TABLET | ORAL | Status: DC

## 2013-09-26 NOTE — Progress Notes (Signed)
Physical Therapy Treatment Patient Details Name: Leslie [name] MRN: [id number] DOB: February 24, 1929 Today's Date: 09/26/2013 Time: 4098-1191 PT Time Calculation (min): 14 min  PT Assessment / Plan / Recommendation  History of Present Illness pt was admitted with worsening confusion.  has pna. Pt had bronchoscopy 10/13 and is planned for possible thoracocentesis 10/16   PT Comments   Progressing slowly with mobility. Continues to require Min assist intermittently and pt still fatigues fairly easily. Could benefit from ST rehab if pt/daughter agreeable.   Follow Up Recommendations  Home health PT;SNF;Supervision/Assistance - 24 hour (depending on progress and daughters ability to provide care)     Does the patient have the potential to tolerate intense rehabilitation     Barriers to Discharge        Equipment Recommendations  None recommended by PT    Recommendations for Other Services    Frequency Min 3X/week   Progress towards PT Goals Progress towards PT goals: Progressing toward goals (slowly)  Plan Current plan remains appropriate    Precautions / Restrictions Precautions Precautions: Fall Restrictions Weight Bearing Restrictions: No   Pertinent Vitals/Pain Back pain-chronic "nothing new" unrated    Mobility  Bed Mobility Bed Mobility: Supine to Sit Supine to Sit: 5: Supervision;HOB elevated;With rails Details for Bed Mobility Assistance: Increased time.  Transfers Transfers: Sit to Stand;Stand to Sit Sit to Stand: 4: Min assist;From toilet;From bed Stand to Sit: 4: Min assist;To chair/3-in-1;To toilet Details for Transfer Assistance: Assist to rise, stabilize, control descent. Vcs safety, hand placement Ambulation/Gait Ambulation/Gait Assistance: 4: Min assist Ambulation Distance (Feet): 60 Feet Assistive device: Rolling walker Ambulation/Gait Assistance Details: Assist to stabilize and maneuver walker intermittently. Fatigues fairly easily.  Gait Pattern:  Step-through pattern;Decreased stride length;Trunk flexed    Exercises     PT Diagnosis:    PT Problem List:   PT Treatment Interventions:     PT Goals (current goals can now be found in the care plan section)    Visit Information  Last PT Received On: 09/26/13 Assistance Needed: +1 History of Present Illness: pt was admitted with worsening confusion.  has pna. Pt had bronchoscopy 10/13 and is planned for possible thoracocentesis 10/16    Subjective Data      Cognition  Cognition Arousal/Alertness: Awake/alert Behavior During Therapy: [facility] for tasks assessed/performed Overall Cognitive Status: Within Functional Limits for tasks assessed    Balance     End of Session PT - End of Session Activity Tolerance: Patient limited by fatigue Patient left: in chair;with call bell/phone within reach   GP     Rebeca Alert, MPT Pager: [phone]

## 2013-09-26 NOTE — Clinical Social Work Placement (Signed)
     Clinical Social Work Department CLINICAL SOCIAL WORK PLACEMENT NOTE 09/26/2013  Patient:  Leslie [name],Leslie [name]  Account Number:  [id number] Admit date:  09/14/2013  Clinical Social Worker:  Becky Sax, LCSW  Date/time:  09/26/2013 12:00 M  Clinical Social Work is seeking post-discharge placement for this patient at the following level of care:   SKILLED NURSING   (*CSW will update this form in Epic as items are completed)   09/26/2013  Patient/family provided with [facility] Department of Clinical Social Works list of facilities offering this level of care within the geographic area requested by the patient (or if unable, by the patients family).  09/26/2013  Patient/family informed of their freedom to choose among providers that offer the needed level of care, that participate in Medicare, Medicaid or managed care program needed by the patient, have an available bed and are willing to accept the patient.  09/26/2013  Patient/family informed of [facility] ownership interest in [facility], as well as of the fact that they are under no obligation to receive care at this facility.  PASARR submitted to EDS on 09/26/2013 PASARR number received from [employer] on 09/26/2013  FL2 transmitted to all facilities in geographic area requested by pt/family on  09/26/2013 FL2 transmitted to all facilities within larger geographic area on 09/26/2013  Patient informed that his/her managed care company has contracts with or will negotiate with  certain facilities, including the following:     Patient/family informed of bed offers received:  09/26/2013 Patient chooses bed at [facility], [place] Physician recommends and patient chooses bed at    [facility] [place] on  09/26/2013 Patient to be transferred to facility by ptar  The following physician request were entered in Epic:   Additional Comments:

## 2013-09-26 NOTE — Discharge Summary (Signed)
Physician Discharge Summary  Leslie [name] NFA:[id number] DOB: [birth year] DOA: 09/14/2013  PCP: [name], MD  Admit date: 09/14/2013 Discharge date: 09/26/2013  Time spent: >35 minutes  Recommendations for Outpatient Follow-up:  F/u with pulmonologist in 2-3 weeks F/u with PCP in 1 week post rehab  Discharge Diagnoses:  Principal Problem:   CAP (community acquired pneumonia) Active Problems:   CARCINOMA, LUNG, SQUAMOUS CELL   ADENOCARCINOMA, BREAST   ANXIETY   ALCOHOLISM   HEARING LOSS   COPD   PSORIASIS   Memory loss   Acute respiratory failure with hypoxia   Altered mental status   Chronic pain syndrome   Parapneumonic effusion   Protein-calorie malnutrition, severe   Hypokalemia   Depression   Pneumonia due to Pseudomonas   Discharge Condition: stable   Diet recommendation: heart healthy   Filed Weights   09/16/13 1700  Weight: 32.9 kg (72 lb 8.5 oz)    History of present illness:  79 yo female with MHx Breast cancer, COPD, squamous cell lung cancer, etoh abuse presented to [facility] 10/8 with altered mental status admitted with Pneumonia    Hospital Course:  1. LLL pneumonia HCAP Pseudomonas Aeruginosa; microbiology shows that Pseudomonas is sensitive to Levofloxacin.  - she was initially on Zosyn, and once cultures were resulted her antibiotic was transitioned to Levofloxacin on 10/17. Clinically she is doing great, denies shortness of breath and is intermittently on 1L [place].  -Continue patient on albuterol nebulizers, Dulera, obtain RR/SpO2 vitals every 4 hours  -cont PO antibiotics. She will need close follow up with Dr. [name] from Pulmonology to determine need for a longer course.  2. COPD; -Continue patient's albuterol nebulizer  -Continue Dulera RR/SpO2 vitals every 4 hours prn  3. Anxiety; Continue Chlordiazepoxide (home medication)  4. Irritable bowel syndrome; Continue Bentyl (home medication)  5. Depression; Continue Remeron + Zoloft (home regimen)   6. Malnutrition; restart Megace 800 mg daily  -Per daughter she is eating better  7. Altered mental status; likely due to #1, now resolved, patient alert and oriented.  8. Chronic pain; continue home medication    Consultants:  Pulmonology Procedures:  CXR post bronchoscopy 09/19/2013  Official read  COPD changes with unchanged appearance of the left lung and slightly  increased opacity at the right base which could represent  atelectasis or infiltrate.  BAL from 09/19/2013  AFB; negative to date  BAL LUL; pseudomonas aeruginosa sensitive to Levofloxacin  BAL LUL fungal smear/culture; negative to date  CXR 09/19/2013  Advanced changes of COPD/ emphysema noted. There is progressive  worsening aeration to the left lung with possible increase in volume  of left pleural effusion. Right lung appears clear.  CXR 09/15/2013 Interval development of patchy airspace disease throughout the left  lower lobe concerning for pneumonia.  - Possible small parapneumonic effusion.  - left upper lobectomy with volume loss in the left hemi thorax with right-to-left shift of the cardiac and mediastinal contours.  -Pulmonary hyperexpansion, emphysema and chronic bronchitic changes remain stable.  CT head without contrast 09/15/2013  No acute intracranial abnormality. Stable atrophy and chronic microvascular ischemic white matter Changes.  CT chest with contrast 09/15/2013;  1. Consolidative opacity within the remaining left lung.  Additionally there is endobronchial material and occlusion of the  left mainstem bronchus. Considerations for the findings within the  left lung include pneumonia, postobstructive pneumonitis, aspiration  or recurrent malignancy. Considering the endobronchial material  within the left mainstem bronchus, correlation with bronchoscopy  should be considered  to exclude endobronchial mass.  2. Small bilateral pleural effusions.  3. Dilation of the ascending thoracic aorta.  4.  Enlargement of the main pulmonary artery as can be seen with  pulmonary arterial hypertension.  Echocardiogram;  - Left ventricle: The cavity size was normal. Wall thickness was normal. Systolic function was normal.  -LVEF= 60% to 65%.  -(grade 1 diastolicdysfunction). - Aortic valve: Mild regurgitation. - Mitral valve: There is a mild prolapse of the posterior leaflet of the mitral valve without any associated functional abnormality. - Atrial septum: No defect or patent foramen ovale was identified. - Pericardium, extracardiac: A trivial pericardial effusion was identified.  Antibiotics:  Azithromycin 10/10>>>D/Ced 10/10  Ceftriaxone 10/9>>D/Ced 10/10  Zosyn 10/10 >> 10/17  Vancomycin 10/10 >> 10/17   Discharge Exam: Filed Vitals:   09/26/13 0536  BP: 127/67  Pulse: 69  Temp: 98.3 F (36.8 C)  Resp: 16    General: alert  Cardiovascular: s1,s2 rrr Respiratory: CTA BL   Discharge Instructions  Discharge Orders   Future Appointments Provider Department Dept Phone   10/19/2013 4:00 PM [name], MD [facility] [phone]   Future Orders Complete By Expires   Diet - low sodium heart healthy  As directed    Discharge instructions  As directed    Comments:     Follow up with pulmonologist in 2-3 weeks Follow up with PCP in 1 weeks post rehab   Increase activity slowly  As directed        Medication List         acetaminophen 325 MG tablet  Commonly known as:  TYLENOL  Take 650 mg by mouth every 6 (six) hours as needed for pain.     albuterol (2.5 MG/3ML) 0.083% nebulizer solution  Commonly known as:  PROVENTIL  - Use 1 vial in nebulizer up to 3 times daily as needed  - FILE UNDER MCR PART B     calcium carbonate 600 MG Tabs tablet  Commonly known as:  OS-CAL  Take 600 mg by mouth 2 (two) times daily with a meal.     CENTRUM SILVER PO  Take 1 tablet by mouth daily.     chlordiazePOXIDE 10 MG capsule  Commonly known as:  LIBRIUM  Take 1  capsule (10 mg total) by mouth 3 (three) times daily as needed for anxiety.     cholecalciferol 1000 UNITS tablet  Commonly known as:  VITAMIN D  Take 1,000 Units by mouth daily.     cyanocobalamin 2000 MCG tablet  Take 2,000 mcg by mouth daily.     dicyclomine 20 MG tablet  Commonly known as:  BENTYL  TAKE 1 TABLET BY MOUTH 3 TIMES A DAY AS NEEDED FOR CRAMPING     Fluticasone-Salmeterol 250-50 MCG/DOSE Aepb  Commonly known as:  ADVAIR DISKUS  INHALE 1 PUFF TWICE A DAY     gabapentin 300 MG capsule  Commonly known as:  NEURONTIN  TAKE ONE CAPSULE BY MOUTH 3 TIMES A DAY     ibuprofen 400 MG tablet  Commonly known as:  ADVIL,MOTRIN  Take 400 mg by mouth every 6 (six) hours as needed for pain.     lansoprazole 15 MG capsule  Commonly known as:  PREVACID  Take 1 capsule (15 mg total) by mouth 2 (two) times daily.     levofloxacin 750 MG tablet  Commonly known as:  LEVAQUIN  Take 1 tablet (750 mg total) by mouth every other day.  meclizine 25 MG tablet  Commonly known as:  ANTIVERT  Take 1/2 to 1 tablet by mouth every 4 hours as needed for dizziness     megestrol 40 MG/ML suspension  Commonly known as:  MEGACE ORAL  Take 1 teaspoonful by mouth four times daily     mirtazapine 15 MG tablet  Commonly known as:  REMERON  Take 1 tablet (15 mg total) by mouth at bedtime as needed.     ondansetron 4 MG tablet  Commonly known as:  ZOFRAN  Take 1 tablet (4 mg total) by mouth every 4 (four) hours as needed for nausea.     sertraline 100 MG tablet  Commonly known as:  ZOLOFT  Take 1 tablet (100 mg total) by mouth daily.     traMADol 50 MG tablet  Commonly known as:  ULTRAM  Take 1 tablet (50 mg total) by mouth 3 (three) times daily as needed for pain.     triamcinolone cream 0.1 %  Commonly known as:  KENALOG  Apply 1 application topically 2 (two) times daily. To affected area     vitamin E 400 UNIT capsule  Take 400 Units by mouth daily.       No Known  Allergies     Follow-up Information   Follow up with NADEL,SCOTT M, MD In 2 weeks.   Specialty:  Pulmonary Disease   Contact information:   [address] [place] [address] [phone]       Follow up with NADEL,SCOTT M, MD In 1 week.   Specialty:  Pulmonary Disease   Contact information:   [address] [place] [address] [phone]        The results of significant diagnostics from this hospitalization (including imaging, microbiology, ancillary and laboratory) are listed below for reference.    Significant Diagnostic Studies: Dg Chest 2 View  09/21/2013   CLINICAL DATA:  Cough. Left lower lobe pneumonia.  EXAM: CHEST  2 VIEW  COMPARISON:  CHEST x-ray 09/19/2013.  FINDINGS: Significant volume loss in the left hemithorax is evident with marked right-to-left shift of cardiomediastinal structures, similar to the prior study. This is in part related to prior left lower lobectomy, although there is undoubtedly significant atelectasis and consolidation in the remaining left upper lobe. Right lung appears relatively clear, although there is some patchy mild diffuse bronchial wall thickening, most pronounced in the right base. Moderate to large left pleural effusion is unchanged. No evidence of pulmonary edema. Heart size is likely normal, but cardiac silhouette is largely obscured. Atherosclerosis in the thoracic aorta.  IMPRESSION: 1. Overall, the radiographic appearance the chest is very similar to prior examination from 2 days ago as discussed above, with exception of slight increase in the large left pleural effusion.   Electronically Signed   By: Trudie Reed M.D.   On: 09/21/2013 09:29   Dg Chest 2 View  09/19/2013   CLINICAL DATA:  COPD. History of lung cancer.  EXAM: CHEST  2 VIEW  COMPARISON:  09/17/2013  FINDINGS: Advanced changes of COPD/ emphysema noted. There is progressive worsening aeration to the left lung with possible increase in volume of left pleural  effusion. Right lung appears clear.  IMPRESSION: 1. Worsening aeration to the left hemi thorax compared with previous exam   Electronically Signed   By: Signa Kell M.D.   On: 09/19/2013 08:15   Dg Chest 2 View  09/15/2013   CLINICAL DATA:  Short of breath, chest discomfort  EXAM: CHEST  2 VIEW  COMPARISON:  Prior chest x-ray 04/15/2013  FINDINGS: Interval development of patchy airspace disease throughout the left lower lobe concerning for pneumonia. Possible small parapneumonic effusion. Changes are superimposed on a background of left upper lobectomy with volume loss in the left hemi thorax with right-to-left shift of the cardiac and mediastinal contours. Pulmonary hyperexpansion, emphysema and chronic bronchitic changes remain stable. Atherosclerotic calcifications noted in the transverse aorta. No acute osseous abnormality.  IMPRESSION: 1. Left lower lobe pneumonia. 2. Query small associated parapneumonic effusion. 3. Chronic changes as above.   Electronically Signed   By: Malachy Moan M.D.   On: 09/15/2013 00:11   Ct Head Wo Contrast  09/15/2013   CLINICAL DATA:  Altered mental status  EXAM: CT HEAD WITHOUT CONTRAST  TECHNIQUE: Contiguous axial images were obtained from the base of the skull through the vertex without intravenous contrast.  COMPARISON:  Prior CT scan of the head 06/01/2011  FINDINGS: Negative for acute intracranial hemorrhage, acute infarction, mass, mass effect, hydrocephalus or midline shift. Gray-white differentiation is preserved throughout. Stable global cerebral volume loss. Unchanged moderate periventricular and deep white matter hypoattenuation most consistent with longstanding microvascular ischemia. Lacunar infarct versus dilated perivascular space in the left basal ganglia is unchanged. No acute soft tissue or calvarial abnormalities.  IMPRESSION: No acute intracranial abnormality.  Stable atrophy and chronic microvascular ischemic white matter changes.   Electronically  Signed   By: Malachy Moan M.D.   On: 09/15/2013 00:38   Ct Chest W Contrast  09/16/2013   CLINICAL DATA:  Patient with history of breast cancer and squamous cell lung cancer.  EXAM: CT CHEST WITH CONTRAST  TECHNIQUE: Multidetector CT imaging of the chest was performed during intravenous contrast administration.  CONTRAST:  80mL OMNIPAQUE IOHEXOL 300 MG/ML  SOLN  COMPARISON:  Chest radiograph 09/14/2013; chest CT 11/25/2005.  FINDINGS: The visualized thyroid is unremarkable. No enlarged axillary, mediastinal or hilar lymphadenopathy. The ascending thoracic aorta is dilated 3.5 cm. The main pulmonary artery is enlarged measuring 3.2 cm. Coronary artery calcifications.  Leftward shift of the mediastinum. Diffuse emphysematous change. There is endobronchial material demonstrated within the left mainstem bronchus at the level of the left atrium. Postsurgical changes involving the left hemi thorax. There are extensive consolidative opacities within the remaining left lung. Additionally there are multiple cystic spaces demonstrated within the consolidative opacity in the left lower lung which may represent a combination of underlying emphysematous change and/or necrosis. There is minimal dependent atelectasis within the right lobe as well as associated scattered ground-glass opacity. Small bilateral pleural effusions.  Limited visualization of the upper abdomen demonstrates no focal abnormality. No aggressive or acute appearing osseous lesions. Postsurgical change involving the left posterior 6th rib.  IMPRESSION: 1. Consolidative opacity within the remaining left lung. Additionally there is endobronchial material and occlusion of the left mainstem bronchus. Considerations for the findings within the left lung include pneumonia, postobstructive pneumonitis, aspiration or recurrent malignancy. Considering the endobronchial material within the left mainstem bronchus, correlation with bronchoscopy should be  considered to exclude endobronchial mass. 2. Small bilateral pleural effusions. 3. Dilation of the ascending thoracic aorta. 4. Enlargement of the main pulmonary artery as can be seen with pulmonary arterial hypertension. These results will be called to the ordering clinician or representative by the Radiologist Assistant, and communication documented in the PACS Dashboard.   Electronically Signed   By: Annia Belt M.D.   On: 09/16/2013 07:39   [place] Chest  09/22/2013   *  RADIOLOGY REPORT*  Clinical Data: Pneumonia, left parapneumonic effusion  CHEST ULTRASOUND  Comparison: 09/21/2013  Findings: Limited ultrasound performed of the left chest with the patient upright.  Dense consolidation present in the left lower lobe.  Trace left pleural effusion.  Amount of fluid visualized by ultrasound is not enough to warrant the risk of thoracentesis. Therefore, thoracentesis not performed.  IMPRESSION: Trace left effusion by ultrasound.  Thoracentesis not performed.   Original Report Authenticated By: Judie Petit. Miles Costain, M.D.   Mr Shoulder Left Wo Contrast  09/23/2013   CLINICAL DATA:  Left shoulder pain. Evaluate for infection versus gout.  EXAM: MRI OF THE LEFT SHOULDER WITHOUT CONTRAST  TECHNIQUE: Multiplanar, multisequence MR imaging of the shoulder was performed. No intravenous contrast was administered. The study is mildly motion degraded. Patient was unable to complete the examination.  COMPARISON:  Chest CT 09/15/2013.  FINDINGS: Rotator cuff: Mild supraspinatus and infraspinous tendinosis. No evidence of rotator cuff tear. The subscapularis and teres minor tendons appear normal.  Muscles: No focal muscular atrophy or edema. There is mild generalized soft tissue edema surrounding the shoulder without focal fluid collection.  Biceps long head:  Intact and normally positioned.  Acromioclavicular Joint: The acromion is type 2. There are mild acromioclavicular degenerative changes. No significant fluid is present in the  subacromial - subdeltoid bursa.  Glenohumeral Joint: No significant shoulder joint effusion or glenohumeral arthropathy.  Labrum:  No evidence of labral tear.  Bones: No significant extra-articular osseous findings. No evidence of osteomyelitis.  Pleural parenchymal scarring in the left lung appears grossly unchanged from the prior chest CT. No chest wall lesions are identified.  IMPRESSION: 1. There is mild nonspecific soft tissue edema surrounding the left shoulder. No focal fluid collection or significant joint effusion is seen to suggest infection. There is no evidence of osteomyelitis. 2. Mild supraspinatus and infraspinous tendinosis. 3. The visualized portions of the left lung appear stable with pleural parenchymal scarring status post partial lung resection.   Electronically Signed   By: Roxy Horseman M.D.   On: 09/23/2013 13:32   Dg Chest Port 1 View  09/19/2013   CLINICAL DATA:  Post bronchoscopic brushings of the left upper lobe  EXAM: PORTABLE CHEST - 1 VIEW  COMPARISON:  Portable exam 1513 hr compared to earlier exam of 09/19/2013 at 0739 hr  FINDINGS: Volume loss in left hemi thorax with mediastinal shift to the left.  Persistent opacification of the mid to inferior left hemothorax at left apex with infiltrate throughout left upper lobe as well.  Hyperexpanded right lung with underlying COPD.  Minimal atelectasis or infiltrate at right base, new.  No gross pneumothorax or new areas of consolidation identified.  Bones demineralized with pole posttraumatic deformity of the proximal right humerus.  IMPRESSION: COPD changes with unchanged appearance of the left lung and slightly increased opacity at the right base which could represent atelectasis or infiltrate.   Electronically Signed   By: Ulyses Southward M.D.   On: 09/19/2013 15:25   Dg Chest Port 1 View  09/17/2013   CLINICAL DATA:  Followup pneumonia  EXAM: PORTABLE CHEST - 1 VIEW  COMPARISON:  CT, 09/15/2013. Chest radiograph, 09/14/2013.   FINDINGS: Consolidation in the remaining left lung has mildly increased most evident in the upper lung zone. There is dense consolidation at the base obscuring the heart border and hemidiaphragm.  The right lung is hyperexpanded. There are irregularly thickened interstitial markings with some hazy basilar airspace opacity, which may reflect a new area  of pneumonia.  No pneumothorax. Changes from left lung surgery are stable.  IMPRESSION: 1. Mild increase in left lung consolidation when compared to the prior chest radiograph. 2. Mild hazy airspace opacity at the right lung base may reflect a new area of pneumonia or be due to atelectasis.   Electronically Signed   By: Amie Portland M.D.   On: 09/17/2013 07:30   Dg C-arm Bronchoscopy  09/19/2013   CLINICAL DATA: abnormal ct   C-ARM BRONCHOSCOPY  Fluoroscopy was utilized by the requesting physician.  No radiographic  interpretation.     Microbiology: Recent Results (from the past 240 hour(s))  MRSA PCR SCREENING     Status: None   Collection Time    09/16/13  6:31 PM      Result Value Range Status   MRSA by PCR NEGATIVE  NEGATIVE Final   Comment:            The GeneXpert MRSA Assay (FDA     approved for NASAL specimens     only), is one component of a     comprehensive MRSA colonization     surveillance program. It is not     intended to diagnose MRSA     infection nor to guide or     monitor treatment for     MRSA infections.  AFB CULTURE WITH SMEAR     Status: None   Collection Time    09/19/13  3:04 PM      Result Value Range Status   Specimen Description BRONCHIAL ALVEOLAR LAVAGE LUL   Final   Special Requests NONE   Final   ACID FAST SMEAR     Final   Value: NO ACID FAST BACILLI SEEN     Performed at [facility]   Culture     Final   Value: CULTURE WILL BE EXAMINED FOR 6 WEEKS BEFORE ISSUING A FINAL REPORT     Performed at [facility]   Report Status PENDING   Incomplete  CULTURE, BAL-QUANTITATIVE     Status:  None   Collection Time    09/19/13  3:04 PM      Result Value Range Status   Specimen Description BRONCHIAL ALVEOLAR LAVAGE LUL   Final   Special Requests NONE   Final   Gram Stain     Final   Value: MODERATE WBC PRESENT, PREDOMINANTLY PMN     NO SQUAMOUS EPITHELIAL CELLS SEEN     NO ORGANISMS SEEN     Performed at [facility] Count     Final   Value: 200 COLONIES/ML     Performed at [facility]   Culture     Final   Value: PSEUDOMONAS AERUGINOSA     Performed at [facility]   Report Status 09/23/2013 FINAL   Final   Organism ID, Bacteria PSEUDOMONAS AERUGINOSA   Final  FUNGUS CULTURE W SMEAR     Status: None   Collection Time    09/19/13  3:04 PM      Result Value Range Status   Specimen Description BRONCHIAL ALVEOLAR LAVAGE LUL   Final   Special Requests NONE   Final   Fungal Smear     Final   Value: NO YEAST OR FUNGAL ELEMENTS SEEN     Performed at [facility]   Culture     Final   Value: CULTURE IN PROGRESS FOR FOUR WEEKS  Performed at [facility]   Report Status PENDING   Incomplete     Labs: Basic Metabolic Panel:  Recent Labs Lab 09/22/13 0515 09/23/13 0555 09/24/13 0523 09/25/13 0435 09/26/13 0510  NA 143 144 140 143 138  K 3.3* 3.9 4.1 4.2 4.2  CL 106 105 103 107 105  CO2 29 32 30 29 25   GLUCOSE 89 87 91 98 93  BUN 6 5* 7 8 7   CREATININE 0.66 0.70 0.72 0.73 0.69  CALCIUM 8.7 9.0 8.6 9.0 8.6  MG 1.8 2.0 2.1 2.4 2.2   Liver Function Tests:  Recent Labs Lab 09/21/13 0645 09/22/13 0515 09/23/13 0555 09/24/13 0523 09/25/13 0435  AST 13 15 27 23 29   ALT 10 10 18 17 23   ALKPHOS 42 48 52 50 55  BILITOT <0.1* <0.1* 0.2* <0.1* <0.1*  PROT 4.9* 5.3* 5.8* 5.5* 5.8*  ALBUMIN 1.9* 2.1* 2.4* 2.2* 2.5*   No results found for this basename: LIPASE, AMYLASE,  in the last 168 hours No results found for this basename: AMMONIA,  in the last 168 hours CBC:  Recent Labs Lab 09/21/13 0645  09/22/13 0515 09/23/13 0555 09/24/13 0523 09/25/13 0435 09/26/13 0510  WBC 9.7 10.2 11.5* 10.6* 11.4* 10.2  NEUTROABS 6.6 7.1 8.4* 8.0* 8.3*  --   HGB 9.3* 9.8* 10.5* 9.7* 10.7* 9.7*  HCT 28.8* 30.2* 32.2* 30.0* 32.9* 30.3*  MCV 90.3 89.9 89.4 90.4 89.9 90.7  PLT 401* 477* 523* 467* 549* 515*   Cardiac Enzymes: No results found for this basename: CKTOTAL, CKMB, CKMBINDEX, TROPONINI,  in the last 168 hours BNP: BNP (last 3 results)  Recent Labs  09/16/13 1832  PROBNP 3115.0*   CBG: No results found for this basename: GLUCAP,  in the last 168 hours     Signed:  Jonette Mate N  [facility] 09/26/2013, 11:14 AM

## 2013-09-26 NOTE — Progress Notes (Signed)
Patient cleared for discharge. Packet copied and placed in [facility]. CSW spoke with patient's daughter, [name], discussed that there is no guarantee of payment for transport. She wants patient to go by [facility]. [facility] called for transportation. Daughter thanked CSW for assistance and she is glad that patient is able to go to [facility].  Rilea Arutyunyan C. Shirlean Berman MSW, LCSW [phone]

## 2013-09-26 NOTE — Progress Notes (Signed)
Occupational Therapy Treatment Patient Details Name: Leslie [name] MRN: [id number] DOB: 1929-01-28 Today's Date: 09/26/2013 Time: 9147-8295 OT Time Calculation (min): 28 min  OT Assessment / Plan / Recommendation  History of present illness pt was admitted with worsening confusion.  has pna. Pt had bronchoscopy 10/13 and is planned for possible thoracocentesis 10/16   OT comments  Pt limited by pain this session. Had just worked with PT and nursing present part of the way through session for pain meds. Pt did participate in grooming and toileting tasks.     Follow Up Recommendations  SNF;Supervision/Assistance - 24 hour    Barriers to Discharge       Equipment Recommendations  3 in 1 bedside comode    Recommendations for Other Services    Frequency Min 2X/week   Progress towards OT Goals Progress towards OT goals:  (limited by pain)  Plan Discharge plan remains appropriate    Precautions / Restrictions Precautions Precautions: Fall Precaution Comments: monitor sats Restrictions Weight Bearing Restrictions: No   Pertinent Vitals/Pain Didn't rate, states "it hurts" low back; nursing came to give pain meds, reposition in bed.    ADL  Toilet Transfer: Performed;Minimal assistance Toilet Transfer Method: Surveyor, minerals: Materials engineer and Hygiene: Performed;Minimal assistance Where Assessed - Engineer, mining and Hygiene: Sit to stand from 3-in-1 or toilet Equipment Used: Rolling walker ADL Comments: Pt stating she was uncomfortable in the chair with pain reported in her low back. Pt agreeable to work with OT and stood for 5 minutes to apply lotion and work toward increased activity tolerance in standing. Pt requesting to return to bed due to back pain but needed to use BSC first. Pivoted to [facility] with min assist and mod cues for hand placement and to hold to walker until fully backed  up to [facility]. Returned  to bed and nursing in room to give pain meds. Pt states supposed to d/c to SNF today.    OT Diagnosis:    OT Problem List:   OT Treatment Interventions:     OT Goals(current goals can now be found in the care plan section)    Visit Information  Last OT Received On: 09/26/13 Assistance Needed: +1 History of Present Illness: pt was admitted with worsening confusion.  has pna. Pt had bronchoscopy 10/13 and is planned for possible thoracocentesis 10/16    Subjective Data      Prior Functioning       Cognition  Cognition Arousal/Alertness: Awake/alert Behavior During Therapy: WFL for tasks assessed/performed Overall Cognitive Status: Within Functional Limits for tasks assessed    Mobility   Transfers Transfers: Sit to Stand;Stand to Sit Sit to Stand: 4: Min assist;With upper extremity assist;From chair/3-in-1 Stand to Sit: 4: Min assist;With upper extremity assist;To chair/3-in-1;To bed Details for Transfer Assistance: assist to steady and control descent. verbal cues for safety with hand placement and align with BSC and bed before sitting.    Exercises      Balance Static Standing Balance Static Standing - Balance Support: No upper extremity supported Static Standing - Level of Assistance: 4: Min assist   End of Session OT - End of Session Equipment Utilized During Treatment: Rolling walker Activity Tolerance: Patient limited by pain Patient left: in bed;with call bell/phone within reach;with nursing/sitter in room  GO     Leslie [name] [phone] 09/26/2013, 11:17 AM

## 2013-10-03 ENCOUNTER — Ambulatory Visit (INDEPENDENT_AMBULATORY_CARE_PROVIDER_SITE_OTHER): Payer: Medicare Other | Admitting: Adult Health

## 2013-10-03 ENCOUNTER — Ambulatory Visit (INDEPENDENT_AMBULATORY_CARE_PROVIDER_SITE_OTHER)
Admission: RE | Admit: 2013-10-03 | Discharge: 2013-10-03 | Disposition: A | Discharge status: Home / Self Care | Payer: Medicare Other | Source: Ambulatory Visit | Attending: Adult Health | Admitting: Adult Health

## 2013-10-03 ENCOUNTER — Encounter: Payer: Self-pay | Admitting: Adult Health

## 2013-10-03 VITALS — BP 124/70 | HR 92 | Temp 98.3°F | Ht 60.0 in | Wt 79.4 lb

## 2013-10-03 DIAGNOSIS — J189 Pneumonia, unspecified organism: Secondary | ICD-10-CM

## 2013-10-03 DIAGNOSIS — J151 Pneumonia due to Pseudomonas: Secondary | ICD-10-CM

## 2013-10-03 DIAGNOSIS — Z23 Encounter for immunization: Secondary | ICD-10-CM

## 2013-10-03 MED ORDER — LEVOFLOXACIN 750 MG PO TABS: 750.0000 mg | ORAL_TABLET | ORAL | Status: AC

## 2013-10-03 MED ORDER — ALBUTEROL SULFATE (2.5 MG/3ML) 0.083% IN NEBU: 2.5000 mg | INHALATION_SOLUTION | Freq: Two times a day (BID) | RESPIRATORY_TRACT | Status: DC

## 2013-10-03 NOTE — Patient Instructions (Signed)
Restart Levaquin 750mg  every other day for 10 days .  Begin Albuterol Neb Twice daily   Follow up Dr. [name]  In 3 weeks with chest xray  Please contact office for sooner follow up if symptoms do not improve or worsen or seek emergency care

## 2013-10-03 NOTE — Assessment & Plan Note (Addendum)
Clinically improving  Will restart Levaquin to complete course for Psuedomonas PNA w/ effusion  cxr is improving  Restart pulmonary hygiene w/ nebs Case discussed with Dr. [name]    Plan: Restart Levaquin 750mg  every other day for 10 days .  Begin Albuterol Neb Twice daily   Follow up Dr. [name]  In 3 weeks with chest xray  Please contact office for sooner follow up if symptoms do not improve or worsen or seek emergency care

## 2013-10-03 NOTE — Progress Notes (Signed)
Subjective:    Patient ID: Leslie [name], female    DOB: January 22, 1929, 79 y.o.   MRN: [id number]  HPI 79 y/o WF    10/03/2013 Post Hospital follow up  Pt returns for a post hospital follow up  Admitted 10/8-20, 2014 for  Psuedomonas PNA . Left sided parapneumonic effusion  Underwent FOB on 10/13 w/ neg malignant cells.  Tx with Zosyn initially then transitioned to Levaquin 750mg  every other day on 10/17 prior to discharge.  She was discharged to [facility] for rehab.  She returns today with daughter. She is feeling better with less dyspnea and cough.  CXR today shows improved aeration on left.  Unfortunately she was suppose to be on Levaquin for extended time ~3 weeks  But has not gotten Levaquin since 09/26/13 .  No hemoptysis, edema , fever, orthopnea, or n/v/d.  Does have chest wall pain and tenderness along left back and ribs.           Problem List:      HEARING LOSS (ICD-389.9) - she refuses hearing eval...  DIZZINESS >> we started MECLIZINE 25mg - 1/2 to 1 tab every 4H as needed...  COPD (ICD-496) - ex-smoker... on ALBUT NEBS up to Qid, ADVAIR 250Bid, & Prn Mucinex... ~  CTChest 6/06 w/ stable post-op changes on left, no recurrence, scarring RLL, sm gallstones... ~  CXR 8/08 w/ vol loss & post-op changes on left, right clear...  ~  CXR 9/09 showed stable post op changes on the left, NAD.Marland Kitchen. ~  CXR 9/10 unchanged- stable post op appearance of left chest, NAD.Marland Kitchen. ~  CXR 8/11 showed post op changes & scarring, COPD, NAD.Marland Kitchen. ~  CXR 6/12 in [place] showed chr changes w/ scarring & blunt angle on left, hyperinflation on right, diffuse osteopenia, NAD.Marland Kitchen. ~  CXR 5/13 showed stable post-op changes/ vol loss/ scarring on left, atherosclerotic calcif in Ao, COPD changes, NAD.Marland Kitchen. ~  CXR 5/14 shows norm heart size, prior surg & sm lung vol w/ scarring on left, hyperinflation on right, NAD...  Hx of CARCINOMA, LUNG, SQUAMOUS CELL (ICD-162.9) - s/p left thoracotomy w/ LLLobectomy for squamous  cell ca 10/99 by DrBurney... no known recurrence.  GERD (ICD-530.81) - prev on Prevacid OTC... last EGD was 11/99 showing GERD, otherw neg... 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  note: she states the Prevacid really helps and the generic Omeprazole didn't help... ~  8/13:  We added ZOFRAN 4mg  Q4H as needed for nausea... ~  11/13:  She requests to incr her PREVACID 15mg  to Bid- OK...  IBS- Constipation  >>  on BENTYL 20mg  prn...last colonoscopy was 7/03 by DrPerry & was normal- no pathologic findings... ~  5/13:  Asked to start Miralax 1 capful in water daily...  GALLSTONES (ICD-574.20)  Hx of ALCOHOLISM (ICD-303.90)  Hx of ADENOCARCINOMA, BREAST (ICD-174.9) - DrMagrinat stopped her Tamoxifen after 49yrs and released her on 12/08... she had a left breast lumpectomy and sentinel node biopsy 12/03 by [facility] for a 2.2cm infiltrating carcinoma, neg LN's, ER/PR pos, treated w/ XRT, then Tamoxifen for 31yrs... ~  Mammogram 5/09 was negative... ~  Mammogram 5/10 at [facility] was neg- fatty replacement, post-op changes... ~  She is overdue for f/u mammogram & will call [facility] at her convenience- reminded again...  DEGENERATIVE JOINT DISEASE (ICD-715.90) - she's had a prev lumbar laminectomy & a fractured left hip after a fall (repaired by DrDuda [id number])... ~  [facility] CTNeck 6/12 after fall showed severe DDD & facet dis, and anterolisthesis at several levels... ~  Fall at home 6/12 w/ fx right humerus & fx right patella> treated by DrDuda in hosp & NH rehab...  OSTEOPOROSIS (ICD-733.00) - she was prev on Boniva150mg /month along w/ calcium and vitD supplements==> given IV RECLAST 05/07/12. ~  BMD 8/08 shows severe osteoporosis w/ TScores -3.5 in the spine and -3.7 in the hip...  improved from 5/06 study! ~  Vit D level 3/09 = 30 & 50K/wk Rx started...  ~  Vit D level 9/09 = 66 & switched to 1000u OTC daily... ~  BMD  10/10 showed TScores -3.3 Spine, and -3.1 in right fem neck... sl improved from 2008, continue Rx, avoid trauma. ~  Labs in [facility] 6/12 included Vit D level = 68 ~  Labs 5/13 showed VitD level = 61 ~  5/13:  She does not want to take Boniva any longer & is asking for IV med alternative> we will look into RECLAST eligibility for her==> given 1st dose IV RECLAST 05/07/12... ~  BMD 5/14 ordered=> pending and then we will set up her next Reclast infusion...                        NEUROPATHY (ICD-355.9) - on NEURONTIN 300mg  Tid... it really helps her back pain.  Early Dementia >> ~  CT Brain 6/12 in ER after fall showed atrophy & chr sm vessel dis, NAD...  ANXIETY (ICD-300.00) - on LIBRIUM 10mg tid, ZOLOFT 100mg /d and REMERON 15mg Qhs... she wishes to continue all of these the same.  PSORIASIS (ICD-696.1) -  treated by Elnora Morrison w/ MTX- intol pills, prev on shots... ~  5/14:  She requests refill Triamcinolone cream now that Elnora Morrison has retired...  ANEMIA (ICD-285.9) - she is rec to take Women's MVI, Vit B12 104mcg/d, Vit D 1000 u/d... ~  labs 3/09 showed Hg= 11.9 w/ Fe= 67... started Feosol 200mg /d... ~  labs 9/09 showed Hg= 13.3 w/ Fe= 84 ~  labs 9/10 showed Hg= 13.3, Fe= 111... pt stopped the Fe supplement. ~  labs 8/11 showed Hg= 12.9, Fe= 94 (23%sat), B12= 302 ~  Labs 6/12 in [place] showed Hg= 12.9==>10.4 ~  Labs 5/13 showed Hg= 13.3 ~  Labs 5/14 showed Hg= 13.9  HEALTH MAINTENANCE:  She takes a lot of Vitamins> B12, D, E, MVI, calcium etc... Plus nutritional supplements- Ensure, Boost, etc...   Past Surgical History  Procedure Laterality Date  . Lumbar laminectomy    . 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 Left lung surgery with lllobectomy for squamous cell cancer  09/1998    Dr. Edwyna Shell  . Left breast lumpectomy with sentinel lymph node biopsy    . Left femur fracture repaired  04/2004    Dr. Lajoyce Corners  . Video bronchoscopy Bilateral 09/19/2013     Procedure: VIDEO BRONCHOSCOPY WITH FLUORO;  Surgeon: Oretha Milch, MD;  Location: [facility];  Service: Cardiopulmonary;  Laterality: Bilateral;    Outpatient Encounter Prescriptions as of 10/03/2013  Medication Sig Dispense Refill  . albuterol (PROVENTIL) (2.5 MG/3ML) 0.083% nebulizer solution Use 1 vial in nebulizer up to 3 times daily as needed FILE UNDER MCR PART B  90 vial  3  . calcium carbonate (OS-CAL) 600 MG TABS Take 600 mg by mouth 2 (two) times daily with a meal.        . chlordiazePOXIDE (LIBRIUM) 10 MG capsule Take  10 mg by mouth 3 (three) times daily.      . cholecalciferol (VITAMIN D) 1000 UNITS tablet Take 1,000 Units by mouth daily.       . cyanocobalamin 2000 MCG tablet Take 2,000 mcg by mouth daily.        Marland Kitchen dicyclomine (BENTYL) 20 MG tablet TAKE 1 TABLET BY MOUTH 3 TIMES A DAY AS NEEDED FOR CRAMPING  100 tablet  2  . Fluticasone-Salmeterol (ADVAIR DISKUS) 250-50 MCG/DOSE AEPB INHALE 1 PUFF TWICE A DAY  60 each  11  . gabapentin (NEURONTIN) 300 MG capsule TAKE ONE CAPSULE BY MOUTH 3 TIMES A DAY  90 capsule  5  . lansoprazole (PREVACID) 15 MG capsule Take 1 capsule (15 mg total) by mouth 2 (two) times daily.  60 capsule  11  . levofloxacin (LEVAQUIN) 750 MG tablet Take 1 tablet (750 mg total) by mouth every other day.  10 tablet  0  . meclizine (ANTIVERT) 25 MG tablet Take 1/2 to 1 tablet by mouth every 4 hours as needed for dizziness  50 tablet  6  . megestrol (MEGACE) 40 MG/ML suspension Take 800 mg by mouth daily.      . mirtazapine (REMERON) 15 MG tablet Take 1 tablet (15 mg total) by mouth at bedtime as needed.  30 tablet  5  . Multiple Vitamins-Minerals (CENTRUM SILVER PO) Take 1 tablet by mouth daily.        . ondansetron (ZOFRAN) 4 MG tablet Take 1 tablet (4 mg total) by mouth every 4 (four) hours as needed for nausea.  30 tablet  5  . sertraline (ZOLOFT) 100 MG tablet Take 1 tablet (100 mg total) by mouth daily.  30 tablet  6  . traMADol (ULTRAM) 50 MG tablet Take  50 mg by mouth every 6 (six) hours as needed for pain.      Marland Kitchen triamcinolone cream (KENALOG) 0.1 % Apply 1 application topically 2 (two) times daily. To affected area  480 g  11  . vitamin E 400 UNIT capsule Take 400 Units by mouth daily.        . [DISCONTINUED] chlordiazePOXIDE (LIBRIUM) 10 MG capsule Take 1 capsule (10 mg total) by mouth 3 (three) times daily as needed for anxiety.  90 capsule  3  . [DISCONTINUED] levofloxacin (LEVAQUIN) 750 MG tablet Take 1 tablet (750 mg total) by mouth every other day.      . [DISCONTINUED] megestrol (MEGACE ORAL) 40 MG/ML suspension Take 1 teaspoonful by mouth four times daily  600 mL  6  . [DISCONTINUED] traMADol (ULTRAM) 50 MG tablet Take 1 tablet (50 mg total) by mouth 3 (three) times daily as needed for pain.  90 tablet  5  . albuterol (PROVENTIL) (2.5 MG/3ML) 0.083% nebulizer solution Take 3 mLs (2.5 mg total) by nebulization 2 (two) times daily.  180 mL  12  . [DISCONTINUED] acetaminophen (TYLENOL) 325 MG tablet Take 650 mg by mouth every 6 (six) hours as needed for pain.      . [DISCONTINUED] ibuprofen (ADVIL,MOTRIN) 400 MG tablet Take 400 mg by mouth every 6 (six) hours as needed for pain.       No facility-administered encounter medications on file as of 10/03/2013.    No Known Allergies   Current Medications, Allergies, Past Medical History, Past Surgical History, Family History, and Social History were reviewed in [employer] record.    Review of Systems        See HPI - all  other systems neg except as noted...  The patient complains of dyspnea on exertion and muscle weakness.  The patient denies anorexia, fever, weight loss, weight gain, vision loss, decreased hearing, hoarseness, chest pain, syncope, peripheral edema, prolonged cough, headaches, hemoptysis, abdominal pain, melena, hematochezia, severe indigestion/heartburn, hematuria, incontinence, suspicious skin lesions, transient blindness, difficulty walking,  depression, unusual weight change, abnormal bleeding, enlarged lymph nodes, and angioedema.     Objective:   Physical Exam    WD, Thin, 79 y/o WF in NAD... she is chr ill appearing... GENERAL:  Alert & oriented; pleasant & cooperative... HEENT:  [place]/AT, EOM-wnl, PERRLA, EACs-clear, TMs-wnl, NOSE-clear, THROAT-clear & wnl.  NECK:  Supple w/ fairROM; no JVD; normal carotid impulses w/o bruits; no thyromegaly or nodules palpated; no lymphadenopathy. CHEST:  Decr BS   without wheezes/ rales/ or rhonchi heard... ABD : soft,  NT , BS + , no guarding.  EXT : arthritic changes.   DERM:  No lesions noted; no rash etc    Assessment & Plan:

## 2013-10-04 ENCOUNTER — Telehealth: Payer: Self-pay | Admitting: Adult Health

## 2013-10-04 ENCOUNTER — Telehealth: Payer: Self-pay | Admitting: Pulmonary Disease

## 2013-10-04 NOTE — Telephone Encounter (Signed)
Pt seen yesterday in office by TP-- Daughter states that the [facility] that was faxed over by [facility] was not the full MAR(missing pages) Daughter states that TP/SN were under the impression yesterday at OV that the pt had not been taking the abx as rxd.  Daughter reports that the rest of the [facility] states that she has been taking the Levaquin as rxd.  Faxing full MAR to our office to be reviewed by TP or SN to give recs on if patient does in fact need additional days of levaquin called into pharmacy.  Will send message to [facility], since SN is PCP for pt, to advise on treatment.

## 2013-10-04 NOTE — Telephone Encounter (Signed)
lmomtcb x1 for Leslie [name]

## 2013-10-04 NOTE — Telephone Encounter (Signed)
Albuterol neb and Levaquin printed for pt's daughter at yesterday's ov 10.27.14 w/ TP Called [facility], spoke with pharmacist Hessie Diener who reported that daughter took rx there and needs a dx code COPD on pt's problem list >> [phone] Nothing further needed; will sign off.

## 2013-10-04 NOTE — Telephone Encounter (Signed)
Per SN---  He has reviewed the MAR----it looks like she will finish the levaquin on 10/09/2013.  So we will let her finish this course and she will not need more.  Keep her follow up with SN.  thanks

## 2013-10-04 NOTE — Telephone Encounter (Signed)
lmtcb x1 

## 2013-10-05 ENCOUNTER — Telehealth: Payer: Self-pay | Admitting: Adult Health

## 2013-10-05 DIAGNOSIS — Z23 Encounter for immunization: Secondary | ICD-10-CM

## 2013-10-05 NOTE — Telephone Encounter (Signed)
Received notification from Ch[facility]el that [facility] is requesting documentation that pt has had the flu shot.  Per Johny Drilling, the info needs to be faxed to [phone] attn Joy  Immunization report printed and faxed to the number above.  Will sign off.

## 2013-10-05 NOTE — Telephone Encounter (Signed)
Pt daughter is aware. Jennifer Castillo, CMA  

## 2013-10-17 LAB — FUNGUS CULTURE W SMEAR: Fungal Smear: NONE SEEN

## 2013-10-18 ENCOUNTER — Telehealth: Payer: Self-pay | Admitting: Pulmonary Disease

## 2013-10-18 DIAGNOSIS — J189 Pneumonia, unspecified organism: Secondary | ICD-10-CM

## 2013-10-18 NOTE — Telephone Encounter (Signed)
Per last OV instruct pt is to have a cxr with f/u. Order placed and pt advised. Carron Curie, CMA

## 2013-10-19 ENCOUNTER — Ambulatory Visit (INDEPENDENT_AMBULATORY_CARE_PROVIDER_SITE_OTHER): Payer: Medicare Other | Admitting: Pulmonary Disease

## 2013-10-19 ENCOUNTER — Ambulatory Visit (INDEPENDENT_AMBULATORY_CARE_PROVIDER_SITE_OTHER)
Admission: RE | Admit: 2013-10-19 | Discharge: 2013-10-19 | Disposition: A | Discharge status: Home / Self Care | Payer: Medicare Other | Source: Ambulatory Visit | Attending: Pulmonary Disease | Admitting: Pulmonary Disease

## 2013-10-19 ENCOUNTER — Encounter: Payer: Self-pay | Admitting: Pulmonary Disease

## 2013-10-19 VITALS — BP 106/60 | HR 96

## 2013-10-19 DIAGNOSIS — K219 Gastro-esophageal reflux disease without esophagitis: Secondary | ICD-10-CM

## 2013-10-19 DIAGNOSIS — M1990 Unspecified osteoarthritis, unspecified site: Secondary | ICD-10-CM

## 2013-10-19 DIAGNOSIS — J9600 Acute respiratory failure, unspecified whether with hypoxia or hypercapnia: Secondary | ICD-10-CM

## 2013-10-19 DIAGNOSIS — G589 Mononeuropathy, unspecified: Secondary | ICD-10-CM

## 2013-10-19 DIAGNOSIS — E43 Unspecified severe protein-calorie malnutrition: Secondary | ICD-10-CM

## 2013-10-19 DIAGNOSIS — J189 Pneumonia, unspecified organism: Secondary | ICD-10-CM

## 2013-10-19 DIAGNOSIS — J449 Chronic obstructive pulmonary disease, unspecified: Secondary | ICD-10-CM

## 2013-10-19 DIAGNOSIS — K589 Irritable bowel syndrome without diarrhea: Secondary | ICD-10-CM

## 2013-10-19 DIAGNOSIS — D649 Anemia, unspecified: Secondary | ICD-10-CM

## 2013-10-19 DIAGNOSIS — M810 Age-related osteoporosis without current pathological fracture: Secondary | ICD-10-CM

## 2013-10-19 DIAGNOSIS — J9601 Acute respiratory failure with hypoxia: Secondary | ICD-10-CM

## 2013-10-19 DIAGNOSIS — C50919 Malignant neoplasm of unspecified site of unspecified female breast: Secondary | ICD-10-CM

## 2013-10-19 DIAGNOSIS — J151 Pneumonia due to Pseudomonas: Secondary | ICD-10-CM

## 2013-10-19 DIAGNOSIS — C3490 Malignant neoplasm of unspecified part of unspecified bronchus or lung: Secondary | ICD-10-CM

## 2013-10-19 DIAGNOSIS — F411 Generalized anxiety disorder: Secondary | ICD-10-CM

## 2013-10-19 DIAGNOSIS — R413 Other amnesia: Secondary | ICD-10-CM

## 2013-10-19 NOTE — Patient Instructions (Signed)
Today we updated your med list in our EPIC system...    Continue your current medications the same...  We will request the nursing home to administer the NEBULIZER 3 times daily on a regular basis...  We can wean the Oxygen to 1L/min at night and just as needed during the day when up and about...  Call for any questions...  Let's plan a follow up visit in 70mo, sooner if needed for problems.Marland KitchenMarland Kitchen

## 2013-10-19 NOTE — Progress Notes (Signed)
Subjective:    Patient ID: Leslie [name], female    DOB: 1929-05-06, 79 y.o.   MRN: [id number]  HPI 79 y/o WF here for a 6 month follow up visit... she has mult medical problems as listed...   ~  Apr 09, 2012:  335mo ROV & she is c/o feeling bad, more SOB, all due to the pollen she thinks; on NEBS, Advair, & Albut HFA rescue inhaler; reminded to use NEBS 2-4x daily & the Advair Bid every day (the rescue inhaler is just for prn use when out & about); asked to get air filter at home to decr pollen exposure...    She has DJD & LBP w/ prev surg; she fell 6/12 w/ fx right humerus & patella- Rx DrDuda; she has been on Boniva for her severe Osteoporosis & wants to stop this & switch to IV med- we will see if she is elig & cost of RECLAST for her...  CXR 5/13 showed stable post-op changes/ vol loss/ scarring on left, atherosclerotic calcif in Ao, COPD changes, NAD... LABS 5/13:  Chems- wnl;  CBC- wnl;  TSH=2.45;  VitD=61;  VitB12>1500...  ~  July 23, 2012:  49mo ROV & add-on appt for dizziness> they report onset of ?dizziness & nausea w/o vomit yest x1d; pt states "just liked to fall- I can't explain it, it scared me";  She denies focal weakness, facial changes, slurred speech, or sensory changes;  She denies CP, palpit, syncope, etc;  She has hx of "inner ear" w/ similar symptoms yrs ago;  Family wondered if symptoms could come from poor appetite & poor intake, perhaps hypoglycemia as she seemed to do better after Boost...    We discussed Rx w/ Meclizine prn & Zofran for the nausea;  She did not want to pursue Cardiac arrhythmia or Neuro evaluations at this point...    Family did request appetite stimulant & we wrote for MEGACE 40mg /ml> 1 tsp po Qid for appetite; plus 6 sm meals daily (may use Ensure/ Boost betw N/ L/ D & bedtime...  ~  October 15, 2012:  49mo ROV & Aideen has gained 2# to 82# today taking Megace but just 1tsp daily (rec to incr to Qid as prescribed); she also notes that Meclizine has helped  dizziness, & Zofran helped the nausea; she wants to incr her Prevacid 15mg  to Bid- ok...    COPD, Hx LungCa, s/p LLLobectomy 1999> on Advair250, NEBS, Mucinex; CXR 5/13 w/ chr post op changes, NAD...    GERD> on Prev15; wants to incr to Bid as above- ok...    IBS w/ Constip> on Bentyl & Miralax but won't take it regularly as advised...    Hx Breast Ca> she has been off Tamoxifen since 2008; she needs to get her f/u mammogram at [facility]...    DJD, Osteoporosis> s/p LLam, left hip fx, right humerus fx, right patella fx, & severe DDD; Ortho per DrDuda; prev on boniva, now Recleast w/ infusion 5/13...    Dementia, Neuropathy> CT w/ atrophy 7 chr sm vessel dis; on Neurontin 300Tid    Anxiety> hx Etoh in past, on Librium, Zoloft, Remeron...    Psoriasis> followed by DrFHouston... We reviewed prob list, meds, xrays and labs> see below for updates >>   ~  Apr 15, 2013:  335mo ROV & Larae says she feels OK, appetite is fair & helped by Megace but her weight is down 5# to 76# at present (BMI=15); she is asked to take the Megace every  day & incr her nutritional supplements to Tid as well, must gain some weight... We reviewed the following medical problems during today's office visit >>     COPD, Hx LungCa, s/p LLLobectomy 1999> on Advair250, NEBS, Mucinex; denies CP, palpit, SOB, edema; CXR 5/14 is stable w/ decr lung vol on left, scarring unchanged, NAD...     GERD> on Prev15Bid, Zofran4 prn; she still has intermit nausea that responds to Zofran; needs to gain weight & this may help...    IBS w/ Constip> on Bentyl & Miralax but won't take it regularly as advised...    Underweight> she has never been large but BMI is 15-16 & needs to gain wt; she has MegaceQid & asked to incr nutritional supplements to Tid...    Hx Breast Ca> she has been off Tamoxifen since 2008; she needs to get her f/u mammogram at [facility] but she hasn't done this yet...    DJD, Osteoporosis> on Tramadol prn; s/p LLam, left hip fx, right  humerus fx, right patella fx, & severe DDD; Ortho per Tyna Jaksch; prev on Boniva, now Reclast w/ infusion 5/13; BMD due now & next Reclast infusion pending...    Dementia, Neuropathy> CT w/ atrophy & chr sm vessel dis; on Neurontin 300Tid which helps her back pain she says...    Anxiety> hx Etoh in past, on Librium, Zoloft, Remeron; and her daughter helps w/ her meds...    Psoriasis> followed by DrFHouston (now retired) & she is requesting refill of Triamcinolone cream... We reviewed prob list, meds, xrays and labs> see below for updates >>  CXR 5/14 shows norm heart size, prior surg & sm lung vol w/ scarring on left, hyperinflation on right, NAD... LABS 5/14:  Chems- wnl;  CBC- wnl;  TSH=2.30;  VitD=51...  BMD 5/14=> pending  ~  October 19, 2013:  16mo ROV & post hospital check> she was [facility] 10/8 - 09/26/13 by [facility] w/ consult by Tedd Sias for Pulm; she had dense left pneumonia which was slow to clear despite broad spectrum antibiotics; Bronch 10/13 yielded Pseudomoas that was pan-sens and initial Rochepin/Zithro=> changed to Zosyn/Vanco=> & later to Levaquin 750 at discharge... XRay improved but signif resid LLL vol loss & infiltrate remained... Since disch she has improved in the NH for rehab ([facility]) but daugh has lots of concerns including the fact that she is not getting her NEBS regularly & wonders if she can wean off the Oxygen...     COPD, Hx LungCa, s/p LLLobectomy 1999 & Pseudomonas pneumonia 10/14> on Advair250Bid, NEBS w/ AlbutTid (needs this regularly), Mucinex, etc; denies CP, palpit, ch in SOB, edema; Hosp 10/14 w/ Pseudomonas pneum in left lung, dense consolid, required bronch & adjust in antibiotics to get improvement, disch to [facility] for rehab=> finished Levaquin, we will incr Nebs to regular dosing!    GERD> on Prev15Bid, Zofran4 prn; she still has intermit nausea that responds to Zofran; needs to gain weight & this may help...    IBS w/ Constip> on Bentyl & Miralax but won't take it  regularly as advised...    Underweight> she has never been large but BMI is 15-16 & needs to gain wt; she has MegaceQid & asked to incr nutritional supplements to Tid...    Hx Breast Ca> she has been off Tamoxifen since 2008; she needs to get her f/u mammogram at [facility] but she hasn't done this yet...    DJD, Osteoporosis> on Tramadol prn; s/p LLam, left hip fx, right humerus fx, right patella fx, &  severe DDD; Ortho per Tyna Jaksch; prev on Boniva, now Reclast w/ infusion 5/13; BMD f/u 5/14 showed TScores -3.6 in Spine and -3.5 in [facility]; she was given 5mg  IV Reclast on 05/13/13...    Dementia, Neuropathy> CT w/ atrophy & chr sm vessel dis; on Neurontin 300Tid which helps her back pain she says...    Anxiety> hx Etoh in past, on Librium, Zoloft, Remeron; and her daughter helps w/ her meds prior to the NH...    Psoriasis> followed by DrFHouston (now retired) & she is requesting refill of Triamcinolone cream... We reviewed prob list, meds, xrays and labs> see below for updates >>  2DEcho 10/14 in hosp showed norm LV size & function w/ EF=60-65%, norm wall motion, Gr1DD, mild AI, mild post leaflet MVP, otherw neg... CXR 11/14 shows further improvement in left lung aeration w/ some resid atelec, infiltrate, scarring, & vol loss at the left base...          Problem List:      HEARING LOSS (ICD-389.9) - she refuses hearing eval...  DIZZINESS >> we started MECLIZINE 25mg - 1/2 to 1 tab every 4H as needed...  COPD (ICD-496) - ex-smoker... on ALBUT NEBS up to Qid, ADVAIR 250Bid, & Prn Mucinex... PSEUDOMONAS PNEUMONIA in LLL 10/14 >>  ~  CTChest 6/06 w/ stable post-op changes on left, no recurrence, scarring RLL, sm gallstones... ~  CXR 8/08 w/ vol loss & post-op changes on left, right clear...  ~  CXR 9/09 showed stable post op changes on the left, NAD.Marland Kitchen. ~  CXR 9/10 unchanged- stable post op appearance of left chest, NAD.Marland Kitchen. ~  CXR 8/11 showed post op changes & scarring, COPD, NAD.Marland Kitchen. ~  CXR 6/12 in  [place] showed chr changes w/ scarring & blunt angle on left, hyperinflation on right, diffuse osteopenia, NAD.Marland Kitchen. ~  CXR 5/13 showed stable post-op changes/ vol loss/ scarring on left, atherosclerotic calcif in Ao, COPD changes, NAD.Marland Kitchen. ~  CXR 5/14 shows norm heart size, prior surg & sm lung vol w/ scarring on left, hyperinflation on right, NAD... ~  10/14:  Hosp w/ Left lung pneumonia, required bronch & lavage from DrAlva=> Pseudomonas (pan-sens) & treated w/ Zosyn=> po Levaquin750/d; sent to [facility] for rehab... ~  CT Chest 10/14 showed consolidation in left lung w/ endobronch debris, dil of the ascend ThorAo, nlarged main PA, coronary calcif, no adenopathy... ~  11/14:  Post-hosp check & improved clinically & CXR w/ better aeration/ improved; daugh wants nebs Tid regularly- ok...  Hx of CARCINOMA, LUNG, SQUAMOUS CELL (ICD-162.9) - s/p left thoracotomy w/ LLLobectomy for squamous cell ca 10/99 by DrBurney... no known recurrence.  GERD (ICD-530.81) - prev on Prevacid OTC... last EGD was 11/99 showing GERD, otherw neg... 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  note: she states the Prevacid really helps and the generic Omeprazole didn't help... ~  8/13:  We added ZOFRAN 4mg  Q4H as needed for nausea... ~  11/13:  She requests to incr her PREVACID 15mg  to Bid- OK...  IBS- Constipation  >>  on BENTYL 20mg  prn...last colonoscopy was 7/03 by DrPerry & was normal- no pathologic findings... ~  5/13:  Asked to start Miralax 1 capful in water daily...  GALLSTONES (ICD-574.20)  Hx of ALCOHOLISM >>  UNDERWEIGHT >>  PROTEIN-CALORIE MALNUTRITION >>  ~  [facility] 10/14 w/ Pseudomonas Pneumonia and Alb ranged 2.1 - 2.5; we reviewed Megace Qid for appetitie and Nutritional supplements for  wt gain...  Hx of ADENOCARCINOMA, BREAST (ICD-174.9) - DrMagrinat stopped her Tamoxifen after 36yrs and released her on 12/08... she had a left breast  lumpectomy and sentinel node biopsy 12/03 by [facility] for a 2.2cm infiltrating carcinoma, neg LN's, ER/PR pos, treated w/ XRT, then Tamoxifen for 67yrs... ~  Mammogram 5/09 was negative... ~  Mammogram 5/10 at [facility] was neg- fatty replacement, post-op changes... ~  She is overdue for f/u mammogram & will call [facility] at her convenience- reminded again...  DEGENERATIVE JOINT DISEASE (ICD-715.90) - she's had a prev lumbar laminectomy & a fractured left hip after a fall (repaired by DrDuda [id number])... ~  [place] CT[place] 6/12 after fall showed severe DDD & facet dis, and anterolisthesis at several levels... ~  Fall at home 6/12 w/ fx right humerus & fx right patella> treated by DrDuda in hosp & NH rehab...  OSTEOPOROSIS (ICD-733.00) - she was prev on Boniva150mg /month along w/ calcium and vitD supplements==> given IV RECLAST 05/07/12. ~  BMD 8/08 shows severe osteoporosis w/ TScores -3.5 in the spine and -3.7 in the hip...  improved from 5/06 study! ~  Vit D level 3/09 = 30 & 50K/wk Rx started...  ~  Vit D level 9/09 = 66 & switched to 1000u OTC daily... ~  BMD 10/10 showed TScores -3.3 Spine, and -3.1 in right fem neck... sl improved from 2008, continue Rx, avoid trauma. ~  Labs in [facility] 6/12 included Vit D level = 68 ~  Labs 5/13 showed VitD level = 61 ~  5/13:  She does not want to take Boniva any longer & is asking for IV med alternative> we will look into RECLAST eligibility for her==> given 1st dose IV RECLAST 05/07/12... ~  BMD 5/14 showed TScores -3.6 in Spine and -3.5 in [facility]; she was given 5mg  IV Reclast on 05/13/13...                        NEUROPATHY (ICD-355.9) - on NEURONTIN 300mg  Tid... it really helps her back pain.  Early Dementia >> ~  CT Brain 6/12 in ER after fall showed atrophy & chr sm vessel dis, NAD...  ANXIETY (ICD-300.00) - on LIBRIUM 10mg tid, ZOLOFT 100mg /d and REMERON 15mg Qhs... she wishes to continue all of these the same.  PSORIASIS (ICD-696.1) -  treated by  Elnora Morrison w/ MTX- intol pills, prev on shots... ~  5/14:  She requests refill Triamcinolone cream now that Elnora Morrison has retired...  ANEMIA (ICD-285.9) - she is rec to take Women's MVI, Vit B12 1035mcg/d, Vit D 1000 u/d... ~  labs 3/09 showed Hg= 11.9 w/ Fe= 67... started Feosol 200mg /d... ~  labs 9/09 showed Hg= 13.3 w/ Fe= 84 ~  labs 9/10 showed Hg= 13.3, Fe= 111... pt stopped the Fe supplement. ~  labs 8/11 showed Hg= 12.9, Fe= 94 (23%sat), B12= 302 ~  Labs 6/12 in [place] showed Hg= 12.9==>10.4 ~  Labs 5/13 showed Hg= 13.3 ~  Labs 5/14 showed Hg= 13.9 ~  Labs 10/14 in [place] w/ pneumonia> Hg dropped to 9.7.Marland KitchenMarland Kitchen  HEALTH MAINTENANCE:  She takes a lot of Vitamins> B12, D, E, MVI, calcium etc... Plus nutritional supplements- Ensure, Boost, etc...   Past Surgical History  Procedure Laterality Date  . Lumbar laminectomy    . 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 Left lung surgery with lllobectomy for squamous cell cancer  09/1998    Dr. Edwyna Shell  . Left breast lumpectomy with sentinel  lymph node biopsy    . Left femur fracture repaired  04/2004    Dr. Lajoyce Corners  . Video bronchoscopy Bilateral 09/19/2013    Procedure: VIDEO BRONCHOSCOPY WITH FLUORO;  Surgeon: Oretha Milch, MD;  Location: [facility];  Service: Cardiopulmonary;  Laterality: Bilateral;    Outpatient Encounter Prescriptions as of 10/19/2013  Medication Sig  . albuterol (PROVENTIL) (2.5 MG/3ML) 0.083% nebulizer solution Use 1 vial in nebulizer up to 3 times daily as needed FILE UNDER MCR PART B  . albuterol (PROVENTIL) (2.5 MG/3ML) 0.083% nebulizer solution Take 3 mLs (2.5 mg total) by nebulization 2 (two) times daily.  . calcium carbonate (OS-CAL) 600 MG TABS Take 600 mg by mouth 2 (two) times daily with a meal.    . chlordiazePOXIDE (LIBRIUM) 10 MG capsule Take 10 mg by mouth 3 (three) times daily.  . cholecalciferol (VITAMIN D) 1000 UNITS tablet Take 1,000 Units by mouth daily.   . cyanocobalamin  2000 MCG tablet Take 2,000 mcg by mouth daily.    Marland Kitchen dicyclomine (BENTYL) 20 MG tablet TAKE 1 TABLET BY MOUTH 3 TIMES A DAY AS NEEDED FOR CRAMPING  . Fluticasone-Salmeterol (ADVAIR DISKUS) 250-50 MCG/DOSE AEPB INHALE 1 PUFF TWICE A DAY  . gabapentin (NEURONTIN) 300 MG capsule TAKE ONE CAPSULE BY MOUTH 3 TIMES A DAY  . lansoprazole (PREVACID) 15 MG capsule Take 1 capsule (15 mg total) by mouth 2 (two) times daily.  . meclizine (ANTIVERT) 25 MG tablet Take 1/2 to 1 tablet by mouth every 4 hours as needed for dizziness  . megestrol (MEGACE) 40 MG/ML suspension Take 800 mg by mouth daily.  . mirtazapine (REMERON) 15 MG tablet Take 1 tablet (15 mg total) by mouth at bedtime as needed.  . Multiple Vitamins-Minerals (CENTRUM SILVER PO) Take 1 tablet by mouth daily.    . ondansetron (ZOFRAN) 4 MG tablet Take 1 tablet (4 mg total) by mouth every 4 (four) hours as needed for nausea.  . sertraline (ZOLOFT) 100 MG tablet Take 1 tablet (100 mg total) by mouth daily.  . traMADol (ULTRAM) 50 MG tablet Take 50 mg by mouth every 6 (six) hours as needed for pain.  Marland Kitchen triamcinolone cream (KENALOG) 0.1 % Apply 1 application topically 2 (two) times daily. To affected area  . vitamin E 400 UNIT capsule Take 400 Units by mouth daily.      No Known Allergies   Current Medications, Allergies, Past Medical History, Past Surgical History, Family History, and Social History were reviewed in [employer] record.    Review of Systems        See HPI - all other systems neg except as noted...  The patient complains of dyspnea on exertion and muscle weakness.  The patient denies anorexia, fever, weight loss, weight gain, vision loss, decreased hearing, hoarseness, chest pain, syncope, peripheral edema, prolonged cough, headaches, hemoptysis, abdominal pain, melena, hematochezia, severe indigestion/heartburn, hematuria, incontinence, suspicious skin lesions, transient blindness, difficulty walking,  depression, unusual weight change, abnormal bleeding, enlarged lymph nodes, and angioedema.     Objective:   Physical Exam    WD, Thin, 79 y/o WF in NAD... she is chr ill appearing... GENERAL:  Alert & oriented; pleasant & cooperative... HEENT:  [place]/AT, EOM-wnl, PERRLA, EACs-clear, TMs-wnl, NOSE-clear, THROAT-clear & wnl. NECK:  Supple w/ fairROM; no JVD; normal carotid impulses w/o bruits; no thyromegaly or nodules palpated; no lymphadenopathy. CHEST:  Decr BS on left; prev left thoracotomy scar; without wheezes/ rales/ or rhonchi heard... HEART:  Regular  Rhythm; without murmurs/ rubs/ or gallops detected... ABDOMEN:  Soft & nontender; normal bowel sounds; no organomegaly or masses palpated... EXT: without deformities, mild arthritic changes; no varicose veins/ venous insuffic/ or edema. NEURO:  CN's intact; no focal neuro deficits x mild neuropathy... DERM:  No lesions noted; no rash etc...  RADIOLOGY DATA:  Reviewed in the EPIC EMR & discussed w/ the patient...  LABORATORY DATA:  Reviewed in the EPIC EMR & discussed w/ the patient...   Assessment & Plan:    COPD/ Emphysema/ Hx Pseudomonas pneumonia 10/14>  On NEBS, Advair, Mucinex; continue same & use NEBS regularly; cautiously incr activity; avoid pollen & get air filter for bedroom etc...  Hx Lung Cancer>  S/p LLLobectomy 1999 & no known recurrence; CXRs and scans reviewed...  GERD>  On OTC PPI Rx as needed; she denies swallowing difficulty etc, just poor appetite; rec supplements...  IBS>  On Bentyl & Miralax as needed...  Hx Breast Cancer>  Left breast ca w/ surg 2003, then Tamoxifen & released by DrMagrinat in 2008; no known recurrence to date...  DJD>  Fall at home 6/12 w/ Fx rt arm & patella; attended by DrDuda & improved toward baseline; she uses Tylenol/ Tramadol for pain...  Osteoporosis>  Prev on Boniva, but she received her 1st dose of IV Reclast 05/07/12 & her 2nd dose 05/13/13; BMD showed TScores -3.6 in spine & -3.5  in FemNecks...  Neuro> Dementia, Neuropathy>  On Neurontin, we reviewed [facility]rain [facility]...  Anxiety>  She remains quite anxious & well cared for by her daughter; continue same meds...  Anemia>  Hg now down to 9.7 aafter 10/14 hosp...   Patient's Medications  New Prescriptions   No medications on file  Previous Medications   ALBUTEROL (PROVENTIL) (2.5 MG/3ML) 0.083% NEBULIZER SOLUTION    Use 1 vial in nebulizer up to 3 times daily as needed FILE UNDER MCR PART B   ALBUTEROL (PROVENTIL) (2.5 MG/3ML) 0.083% NEBULIZER SOLUTION    Take 3 mLs (2.5 mg total) by nebulization 2 (two) times daily.   CALCIUM CARBONATE (OS-CAL) 600 MG TABS    Take 600 mg by mouth 2 (two) times daily with a meal.     CHLORDIAZEPOXIDE (LIBRIUM) 10 MG CAPSULE    Take 10 mg by mouth 3 (three) times daily.   CHOLECALCIFEROL (VITAMIN D) 1000 UNITS TABLET    Take 1,000 Units by mouth daily.    CYANOCOBALAMIN 2000 MCG TABLET    Take 2,000 mcg by mouth daily.     DICYCLOMINE (BENTYL) 20 MG TABLET    TAKE 1 TABLET BY MOUTH 3 TIMES A DAY AS NEEDED FOR CRAMPING   FERROUS SULFATE 325 (65 FE) MG TABLET    Take 325 mg by mouth daily with breakfast.   FLUTICASONE-SALMETEROL (ADVAIR DISKUS) 250-50 MCG/DOSE AEPB    INHALE 1 PUFF TWICE A DAY   GABAPENTIN (NEURONTIN) 300 MG CAPSULE    TAKE ONE CAPSULE BY MOUTH 3 TIMES A DAY   LANSOPRAZOLE (PREVACID) 15 MG CAPSULE    Take 1 capsule (15 mg total) by mouth 2 (two) times daily.   MECLIZINE (ANTIVERT) 25 MG TABLET    Take 1/2 to 1 tablet by mouth every 4 hours as needed for dizziness   MEGESTROL (MEGACE) 40 MG/ML SUSPENSION    Take 200 mg by mouth 4 (four) times daily.    MIRTAZAPINE (REMERON) 15 MG TABLET    Take 1 tablet (15 mg total) by mouth at bedtime as needed.  MULTIPLE VITAMINS-MINERALS (CENTRUM SILVER PO)    Take 1 tablet by mouth daily.     ONDANSETRON (ZOFRAN) 4 MG TABLET    Take 1 tablet (4 mg total) by mouth every 4 (four) hours as needed for nausea.   SERTRALINE  (ZOLOFT) 100 MG TABLET    Take 1 tablet (100 mg total) by mouth daily.   TRAMADOL (ULTRAM) 50 MG TABLET    Take 50 mg by mouth every 6 (six) hours as needed for pain.   TRIAMCINOLONE CREAM (KENALOG) 0.1 %    Apply 1 application topically 2 (two) times daily. To affected area   VITAMIN E 400 UNIT CAPSULE    Take 400 Units by mouth daily.    Modified Medications   No medications on file  Discontinued Medications   No medications on file

## 2013-11-02 LAB — AFB CULTURE WITH SMEAR (NOT AT ARMC)

## 2013-11-10 ENCOUNTER — Telehealth: Payer: Self-pay | Admitting: Pulmonary Disease

## 2013-11-10 NOTE — Telephone Encounter (Signed)
Pt's daughter is asking to speak w/ Leigh.  Would like to know if you have spoken to SN yet.  Depending on what SN says, may need to call other doctor before 5:00 today.  Antionette Fairy

## 2013-11-10 NOTE — Telephone Encounter (Signed)
Per SN---  recs would be IM rocephin is fine but the pseudomonas that she grew the last time in 09/2013 was sensitive to cipro.   SN would rec to add cipro 500 mg  Bid x 10 days.   i called and spoke with paula and she is aware of SN recs.  She is going to call and talk with Dr. Jarold Motto that saw the pt earlier today to see what tx he is planning on doing.  She will call back and let us know tomorrow what the tx plan will be just to keep SN up to date on the pt.

## 2013-11-10 NOTE — Telephone Encounter (Signed)
Called and spoke with pts daughter Leslie [name]---  Pt was in the hospital back in October and was seen by Dr. Vassie [name].  They did the cxr and labs and pt was dx with psuedomonas.  She was started on IV rocephin and the pt did not respond to this treatment so she was changed to zosyn for 2 weeks then on oral abx x 1 week.  Leslie [name] stated that the pt was then transferred to [facility].  Family has noticed in the last 2 wks pt has been having increase in confusion and just acting funny at times. Leslie [name] stated that Dr. Gerald [name] doctor for the facility--came in yesterday and saw the pt and he ordered the cxr and labs.  pts WBC is now at 13,000 and her cxr showed an area in the LLL.  Dr. Jarold [name] wants to treat the pt with IM rocephin so the daughter wanted to call and see what SN would rec.  Leslie [name] feels that the pt should be in the hospital for treatment of this since she had this just 2 months ago.  SN please advise. Thanks  No Known Allergies

## 2013-11-23 ENCOUNTER — Other Ambulatory Visit: Payer: Self-pay | Admitting: Pulmonary Disease

## 2013-11-23 ENCOUNTER — Telehealth: Payer: Self-pay | Admitting: Pulmonary Disease

## 2013-11-23 DIAGNOSIS — J449 Chronic obstructive pulmonary disease, unspecified: Secondary | ICD-10-CM

## 2013-11-23 NOTE — Telephone Encounter (Signed)
I spoke with pt. Daughter. She reports pt has been on 10 days of IV abx. Pt still does not seem her self. She is scheduled to come in and see SN on Friday. Nothing further needed

## 2013-11-25 ENCOUNTER — Ambulatory Visit (INDEPENDENT_AMBULATORY_CARE_PROVIDER_SITE_OTHER): Payer: Medicare Other | Admitting: Pulmonary Disease

## 2013-11-25 ENCOUNTER — Ambulatory Visit (INDEPENDENT_AMBULATORY_CARE_PROVIDER_SITE_OTHER)
Admission: RE | Admit: 2013-11-25 | Discharge: 2013-11-25 | Disposition: A | Discharge status: Home / Self Care | Payer: Medicare Other | Source: Ambulatory Visit | Attending: Pulmonary Disease | Admitting: Pulmonary Disease

## 2013-11-25 ENCOUNTER — Encounter: Payer: Self-pay | Admitting: Pulmonary Disease

## 2013-11-25 VITALS — BP 122/76 | HR 88 | Temp 98.3°F | Wt 87.2 lb

## 2013-11-25 DIAGNOSIS — J449 Chronic obstructive pulmonary disease, unspecified: Secondary | ICD-10-CM

## 2013-11-25 DIAGNOSIS — M810 Age-related osteoporosis without current pathological fracture: Secondary | ICD-10-CM

## 2013-11-25 DIAGNOSIS — C50919 Malignant neoplasm of unspecified site of unspecified female breast: Secondary | ICD-10-CM

## 2013-11-25 DIAGNOSIS — D649 Anemia, unspecified: Secondary | ICD-10-CM

## 2013-11-25 DIAGNOSIS — J151 Pneumonia due to Pseudomonas: Secondary | ICD-10-CM

## 2013-11-25 DIAGNOSIS — R413 Other amnesia: Secondary | ICD-10-CM

## 2013-11-25 DIAGNOSIS — F411 Generalized anxiety disorder: Secondary | ICD-10-CM

## 2013-11-25 DIAGNOSIS — C3490 Malignant neoplasm of unspecified part of unspecified bronchus or lung: Secondary | ICD-10-CM

## 2013-11-25 DIAGNOSIS — E43 Unspecified severe protein-calorie malnutrition: Secondary | ICD-10-CM

## 2013-11-25 DIAGNOSIS — M1990 Unspecified osteoarthritis, unspecified site: Secondary | ICD-10-CM

## 2013-11-25 DIAGNOSIS — J9600 Acute respiratory failure, unspecified whether with hypoxia or hypercapnia: Secondary | ICD-10-CM

## 2013-11-25 DIAGNOSIS — J9601 Acute respiratory failure with hypoxia: Secondary | ICD-10-CM

## 2013-11-25 DIAGNOSIS — K589 Irritable bowel syndrome without diarrhea: Secondary | ICD-10-CM

## 2013-11-25 DIAGNOSIS — K219 Gastro-esophageal reflux disease without esophagitis: Secondary | ICD-10-CM

## 2013-11-25 NOTE — Progress Notes (Signed)
Subjective:    Patient ID: Leslie [name], female    DOB: Feb 15, 1929, 79 y.o.   MRN: [id number]  HPI 79 y/o WF here for a 6 month follow up visit... she has mult medical problems as listed...   ~  Apr 09, 2012:  102mo ROV & she is c/o feeling bad, more SOB, all due to the pollen she thinks; on NEBS, Advair, & Albut HFA rescue inhaler; reminded to use NEBS 2-4x daily & the Advair Bid every day (the rescue inhaler is just for prn use when out & about); asked to get air filter at home to decr pollen exposure...    She has DJD & LBP w/ prev surg; she fell 6/12 w/ fx right humerus & patella- Rx DrDuda; she has been on Boniva for her severe Osteoporosis & wants to stop this & switch to IV med- we will see if she is elig & cost of RECLAST for her...  CXR 5/13 showed stable post-op changes/ vol loss/ scarring on left, atherosclerotic calcif in Ao, COPD changes, NAD... LABS 5/13:  Chems- wnl;  CBC- wnl;  TSH=2.45;  VitD=61;  VitB12>1500...  ~  July 23, 2012:  45mo ROV & add-on appt for dizziness> they report onset of ?dizziness & nausea w/o vomit yest x1d; pt states "just liked to fall- I can't explain it, it scared me";  She denies focal weakness, facial changes, slurred speech, or sensory changes;  She denies CP, palpit, syncope, etc;  She has hx of "inner ear" w/ similar symptoms yrs ago;  Family wondered if symptoms could come from poor appetite & poor intake, perhaps hypoglycemia as she seemed to do better after Boost...    We discussed Rx w/ Meclizine prn & Zofran for the nausea;  She did not want to pursue Cardiac arrhythmia or Neuro evaluations at this point...    Family did request appetite stimulant & we wrote for MEGACE 40mg /ml> 1 tsp po Qid for appetite; plus 6 sm meals daily (may use Ensure/ Boost betw N/ L/ D & bedtime...  ~  October 15, 2012:  45mo ROV & [name] has gained 2# to 82# today taking Megace but just 1tsp daily (rec to incr to Qid as prescribed); she also notes that Meclizine has helped  dizziness, & Zofran helped the nausea; she wants to incr her Prevacid 15mg  to Bid- ok...    COPD, Hx LungCa, s/p LLLobectomy 1999> on Advair250, NEBS, Mucinex; CXR 5/13 w/ chr post op changes, NAD...    GERD> on Prev15; wants to incr to Bid as above- ok...    IBS w/ Constip> on Bentyl & Miralax but won't take it regularly as advised...    Hx Breast Ca> she has been off Tamoxifen since 2008; she needs to get her f[facility]u mamm[facility]...    DJD, Osteoporosis> s/p LLam, left hip fx, right humerus fx, right patella fx, & severe DDD; Ortho per DrDuda; prev on boniva, now Recleast w/ infusion 5/13...    Dementia, Neuropathy> CT w/ atrophy 7 chr sm vessel dis; on Neurontin 300Tid    Anxiety> hx Etoh in past, on Librium, Zoloft, Remeron...    Psoriasis> followed by DrFHouston... We reviewed prob list, meds, xrays and labs> see below for updates >>   ~  Apr 15, 2013:  102mo ROV & Leilanee says she feels OK, appetite is fair & helped by Megace but her weight is down 5# to 76# at present (BMI=15); she is asked to take the Megace every  day & incr her nutritional supplements to Tid as well, must gain some weight... We reviewed the following medical problems during today's office visit >>     COPD, Hx LungCa, s/p LLLobectomy 1999> on Advair250, NEBS, Mucinex; denies CP, palpit, SOB, edema; CXR 5/14 is stable w/ decr lung vol on left, scarring unchanged, NAD...     GERD> on Prev15Bid, Zofran4 prn; she still has intermit nausea that responds to Zofran; needs to gain weight & this may help...    IBS w/ Constip> on Bentyl & Miralax but won't take it regularly as advised...    Underweight> she has never been large but BMI is 15-16 & needs to gain wt; she has MegaceQid & asked to incr nutritional supplements to Tid...    Hx Breast Ca> she has been off Tamoxifen since 2008; she needs to get her f/u mamm[facility] at [facility] but she hasn't done this yet...    DJD, Osteoporosis> on Tramadol prn; s/p LLam, left hip fx, right  humerus fx, right patella fx, & severe DDD; Ortho per Tyna Jaksch; prev on Boniva, now Reclast w/ infusion 5/13; BMD due now & next Reclast infusion pending...    Dementia, Neuropathy> CT w/ atrophy & chr sm vessel dis; on Neurontin 300Tid which helps her back pain she says...    Anxiety> hx Etoh in past, on Librium, Zoloft, Remeron; and her daughter helps w/ her meds...    Psoriasis> followed by DrFHouston (now retired) & she is requesting refill of Triamcinolone cream... We reviewed prob list, meds, xrays and labs> see below for updates >>  CXR 5/14 shows norm heart size, prior surg & sm lung vol w/ scarring on left, hyperinflation on right, NAD... LABS 5/14:  Chems- wnl;  CBC- wnl;  TSH=2.30;  VitD=51...  BMD 5/14=> pending  ~  October 19, 2013:  41mo ROV & post hospital check> she was [facility] 10/8 - 09/26/13 by [facility] w/ consult by Tedd Sias for Pulm; she had dense left pneumonia which was slow to clear despite broad spectrum antibiotics; Bronch 10/13 yielded Pseudomoas that was pan-sens and initial Rochepin/Zithro=> changed to Zosyn/Vanco=> & later to Levaquin 750 at discharge... XRay improved but signif resid LLL vol loss & infiltrate remained... Since disch she has improved in the NH for rehab ([facility]) but daugh has lots of concerns including the fact that she is not getting her NEBS regularly & wonders if she can wean off the Oxygen...     COPD, Hx LungCa, s/p LLLobectomy 1999 & Pseudomonas pneumonia 10/14> on Advair250Bid, NEBS w/ AlbutTid (needs this regularly), Mucinex, etc; denies CP, palpit, ch in SOB, edema; Hosp 10/14 w/ Pseudomonas pneum in left lung, dense consolid, required bronch & adjust in antibiotics to get improvement, disch to [facility] for rehab=> finished Levaquin, we will incr Nebs to regular dosing!    GERD> on Prev15Bid, Zofran4 prn; she still has intermit nausea that responds to Zofran; needs to gain weight & this may help...    IBS w/ Constip> on Bentyl & Miralax but won't take it  regularly as advised...    Underweight> she has never been large but BMI is 15-16 & needs to gain wt; she has MegaceQid & asked to incr nutritional supplements to Tid...    Hx Breast Ca> she has been off Tamoxifen since 2008; she needs to get her f/u mammogram at [facility] but she hasn't done this yet...    DJD, Osteoporosis> on Tramadol prn; s/p LLam, left hip fx, right humerus fx, right patella fx, &  severe DDD; Ortho per Tyna Jaksch; prev on Boniva, now Reclast w/ infusion 5/13; BMD f/u 5/14 showed TScores -3.6 in Spine and -3.5 in [facility]; she was given 5mg  IV Reclast on 05/13/13...    Dementia, Neuropathy> CT w/ atrophy & chr sm vessel dis; on Neurontin 300Tid which helps her back pain she says...    Anxiety> hx Etoh in past, on Librium, Zoloft, Remeron; and her daughter helps w/ her meds prior to the NH...    Psoriasis> followed by DrFHouston (now retired) & she is requesting refill of Triamcinolone cream... We reviewed prob list, meds, xrays and labs> see below for updates >>  2DEcho 10/14 in hosp showed norm LV size & function w/ EF=60-65%, norm wall motion, Gr1DD, mild AI, mild post leaflet MVP, otherw neg... CXR 11/14 shows further improvement in left lung aeration w/ some resid atelec, infiltrate, scarring, & vol loss at the left base...   ~  November 25, 2013:  43mo ROV & add-on appt> pt seen 10/19/13 s/p hosp for severe left pneumonia w/ lavage fluid +Pseudomonas (pan sens)- treated w/ Zosyn & disch on Levaquin; when seen 11/12 she was improved at a new baseline, in NH for rehab, on NEBS w/ AlbutTid & Advair250Bid; they called 12/4 w/ incr confusion, CXR in NH reported to show "an area in LLL", WBC=13K; DrPaterson rec Rocephin but based on prev cult I rec adding Cipro500Bid; NH records indicate she received Rocephin & Vanco IV from 12/4-12/13; she is still congested w/ left basilar rhonchi, some exp wheezing, scant beige sput w/o blood; Temp here is 98.3; family is still concerned & we repeated CXR  today= same as 10/19/13 film w/ stable LLL aeration, post op changes on left, scarring left base, clear right lung;  I suspect she has bronchiectasis in the left base and as such the broad spectrum antibiotic Rx may have helped; suggest obtaining sput for C&S, continue Advair250Bid & NEBS w/ AlbutTid, add Mucinex600-2Bid, Fluids, and Prednisone starting w/ 20mg /d & tapering slowly to 10Qod...           Problem List:      HEARING LOSS (ICD-389.9) - she refuses hearing eval...  DIZZINESS >> we started MECLIZINE 25mg - 1/2 to 1 tab every 4H as needed...  COPD (ICD-496) - ex-smoker... on ALBUT NEBS up to Qid, ADVAIR 250Bid, & Prn Mucinex... PSEUDOMONAS PNEUMONIA in LLL 10/14 >>  ~  CTChest 6/06 w/ stable post-op changes on left, no recurrence, scarring RLL, sm gallstones... ~  CXR 8/08 w/ vol loss & post-op changes on left, right clear...  ~  CXR 9/09 showed stable post op changes on the left, NAD.Marland Kitchen. ~  CXR 9/10 unchanged- stable post op appearance of left chest, NAD.Marland Kitchen. ~  CXR 8/11 showed post op changes & scarring, COPD, NAD.Marland Kitchen. ~  CXR 6/12 in [place] showed chr changes w/ scarring & blunt angle on left, hyperinflation on right, diffuse osteopenia, NAD.Marland Kitchen. ~  CXR 5/13 showed stable post-op changes/ vol loss/ scarring on left, atherosclerotic calcif in Ao, COPD changes, NAD.Marland Kitchen. ~  CXR 5/14 shows norm heart size, prior surg & sm lung vol w/ scarring on left, hyperinflation on right, NAD... ~  10/14:  Hosp w/ Left lung pneumonia, required bronch & lavage from DrAlva=> Pseudomonas (pan-sens) & treated w/ Zosyn=> po Levaquin750/d; sent to [facility] for rehab... ~  CT Chest 10/14 showed consolidation in left lung w/ endobronch debris, dil of the ascend ThorAo, nlarged main PA, coronary calcif, no adenopathy... ~  11/14:  Post-hosp check &  improved clinically & CXR w/ better aeration/ improved; daugh wants nebs Tid regularly- ok... ~  12/14: add-on after getting IV Rocephin & Vanco in NH for sl confusion,  WBC=13K, and "an area in the LLL on CXR; clinically improved but w/ resid congestion, cough, etc; CXR at baseline w/ LLL atx, scarring; Rec to check sput, Add Mucinex, Pred taper... ~  CXR 12/14 showed vol loss on left, scarring/atx left base & clips from prev surg, clear right...  Hx of CARCINOMA, LUNG, SQUAMOUS CELL (ICD-162.9) - s/p left thoracotomy w/ LLLobectomy for squamous cell ca 10/99 by DrBurney... no known recurrence.  GERD (ICD-530.81) - prev on Prevacid OTC... last EGD was 11/99 showing GERD, otherw neg... 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  note: she states the Prevacid really helps and the generic Omeprazole didn't help... ~  8/13:  We added ZOFRAN 4mg  Q4H as needed for nausea... ~  11/13:  She requests to incr her PREVACID 15mg  to Bid- OK...  IBS- Constipation  >>  on BENTYL 20mg  prn...last colonoscopy was 7/03 by DrPerry & was normal- no pathologic findings... ~  5/13:  Asked to start Miralax 1 capful in water daily...  GALLSTONES (ICD-574.20)  Hx of ALCOHOLISM >>  UNDERWEIGHT >>  PROTEIN-CALORIE MALNUTRITION >>  ~  [facility] 10/14 w/ Pseudomonas Pneumonia and Alb ranged 2.1 - 2.5; we reviewed Megace Qid for appetitie and Nutritional supplements for wt gain...  Hx of ADENOCARCINOMA, BREAST (ICD-174.9) - DrMagrinat stopped her Tamoxifen after 15yrs and released her on 12/08... she had a left breast lumpectomy and sentinel node biopsy 12/03 by [facility] for a 2.2cm infiltrating carcinoma, neg LN's, ER/PR pos, treated w/ XRT, then Tamoxifen for 106yrs... ~  Mammogram 5/09 was negative... ~  Mammogram 5/10 at [facility] was neg- fatty replacement, post-op changes... ~  She is overdue for f/u mammogram & will call [facility] at her convenience- reminded again...  DEGENERATIVE JOINT DISEASE (ICD-715.90) - she's had a prev lumbar laminectomy & a fractured left hip after a fall (repaired by DrDuda [id number])... ~   [place] CTNeck 6/12 after fall showed severe DDD & facet dis, and anterolisthesis at several levels... ~  Fall at home 6/12 w/ fx right humerus & fx right patella> treated by DrDuda in hosp & NH rehab...  OSTEOPOROSIS (ICD-733.00) - she was prev on Boniva150mg /month along w/ calcium and vitD supplements==> given IV RECLAST 05/07/12. ~  BMD 8/08 shows severe osteoporosis w/ TScores -3.5 in the spine and -3.7 in the hip...  improved from 5/06 study! ~  Vit D level 3/09 = 30 & 50K/wk Rx started...  ~  Vit D level 9/09 = 66 & switched to 1000u OTC daily... ~  BMD 10/10 showed TScores -3.3 Spine, and -3.1 in right fem neck... sl improved from 2008, continue Rx, avoid trauma. ~  Labs in [facility] 6/12 included Vit D level = 68 ~  Labs 5/13 showed VitD level = 61 ~  5/13:  She does not want to take Boniva any longer & is asking for IV med alternative> we will look into RECLAST eligibility for her==> given 1st dose IV RECLAST 05/07/12... ~  BMD 5/14 showed TScores -3.6 in Spine and -3.5 in [facility]; she was given 5mg  IV Reclast on 05/13/13...                        NEUROPATHY (ICD-355.9) - on NEURONTIN 300mg   Tid... it really helps her back pain.  Early Dementia >> ~  CT Brain 6/12 in ER after fall showed atrophy & chr sm vessel dis, NAD...  ANXIETY (ICD-300.00) - on LIBRIUM 10mg tid, ZOLOFT 100mg /d and REMERON 15mg Qhs... she wishes to continue all of these the same.  PSORIASIS (ICD-696.1) -  treated by Elnora Morrison w/ MTX- intol pills, prev on shots... ~  5/14:  She requests refill Triamcinolone cream now that Elnora Morrison has retired...  ANEMIA (ICD-285.9) - she is rec to take Women's MVI, Vit B12 105mcg/d, Vit D 1000 u/d... ~  labs 3/09 showed Hg= 11.9 w/ Fe= 67... started Feosol 200mg /d... ~  labs 9/09 showed Hg= 13.3 w/ Fe= 84 ~  labs 9/10 showed Hg= 13.3, Fe= 111... pt stopped the Fe supplement. ~  labs 8/11 showed Hg= 12.9, Fe= 94 (23%sat), B12= 302 ~  Labs 6/12 in [place] showed Hg= 12.9==>10.4 ~  Labs  5/13 showed Hg= 13.3 ~  Labs 5/14 showed Hg= 13.9 ~  Labs 10/14 in [place] w/ pneumonia> Hg dropped to 9.7.Marland KitchenMarland Kitchen  HEALTH MAINTENANCE:  She takes a lot of Vitamins> B12, D, E, MVI, calcium etc... Plus nutritional supplements- Ensure, Boost, etc...   Past Surgical History  Procedure Laterality Date  . Lumbar laminectomy    . 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 Left lung surgery with lllobectomy for squamous cell cancer  09/1998    Dr. Edwyna Shell  . Left breast lumpectomy with sentinel lymph node biopsy    . Left femur fracture repaired  04/2004    Dr. Lajoyce Corners  . Video bronchoscopy Bilateral 09/19/2013    Procedure: VIDEO BRONCHOSCOPY WITH FLUORO;  Surgeon: Oretha Milch, MD;  Location: [facility];  Service: Cardiopulmonary;  Laterality: Bilateral;    Outpatient Encounter Prescriptions as of 11/25/2013  Medication Sig  . acetaminophen (TYLENOL) 325 MG tablet Take 325 mg by mouth 4 (four) times daily as needed.  Marland Kitchen albuterol (PROVENTIL) (2.5 MG/3ML) 0.083% nebulizer solution Use 1 vial in nebulizer up to 3 times daily as needed FILE UNDER MCR PART B  . calcium carbonate (OS-CAL) 600 MG TABS Take 600 mg by mouth 2 (two) times daily with a meal.    . chlordiazePOXIDE (LIBRIUM) 10 MG capsule Take 10 mg by mouth 3 (three) times daily.  . cholecalciferol (VITAMIN D) 1000 UNITS tablet Take 1,000 Units by mouth daily.   . cyanocobalamin 2000 MCG tablet Take 2,000 mcg by mouth daily.    Marland Kitchen dicyclomine (BENTYL) 20 MG tablet TAKE 1 TABLET BY MOUTH 3 TIMES A DAY AS NEEDED FOR CRAMPING  . ferrous sulfate 325 (65 FE) MG tablet Take 325 mg by mouth daily with breakfast.  . Fluticasone-Salmeterol (ADVAIR DISKUS) 250-50 MCG/DOSE AEPB INHALE 1 PUFF TWICE A DAY  . gabapentin (NEURONTIN) 300 MG capsule TAKE ONE CAPSULE BY MOUTH 3 TIMES A DAY  . lansoprazole (PREVACID) 15 MG capsule Take 1 capsule (15 mg total) by mouth 2 (two) times daily.  . meclizine (ANTIVERT) 25 MG tablet Take 1/2  to 1 tablet by mouth every 4 hours as needed for dizziness  . megestrol (MEGACE) 40 MG/ML suspension Take 200 mg by mouth 4 (four) times daily.   . mirtazapine (REMERON) 15 MG tablet Take 1 tablet (15 mg total) by mouth at bedtime as needed.  . Multiple Vitamins-Minerals (CENTRUM SILVER PO) Take 1 tablet by mouth daily.    . ondansetron (ZOFRAN) 4 MG tablet Take 1 tablet (4 mg total) by mouth  every 4 (four) hours as needed for nausea.  . Probiotic Product (ALIGN) 4 MG CAPS Take 1 capsule by mouth daily.  . sertraline (ZOLOFT) 100 MG tablet Take 150 mg by mouth daily.  . traMADol (ULTRAM) 50 MG tablet Take 50 mg by mouth every 6 (six) hours as needed for pain.  Marland Kitchen triamcinolone cream (KENALOG) 0.1 % Apply 1 application topically 2 (two) times daily. To affected area  . vitamin E 400 UNIT capsule Take 400 Units by mouth daily.    . [DISCONTINUED] sertraline (ZOLOFT) 100 MG tablet Take 1 tablet (100 mg total) by mouth daily.  . [DISCONTINUED] albuterol (PROVENTIL) (2.5 MG/3ML) 0.083% nebulizer solution Take 3 mLs (2.5 mg total) by nebulization 2 (two) times daily.    No Known Allergies   Current Medications, Allergies, Past Medical History, Past Surgical History, Family History, and Social History were reviewed in [employer] record.    Review of Systems        See HPI - all other systems neg except as noted...  The patient complains of dyspnea on exertion and muscle weakness.  The patient denies anorexia, fever, weight loss, weight gain, vision loss, decreased hearing, hoarseness, chest pain, syncope, peripheral edema, prolonged cough, headaches, hemoptysis, abdominal pain, melena, hematochezia, severe indigestion/heartburn, hematuria, incontinence, suspicious skin lesions, transient blindness, difficulty walking, depression, unusual weight change, abnormal bleeding, enlarged lymph nodes, and angioedema.     Objective:   Physical Exam    WD, Thin, 79 y/o WF in NAD...  she is chr ill appearing... GENERAL:  Alert & oriented; pleasant & cooperative... HEENT:  [place]/AT, EOM-wnl, PERRLA, EACs-clear, TMs-wnl, NOSE-clear, THROAT-clear & wnl. NECK:  Supple w/ fairROM; no JVD; normal carotid impulses w/o bruits; no thyromegaly or nodules palpated; no lymphadenopathy. CHEST:  Decr BS on left; prev left thoracotomy scar; without wheezes/ rales/ or rhonchi heard... HEART:  Regular Rhythm; without murmurs/ rubs/ or gallops detected... ABDOMEN:  Soft & nontender; normal bowel sounds; no organomegaly or masses palpated... EXT: without deformities, mild arthritic changes; no varicose veins/ venous insuffic/ or edema. NEURO:  CN's intact; no focal neuro deficits x mild neuropathy... DERM:  No lesions noted; no rash etc...  RADIOLOGY DATA:  Reviewed in the EPIC EMR & discussed w/ the patient...  LABORATORY DATA:  Reviewed in the EPIC EMR & discussed w/ the patient...   Assessment & Plan:    COPD/ Emphysema/ Hx Pseudomonas pneumonia 10/14>  On NEBS, Advair; just received more Roceph & Vanco IV in NH; I suggest sput C&S, add Mucinex600-2Bid, Fluids, Pred taper...  Hx Lung Cancer>  S/p LLLobectomy 1999 & no known recurrence; CXRs and scans reviewed...  GERD>  On OTC PPI Rx as needed; she denies swallowing difficulty etc, just poor appetite; rec supplements...  IBS>  On Bentyl & Miralax as needed...  Hx Breast Cancer>  Left breast ca w/ surg 2003, then Tamoxifen & released by DrMagrinat in 2008; no known recurrence to date...  DJD>  Fall at home 6/12 w/ Fx rt arm & patella; attended by DrDuda & improved toward baseline; she uses Tylenol/ Tramadol for pain...  Osteoporosis>  Prev on Boniva, but she received her 1st dose of IV Reclast 05/07/12 & her 2nd dose 05/13/13; BMD showed TScores -3.6 in spine & -3.5 in FemNecks...  Neuro> Dementia, Neuropathy>  On Neurontin, we reviewed CTBrain from 6/12 [place]...  Anxiety>  She remains quite anxious & well cared for by her daughter;  continue same meds...  Anemia>  Hg now  down to 9.7 aafter 10/14 hosp...   Patient's Medications  New Prescriptions   No medications on file  Previous Medications   ACETAMINOPHEN (TYLENOL) 325 MG TABLET    Take 325 mg by mouth 4 (four) times daily as needed.   ALBUTEROL (PROVENTIL) (2.5 MG/3ML) 0.083% NEBULIZER SOLUTION    Use 1 vial in nebulizer up to 3 times daily as needed FILE UNDER MCR PART B   CALCIUM CARBONATE (OS-CAL) 600 MG TABS    Take 600 mg by mouth 2 (two) times daily with a meal.     CHLORDIAZEPOXIDE (LIBRIUM) 10 MG CAPSULE    Take 10 mg by mouth 3 (three) times daily.   CHOLECALCIFEROL (VITAMIN D) 1000 UNITS TABLET    Take 1,000 Units by mouth daily.    CYANOCOBALAMIN 2000 MCG TABLET    Take 2,000 mcg by mouth daily.     DICYCLOMINE (BENTYL) 20 MG TABLET    TAKE 1 TABLET BY MOUTH 3 TIMES A DAY AS NEEDED FOR CRAMPING   FERROUS SULFATE 325 (65 FE) MG TABLET    Take 325 mg by mouth daily with breakfast.   FLUTICASONE-SALMETEROL (ADVAIR DISKUS) 250-50 MCG/DOSE AEPB    INHALE 1 PUFF TWICE A DAY   GABAPENTIN (NEURONTIN) 300 MG CAPSULE    TAKE ONE CAPSULE BY MOUTH 3 TIMES A DAY   LANSOPRAZOLE (PREVACID) 15 MG CAPSULE    Take 1 capsule (15 mg total) by mouth 2 (two) times daily.   MECLIZINE (ANTIVERT) 25 MG TABLET    Take 1/2 to 1 tablet by mouth every 4 hours as needed for dizziness   MEGESTROL (MEGACE) 40 MG/ML SUSPENSION    Take 200 mg by mouth 4 (four) times daily.    MIRTAZAPINE (REMERON) 15 MG TABLET    Take 1 tablet (15 mg total) by mouth at bedtime as needed.   MULTIPLE VITAMINS-MINERALS (CENTRUM SILVER PO)    Take 1 tablet by mouth daily.     ONDANSETRON (ZOFRAN) 4 MG TABLET    Take 1 tablet (4 mg total) by mouth every 4 (four) hours as needed for nausea.   PROBIOTIC PRODUCT (ALIGN) 4 MG CAPS    Take 1 capsule by mouth daily.   TRAMADOL (ULTRAM) 50 MG TABLET    Take 50 mg by mouth every 6 (six) hours as needed for pain.   TRIAMCINOLONE CREAM (KENALOG) 0.1 %    Apply 1  application topically 2 (two) times daily. To affected area   VITAMIN E 400 UNIT CAPSULE    Take 400 Units by mouth daily.    Modified Medications   Modified Medication Previous Medication   SERTRALINE (ZOLOFT) 100 MG TABLET sertraline (ZOLOFT) 100 MG tablet      Take 150 mg by mouth daily.    Take 1 tablet (100 mg total) by mouth daily.  Discontinued Medications   ALBUTEROL (PROVENTIL) (2.5 MG/3ML) 0.083% NEBULIZER SOLUTION    Take 3 mLs (2.5 mg total) by nebulization 2 (two) times daily.

## 2013-11-25 NOTE — Patient Instructions (Signed)
Today we updated your med list in our EPIC system...    Continue your current medications the same...  Add MUCINEX 600mg - 2 tabs twice daily w/ lots of fluids by mouth...  Add Prednisone 20mg  tabs- start one tab daily x1wk, then 1/2 tab daily x1wk, then 1/2 tab every other day til return...  Call for any questions...  Let's plan a follow up visit in 38mo, sooner if needed for problems.Marland KitchenMarland Kitchen

## 2013-11-28 ENCOUNTER — Encounter (HOSPITAL_COMMUNITY): Payer: Self-pay | Admitting: Emergency Medicine

## 2013-11-28 ENCOUNTER — Emergency Department (HOSPITAL_COMMUNITY): Payer: Medicare Other

## 2013-11-28 ENCOUNTER — Inpatient Hospital Stay (HOSPITAL_COMMUNITY)
Admission: EM | Admit: 2013-11-28 | Discharge: 2013-12-03 | DRG: 189 | Disposition: A | Discharge status: Skilled Nursing Facility | Payer: Medicare Other | Attending: Internal Medicine | Admitting: Internal Medicine

## 2013-11-28 DIAGNOSIS — F1021 Alcohol dependence, in remission: Secondary | ICD-10-CM | POA: Diagnosis present

## 2013-11-28 DIAGNOSIS — I498 Other specified cardiac arrhythmias: Secondary | ICD-10-CM | POA: Diagnosis present

## 2013-11-28 DIAGNOSIS — R4182 Altered mental status, unspecified: Secondary | ICD-10-CM

## 2013-11-28 DIAGNOSIS — Z8701 Personal history of pneumonia (recurrent): Secondary | ICD-10-CM

## 2013-11-28 DIAGNOSIS — G589 Mononeuropathy, unspecified: Secondary | ICD-10-CM

## 2013-11-28 DIAGNOSIS — F329 Major depressive disorder, single episode, unspecified: Secondary | ICD-10-CM

## 2013-11-28 DIAGNOSIS — C50919 Malignant neoplasm of unspecified site of unspecified female breast: Secondary | ICD-10-CM

## 2013-11-28 DIAGNOSIS — Z85118 Personal history of other malignant neoplasm of bronchus and lung: Secondary | ICD-10-CM

## 2013-11-28 DIAGNOSIS — K8020 Calculus of gallbladder without cholecystitis without obstruction: Secondary | ICD-10-CM

## 2013-11-28 DIAGNOSIS — J309 Allergic rhinitis, unspecified: Secondary | ICD-10-CM | POA: Diagnosis present

## 2013-11-28 DIAGNOSIS — F0390 Unspecified dementia without behavioral disturbance: Secondary | ICD-10-CM | POA: Diagnosis present

## 2013-11-28 DIAGNOSIS — F1020 Alcohol dependence, uncomplicated: Secondary | ICD-10-CM

## 2013-11-28 DIAGNOSIS — J189 Pneumonia, unspecified organism: Secondary | ICD-10-CM

## 2013-11-28 DIAGNOSIS — G8929 Other chronic pain: Secondary | ICD-10-CM | POA: Diagnosis present

## 2013-11-28 DIAGNOSIS — E876 Hypokalemia: Secondary | ICD-10-CM

## 2013-11-28 DIAGNOSIS — J9601 Acute respiratory failure with hypoxia: Secondary | ICD-10-CM | POA: Diagnosis present

## 2013-11-28 DIAGNOSIS — K130 Diseases of lips: Secondary | ICD-10-CM

## 2013-11-28 DIAGNOSIS — K219 Gastro-esophageal reflux disease without esophagitis: Secondary | ICD-10-CM | POA: Diagnosis present

## 2013-11-28 DIAGNOSIS — D509 Iron deficiency anemia, unspecified: Secondary | ICD-10-CM | POA: Diagnosis present

## 2013-11-28 DIAGNOSIS — I10 Essential (primary) hypertension: Secondary | ICD-10-CM | POA: Diagnosis present

## 2013-11-28 DIAGNOSIS — I519 Heart disease, unspecified: Secondary | ICD-10-CM | POA: Diagnosis present

## 2013-11-28 DIAGNOSIS — D649 Anemia, unspecified: Secondary | ICD-10-CM

## 2013-11-28 DIAGNOSIS — R634 Abnormal weight loss: Secondary | ICD-10-CM

## 2013-11-28 DIAGNOSIS — Z87891 Personal history of nicotine dependence: Secondary | ICD-10-CM

## 2013-11-28 DIAGNOSIS — J441 Chronic obstructive pulmonary disease with (acute) exacerbation: Secondary | ICD-10-CM | POA: Diagnosis present

## 2013-11-28 DIAGNOSIS — E43 Unspecified severe protein-calorie malnutrition: Secondary | ICD-10-CM | POA: Diagnosis present

## 2013-11-28 DIAGNOSIS — H9190 Unspecified hearing loss, unspecified ear: Secondary | ICD-10-CM

## 2013-11-28 DIAGNOSIS — M549 Dorsalgia, unspecified: Secondary | ICD-10-CM | POA: Diagnosis present

## 2013-11-28 DIAGNOSIS — C3490 Malignant neoplasm of unspecified part of unspecified bronchus or lung: Secondary | ICD-10-CM

## 2013-11-28 DIAGNOSIS — R413 Other amnesia: Secondary | ICD-10-CM

## 2013-11-28 DIAGNOSIS — G9341 Metabolic encephalopathy: Secondary | ICD-10-CM | POA: Diagnosis present

## 2013-11-28 DIAGNOSIS — R5381 Other malaise: Secondary | ICD-10-CM | POA: Diagnosis present

## 2013-11-28 DIAGNOSIS — F411 Generalized anxiety disorder: Secondary | ICD-10-CM | POA: Diagnosis present

## 2013-11-28 DIAGNOSIS — E440 Moderate protein-calorie malnutrition: Secondary | ICD-10-CM | POA: Insufficient documentation

## 2013-11-28 DIAGNOSIS — M810 Age-related osteoporosis without current pathological fracture: Secondary | ICD-10-CM | POA: Diagnosis present

## 2013-11-28 DIAGNOSIS — Z66 Do not resuscitate: Secondary | ICD-10-CM | POA: Diagnosis present

## 2013-11-28 DIAGNOSIS — R300 Dysuria: Secondary | ICD-10-CM

## 2013-11-28 DIAGNOSIS — K589 Irritable bowel syndrome without diarrhea: Secondary | ICD-10-CM

## 2013-11-28 DIAGNOSIS — G894 Chronic pain syndrome: Secondary | ICD-10-CM

## 2013-11-28 DIAGNOSIS — J9600 Acute respiratory failure, unspecified whether with hypoxia or hypercapnia: Principal | ICD-10-CM | POA: Diagnosis present

## 2013-11-28 DIAGNOSIS — M1990 Unspecified osteoarthritis, unspecified site: Secondary | ICD-10-CM

## 2013-11-28 DIAGNOSIS — L408 Other psoriasis: Secondary | ICD-10-CM

## 2013-11-28 DIAGNOSIS — J449 Chronic obstructive pulmonary disease, unspecified: Secondary | ICD-10-CM

## 2013-11-28 DIAGNOSIS — Z681 Body mass index (BMI) 19 or less, adult: Secondary | ICD-10-CM

## 2013-11-28 DIAGNOSIS — J151 Pneumonia due to Pseudomonas: Secondary | ICD-10-CM

## 2013-11-28 HISTORY — DX: Dorsalgia, unspecified: M54.9

## 2013-11-28 HISTORY — DX: Other chronic pain: G89.29

## 2013-11-28 HISTORY — DX: Unspecified multiple injuries, initial encounter: T07.XXXA

## 2013-11-28 HISTORY — DX: Unspecified severe protein-calorie malnutrition: E43

## 2013-11-28 LAB — LACTIC ACID, PLASMA: Lactic Acid, Venous: 0.9 mmol/L (ref 0.5–2.2)

## 2013-11-28 LAB — BASIC METABOLIC PANEL
CO2: 24 mEq/L (ref 19–32)
Calcium: 9.2 mg/dL (ref 8.4–10.5)
Creatinine, Ser: 0.83 mg/dL (ref 0.50–1.10)
Glucose, Bld: 129 mg/dL — ABNORMAL HIGH (ref 70–99)
Potassium: 4.1 mEq/L (ref 3.5–5.1)
Sodium: 137 mEq/L (ref 135–145)

## 2013-11-28 LAB — URINALYSIS W MICROSCOPIC + REFLEX CULTURE
Hgb urine dipstick: NEGATIVE
Ketones, ur: NEGATIVE mg/dL
Leukocytes, UA: NEGATIVE
Nitrite: NEGATIVE
Protein, ur: NEGATIVE mg/dL
Urobilinogen, UA: 0.2 mg/dL (ref 0.0–1.0)

## 2013-11-28 LAB — CBC WITH DIFFERENTIAL/PLATELET
Eosinophils Relative: 0 % (ref 0–5)
Lymphocytes Relative: 19 % (ref 12–46)
Lymphs Abs: 2.6 10*3/uL (ref 0.7–4.0)
MCH: 29.5 pg (ref 26.0–34.0)
Monocytes Absolute: 1.3 10*3/uL — ABNORMAL HIGH (ref 0.1–1.0)
Monocytes Relative: 9 % (ref 3–12)
Platelets: 223 10*3/uL (ref 150–400)

## 2013-11-28 LAB — BLOOD GAS, ARTERIAL
Bicarbonate: 25.9 mEq/L — ABNORMAL HIGH (ref 20.0–24.0)
Patient temperature: 98.6
pH, Arterial: 7.326 — ABNORMAL LOW (ref 7.350–7.450)
pO2, Arterial: 86.4 mmHg (ref 80.0–100.0)

## 2013-11-28 LAB — PRO B NATRIURETIC PEPTIDE: Pro B Natriuretic peptide (BNP): 856 pg/mL — ABNORMAL HIGH (ref 0–450)

## 2013-11-28 MED ORDER — ALBUTEROL (5 MG/ML) CONTINUOUS INHALATION SOLN
10.0000 mg/h | INHALATION_SOLUTION | RESPIRATORY_TRACT | Status: DC
  Administered 2013-11-28: 10 mg/h via RESPIRATORY_TRACT

## 2013-11-28 MED ORDER — IPRATROPIUM BROMIDE 0.02 % IN SOLN
0.5000 mg | RESPIRATORY_TRACT | Status: DC
  Administered 2013-11-28: 0.5 mg via RESPIRATORY_TRACT
  Filled 2013-11-28: qty 2.5

## 2013-11-28 MED ORDER — SODIUM CHLORIDE 0.9 % IJ SOLN
3.0000 mL | Freq: Two times a day (BID) | INTRAMUSCULAR | Status: DC
  Administered 2013-11-29 – 2013-12-03 (×6): 3 mL via INTRAVENOUS

## 2013-11-28 MED ORDER — PANTOPRAZOLE SODIUM 40 MG PO TBEC
40.0000 mg | DELAYED_RELEASE_TABLET | Freq: Every day | ORAL | Status: DC
  Administered 2013-11-28: 40 mg via ORAL
  Filled 2013-11-28: qty 1

## 2013-11-28 MED ORDER — MIRTAZAPINE 15 MG PO TABS
15.0000 mg | ORAL_TABLET | Freq: Every day | ORAL | Status: DC
  Administered 2013-11-28 – 2013-12-02 (×5): 15 mg via ORAL
  Filled 2013-11-28 (×6): qty 1

## 2013-11-28 MED ORDER — LEVOFLOXACIN IN D5W 750 MG/150ML IV SOLN
750.0000 mg | INTRAVENOUS | Status: DC
  Administered 2013-11-28: 750 mg via INTRAVENOUS
  Filled 2013-11-28: qty 150

## 2013-11-28 MED ORDER — CHLORDIAZEPOXIDE HCL 10 MG PO CAPS
10.0000 mg | ORAL_CAPSULE | Freq: Three times a day (TID) | ORAL | Status: DC
  Administered 2013-11-28 – 2013-12-03 (×14): 10 mg via ORAL
  Filled 2013-11-28 (×14): qty 1

## 2013-11-28 MED ORDER — ONDANSETRON HCL 4 MG/2ML IJ SOLN
4.0000 mg | Freq: Four times a day (QID) | INTRAMUSCULAR | Status: DC | PRN
  Administered 2013-11-29: 4 mg via INTRAVENOUS
  Filled 2013-11-28: qty 2

## 2013-11-28 MED ORDER — LEVALBUTEROL HCL 1.25 MG/0.5ML IN NEBU
1.2500 mg | INHALATION_SOLUTION | RESPIRATORY_TRACT | Status: DC
  Administered 2013-11-28 – 2013-12-02 (×18): 1.25 mg via RESPIRATORY_TRACT
  Filled 2013-11-28 (×29): qty 0.5

## 2013-11-28 MED ORDER — ALIGN 4 MG PO CAPS: 1.0000 | ORAL_CAPSULE | Freq: Every day | ORAL | Status: DC

## 2013-11-28 MED ORDER — ALBUTEROL SULFATE (5 MG/ML) 0.5% IN NEBU: 2.5000 mg | INHALATION_SOLUTION | RESPIRATORY_TRACT | Status: DC

## 2013-11-28 MED ORDER — METHYLPREDNISOLONE SODIUM SUCC 125 MG IJ SOLR
125.0000 mg | Freq: Once | INTRAMUSCULAR | Status: AC
  Administered 2013-11-28: 125 mg via INTRAVENOUS
  Filled 2013-11-28: qty 2

## 2013-11-28 MED ORDER — ALBUTEROL SULFATE (5 MG/ML) 0.5% IN NEBU
2.5000 mg | INHALATION_SOLUTION | Freq: Once | RESPIRATORY_TRACT | Status: AC
  Administered 2013-11-28: 2.5 mg via RESPIRATORY_TRACT
  Filled 2013-11-28: qty 0.5

## 2013-11-28 MED ORDER — SERTRALINE HCL 50 MG PO TABS
150.0000 mg | ORAL_TABLET | Freq: Every day | ORAL | Status: DC
  Administered 2013-11-28 – 2013-11-30 (×3): 150 mg via ORAL
  Filled 2013-11-28 (×3): qty 1

## 2013-11-28 MED ORDER — IPRATROPIUM BROMIDE 0.02 % IN SOLN
0.5000 mg | RESPIRATORY_TRACT | Status: DC
  Administered 2013-11-28 – 2013-12-02 (×18): 0.5 mg via RESPIRATORY_TRACT
  Filled 2013-11-28 (×19): qty 2.5

## 2013-11-28 MED ORDER — ALBUTEROL SULFATE (5 MG/ML) 0.5% IN NEBU
2.5000 mg | INHALATION_SOLUTION | RESPIRATORY_TRACT | Status: DC
  Administered 2013-11-28: 2.5 mg via RESPIRATORY_TRACT
  Filled 2013-11-28: qty 0.5

## 2013-11-28 MED ORDER — ALIGN 4 MG PO CAPS
1.0000 | ORAL_CAPSULE | Freq: Every day | ORAL | Status: DC
  Administered 2013-11-29 – 2013-12-03 (×5): 4 mg via ORAL
  Filled 2013-11-28 (×5): qty 1

## 2013-11-28 MED ORDER — MEGESTROL ACETATE 40 MG/ML PO SUSP
200.0000 mg | Freq: Four times a day (QID) | ORAL | Status: DC
  Administered 2013-11-28 – 2013-12-03 (×17): 200 mg via ORAL
  Filled 2013-11-28 (×23): qty 5

## 2013-11-28 MED ORDER — ALBUTEROL SULFATE (5 MG/ML) 0.5% IN NEBU: 2.5000 mg | INHALATION_SOLUTION | RESPIRATORY_TRACT | Status: DC | PRN

## 2013-11-28 MED ORDER — ONDANSETRON HCL 4 MG PO TABS
4.0000 mg | ORAL_TABLET | Freq: Four times a day (QID) | ORAL | Status: DC | PRN
  Administered 2013-12-03: 4 mg via ORAL
  Filled 2013-11-28: qty 1

## 2013-11-28 MED ORDER — ENOXAPARIN SODIUM 30 MG/0.3ML ~~LOC~~ SOLN
30.0000 mg | SUBCUTANEOUS | Status: DC
  Administered 2013-11-28 – 2013-12-02 (×5): 30 mg via SUBCUTANEOUS
  Filled 2013-11-28 (×6): qty 0.3

## 2013-11-28 MED ORDER — BISACODYL 10 MG RE SUPP: 10.0000 mg | Freq: Every day | RECTAL | Status: DC | PRN

## 2013-11-28 MED ORDER — METHYLPREDNISOLONE SODIUM SUCC 40 MG IJ SOLR
40.0000 mg | Freq: Four times a day (QID) | INTRAMUSCULAR | Status: DC
  Administered 2013-11-28 – 2013-12-01 (×11): 40 mg via INTRAVENOUS
  Filled 2013-11-28 (×15): qty 1

## 2013-11-28 MED ORDER — AMLODIPINE BESYLATE 2.5 MG PO TABS
2.5000 mg | ORAL_TABLET | Freq: Every day | ORAL | Status: DC
  Administered 2013-11-28 – 2013-12-03 (×6): 2.5 mg via ORAL
  Filled 2013-11-28 (×7): qty 1

## 2013-11-28 MED ORDER — PANTOPRAZOLE SODIUM 40 MG PO TBEC
40.0000 mg | DELAYED_RELEASE_TABLET | Freq: Two times a day (BID) | ORAL | Status: DC
  Administered 2013-11-29 – 2013-12-03 (×9): 40 mg via ORAL
  Filled 2013-11-28 (×12): qty 1

## 2013-11-28 MED ORDER — ALBUTEROL (5 MG/ML) CONTINUOUS INHALATION SOLN
INHALATION_SOLUTION | RESPIRATORY_TRACT | Status: AC
  Filled 2013-11-28: qty 20

## 2013-11-28 MED ORDER — SODIUM CHLORIDE 0.9 % IV SOLN
INTRAVENOUS | Status: DC
  Administered 2013-11-28: 11:00:00 via INTRAVENOUS

## 2013-11-28 MED ORDER — TRAMADOL HCL 50 MG PO TABS
50.0000 mg | ORAL_TABLET | Freq: Three times a day (TID) | ORAL | Status: DC
  Administered 2013-11-28 – 2013-12-03 (×15): 50 mg via ORAL
  Filled 2013-11-28 (×15): qty 1

## 2013-11-28 MED ORDER — POLYETHYLENE GLYCOL 3350 17 G PO PACK
17.0000 g | PACK | Freq: Every day | ORAL | Status: DC | PRN
  Filled 2013-11-28: qty 1

## 2013-11-28 MED ORDER — GUAIFENESIN ER 600 MG PO TB12
600.0000 mg | ORAL_TABLET | Freq: Two times a day (BID) | ORAL | Status: DC
  Administered 2013-11-28 – 2013-11-30 (×5): 600 mg via ORAL
  Filled 2013-11-28 (×6): qty 1

## 2013-11-28 MED ORDER — ENOXAPARIN SODIUM 40 MG/0.4ML ~~LOC~~ SOLN: 40.0000 mg | SUBCUTANEOUS | Status: DC

## 2013-11-28 MED ORDER — IPRATROPIUM BROMIDE 0.02 % IN SOLN
1.0000 mg | RESPIRATORY_TRACT | Status: DC
  Administered 2013-11-28: 1 mg via RESPIRATORY_TRACT

## 2013-11-28 MED ORDER — TRAMADOL HCL 50 MG PO TABS
50.0000 mg | ORAL_TABLET | Freq: Once | ORAL | Status: AC
  Administered 2013-11-28: 50 mg via ORAL
  Filled 2013-11-28: qty 1

## 2013-11-28 MED ORDER — PIPERACILLIN-TAZOBACTAM 3.375 G IVPB
3.3750 g | Freq: Three times a day (TID) | INTRAVENOUS | Status: DC
  Administered 2013-11-28 – 2013-12-01 (×8): 3.375 g via INTRAVENOUS
  Filled 2013-11-28 (×12): qty 50

## 2013-11-28 MED ORDER — IPRATROPIUM BROMIDE 0.02 % IN SOLN
RESPIRATORY_TRACT | Status: AC
  Filled 2013-11-28: qty 5

## 2013-11-28 MED ORDER — ENSURE COMPLETE PO LIQD
237.0000 mL | Freq: Two times a day (BID) | ORAL | Status: DC
  Administered 2013-12-01 (×2): 237 mL via ORAL

## 2013-11-28 MED ORDER — BENZONATATE 100 MG PO CAPS
100.0000 mg | ORAL_CAPSULE | Freq: Three times a day (TID) | ORAL | Status: DC
  Administered 2013-11-28 – 2013-12-03 (×14): 100 mg via ORAL
  Filled 2013-11-28 (×18): qty 1

## 2013-11-28 MED ORDER — GABAPENTIN 300 MG PO CAPS
300.0000 mg | ORAL_CAPSULE | Freq: Three times a day (TID) | ORAL | Status: DC
  Administered 2013-11-28 – 2013-12-03 (×14): 300 mg via ORAL
  Filled 2013-11-28 (×18): qty 1

## 2013-11-28 MED ORDER — BUDESONIDE 0.5 MG/2ML IN SUSP
0.5000 mg | Freq: Two times a day (BID) | RESPIRATORY_TRACT | Status: DC
  Administered 2013-11-28 – 2013-12-03 (×10): 0.5 mg via RESPIRATORY_TRACT
  Filled 2013-11-28 (×19): qty 2

## 2013-11-28 MED ORDER — FERROUS SULFATE 325 (65 FE) MG PO TABS
325.0000 mg | ORAL_TABLET | Freq: Every day | ORAL | Status: DC
  Administered 2013-11-29 – 2013-12-03 (×5): 325 mg via ORAL
  Filled 2013-11-28 (×6): qty 1

## 2013-11-28 NOTE — ED Notes (Signed)
Per EMS. Pt from [facility] for rehab from broken L arm October 8. Developed pneumonia after breaking arm. Had vancomycin and rocephin earlier in month, on prednisone now. Ems reports pt has expiratory wheezing and a productive cough. Pt on 2L [place] at nursing home, pt DNR.

## 2013-11-28 NOTE — H&P (Addendum)
[facility] History and Physical  Leslie [name]:[id number] DOB: 27-Jun-1929 DOA: 11/28/2013  Referring physician:  Merlene Laughter PCP:  [name], MD   Chief Complaint:  SOB  HPI:  The patient is a 79 y.o. year-old female with history of COPD, lung CA s/p left lower lobectomy in 1999, pseudomonal pneumonia approximately 2 months ago, hearing loss, mild dementia, anxiety, severe protein calorie malnutrition who presents with SOB.  The patient was last at their baseline health prior to her last admission with pneumonia which was from 10/88 through 10/20.  She had progressive pneumonia and underwent bronchoscopy.  Cultures grew pseudomonas and she completed a course of antibiotics.  She was deconditioned and discharged to SNF.  Since at SNF, she has remained confused.  She underwent repeat CXR due to increased confusion and cough about 2 weeks ago and was started on vancomycin and ceftriaxone. She was seen in clinic by Dr. [name] on 12/19 at which time she had persistent SOB and wheezing.  He recommended sputum culture cultures, mucinex, fluids, and prednisone taper.  She was sent back to [facility] but had progressive wheezing and SOB so she was brought to the ER.  She has been afebrile.    In the ER, she had moderate work of breathing, temperature 98.10F, pulse in low 100s, breathing mid-teens.  She required 4L [place] to keep oxygen saturations in the low 90s.  Labs were notable for WBC 14.2, hgb 11.7, mild elevation of BUN to 20 and creatinine 0.83.  ABG 7.32/51/86 on 3L [place]. CXR demonstrates stable chest with left lung volume loss and left basilar opacity without evidence of new airspace disease.  She was given a duoneb then started on continuous albuterol for an hour with mild improvement.  Solumedrol also administered.  She is being admitted for acute hypoxic respiratory failure/COPD exacerbation not improved on antibiotics and steroids.    Review of Systems:  General:  Denies fevers, chills,  weight loss or gain HEENT:  Mild rhinorrhea, sinus congestion, sore throat CV:  Denies chest pain and palpitations, lower extremity edema.  PULM:  Per HPI  GI:  Denies nausea, vomiting, constipation, diarrhea.   GU:  Denies dysuria, frequency, urgency ENDO:  Denies polyuria, polydipsia.   HEME:  Denies hematemesis, blood in stools, melena, abnormal bruising or bleeding.  LYMPH:  Denies lymphadenopathy.   MSK:  Denies arthralgias, myalgias.   DERM:  Denies skin rash or ulcer.   NEURO:  Denies focal numbness, weakness, slurred speech, facial droop.  Mild increased confusion per daughter PSYCH:  Chronic anxiety and depression.    Past Medical History  Diagnosis Date  . Hearing loss     right ear is better  . COPD (chronic obstructive pulmonary disease)   . GERD (gastroesophageal reflux disease)   . IBS (irritable bowel syndrome)   . Gallstones   . Alcoholism   . Adenocarcinoma, breast   . Primary lung squamous cell carcinoma   . DJD (degenerative joint disease)   . Osteoporosis   . Neuropathy     family member states no neuropathy  . Anxiety   . Psoriasis   . Anemia   . Multiple fractures   . Chronic back pain   . Severe protein-calorie malnutrition    Past Surgical History  Procedure Laterality Date  . Lumbar laminectomy    . 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 Left lung surgery with lllobectomy for squamous cell cancer  09/1998  Dr. Edwyna Shell  . Left breast lumpectomy with sentinel lymph node biopsy    . Left femur fracture repaired  04/2004    Dr. Lajoyce Corners  . Video bronchoscopy Bilateral 09/19/2013    Procedure: VIDEO BRONCHOSCOPY WITH FLUORO;  Surgeon: Oretha Milch, MD;  Location: [facility];  Service: Cardiopulmonary;  Laterality: Bilateral;   Social History:  reports that she quit smoking about 15 years ago. Her smoking use included Cigarettes. She has a 160 pack-year smoking history. She has never used smokeless tobacco. She reports  that she does not drink alcohol or use illicit drugs. SNF, uses cane for ambulation  Allergies  Allergen Reactions  . Fentanyl Other (See Comments)    Drops blood pressure, not allergy, just side effect    Family History  Problem Relation Age of Onset  . Cancer Neg Hx   . Bipolar disorder Sister      Prior to Admission medications   Medication Sig Start Date End Date Taking? Authorizing Provider  albuterol (PROVENTIL) (2.5 MG/3ML) 0.083% nebulizer solution Use 1 vial in nebulizer up to 3 times daily as needed FILE UNDER MCR PART B 03/18/11  Yes [name], MD  amLODipine (NORVASC) 2.5 MG tablet Take 2.5 mg by mouth daily.   Yes Historical Provider, MD  calcium carbonate (OS-CAL) 600 MG TABS Take 600 mg by mouth 2 (two) times daily with a meal.     Yes Historical Provider, MD  chlordiazePOXIDE (LIBRIUM) 10 MG capsule Take 10 mg by mouth 3 (three) times daily. 06/02/13 06/02/14 Yes [name], MD  cholecalciferol (VITAMIN D) 1000 UNITS tablet Take 1,000 Units by mouth daily.    Yes Historical Provider, MD  cyanocobalamin 2000 MCG tablet Take 2,000 mcg by mouth daily.     Yes Historical Provider, MD  ferrous sulfate 325 (65 FE) MG tablet Take 325 mg by mouth daily with breakfast.   Yes Historical Provider, MD  Fluticasone-Salmeterol (ADVAIR DISKUS) 250-50 MCG/DOSE AEPB INHALE 1 PUFF TWICE A DAY 04/21/13  Yes [name], MD  gabapentin (NEURONTIN) 300 MG capsule Take 300 mg by mouth 3 (three) times daily.   Yes Historical Provider, MD  guaiFENesin (MUCINEX) 600 MG 12 hr tablet Take 600 mg by mouth 2 (two) times daily.   Yes Historical Provider, MD  lansoprazole (PREVACID) 15 MG capsule Take 1 capsule (15 mg total) by mouth 2 (two) times daily. 10/15/12  Yes [name], MD  loratadine (CLARITIN) 10 MG tablet Take 10 mg by mouth daily.   Yes Historical Provider, MD  megestrol (MEGACE) 40 MG/ML suspension Take 200 mg by mouth 4 (four) times daily.  07/23/12  Yes [name], MD   mirtazapine (REMERON) 15 MG tablet Take 1 tablet (15 mg total) by mouth at bedtime as needed. 07/29/13  Yes [name], MD  Multiple Vitamins-Minerals (CENTRUM SILVER PO) Take 1 tablet by mouth daily.     Yes Historical Provider, MD  predniSONE (DELTASONE) 20 MG tablet Take 20 mg by mouth daily with breakfast.   Yes Historical Provider, MD  Probiotic Product (ALIGN) 4 MG CAPS Take 1 capsule by mouth daily.   Yes Historical Provider, MD  sertraline (ZOLOFT) 100 MG tablet Take 150 mg by mouth daily. 02/16/13  Yes [name], MD  traMADol (ULTRAM) 50 MG tablet Take 50 mg by mouth 3 (three) times daily.  08/19/13  Yes [name], MD  vitamin E 400 UNIT capsule Take 400 Units by mouth daily.  Yes Historical Provider, MD   Physical Exam: Filed Vitals:   11/28/13 1414 11/28/13 1500 11/28/13 1531 11/28/13 1619  BP: 128/75  149/76   Pulse:   99   Temp: 98.8 F (37.1 C)  98.5 F (36.9 C)   TempSrc: Oral  Oral   Resp: 16  17   Height:  4\' 11"  (1.499 m)    Weight:  40.7 kg (89 lb 11.6 oz)    SpO2: 99%  98% 97%     General:  Cachectic CF, moderate respiratory distress with SCM, supraclavicular retractions, forced exhalations.  Asleep but arousable.  HOH  Eyes:  PERRL, anicteric, non-injected.  ENT:  Nares clear.  OP clear, non-erythematous without plaques or exudates.  MMM.  Neck:  Supple without TM or JVD.    Lymph:  No cervical, supraclavicular, or submandibular LAD.  Cardiovascular:  Tachycardic, RR, normal S1, S2, without m/r/g.  2+ pulses, warm extremities  Respiratory:  Rhonchorous BS, diminished with high pitched full exp wheeze, prolonged and forced exhalation, no focal rales.    Abdomen:  NABS.  Soft, ND/NT.    Skin:  No rashes or focal lesions.  Musculoskeletal:  Normal bulk and tone.  No LE edema.  Psychiatric:  A & O to person and hospital, but not day or date.  Appropriate affect.  Neurologic:  CN 3-12 intact.  4/5 strength.  Sensation intact.  Labs on  Admission:  Basic Metabolic Panel:  Recent Labs Lab 11/28/13 1110  NA 137  K 4.1  CL 101  CO2 24  GLUCOSE 129*  BUN 20  CREATININE 0.83  CALCIUM 9.2   Liver Function Tests: No results found for this basename: AST, ALT, ALKPHOS, BILITOT, PROT, ALBUMIN,  in the last 168 hours No results found for this basename: LIPASE, AMYLASE,  in the last 168 hours No results found for this basename: AMMONIA,  in the last 168 hours CBC:  Recent Labs Lab 11/28/13 1110  WBC 14.2*  NEUTROABS 10.3*  HGB 11.7*  HCT 36.7  MCV 92.4  PLT 223   Cardiac Enzymes:  Recent Labs Lab 11/28/13 1110  TROPONINI <0.30    BNP (last 3 results)  Recent Labs  09/16/13 1832 11/28/13 1110  PROBNP 3115.0* 856.0*   CBG: No results found for this basename: GLUCAP,  in the last 168 hours  Radiological Exams on Admission: Dg Chest Port 1 View  11/28/2013   CLINICAL DATA:  Pneumonia and shortness of breath.  EXAM: PORTABLE CHEST - 1 VIEW  COMPARISON:  11/25/13  FINDINGS: There remains volume loss in the left hemithorax related to partial left lung resection with unchanged leftward mediastinal shift. Airspace opacity in the left lower lung does not appear significantly changed from the prior study. The right lung is hyperexpanded without evidence of new airspace consolidation or pleural effusion. No acute osseous abnormality is identified.  IMPRESSION: Stable appearance of chest with left lung volume loss and left basilar opacity. No evidence of new airspace disease.   Electronically Signed   By: Sebastian Ache   On: 11/28/2013 11:22    EKG: Independently reviewed. Sinus tachycardia with Q-waves in V1-V2, poor baseline.    Assessment/Plan Active Problems:   ANEMIA   ANXIETY   GERD   Acute respiratory failure with hypoxia   Protein-calorie malnutrition, severe   COPD with acute exacerbation   Malnutrition of moderate degree  ---  Acute hypoxic respiratory failure with wheezing and rhonchi. Afebrile  and no new infiltrates on CXR.  Perhaps she is aspirating? -  Consider CT chest -  Solumedrol 1mg /kg q6h  -  Atrovent + xopenex (run of SVT) -  Budesonide BID -  Zosyn day 1 (will also cover previous pseudomonas if there is colonization of the LLL) -  Flutter valve -  Maximize PPI -  Continue meds for allergic rhinitis -  daughter thinks patient may tolerate bipap, but otherwise DNR/DNI -  Monitor for signs of hypercapnea -  F/u sputum cultures -  Start tessalon -  Speech therapy consult for swallow eval once improved -  Pro-BNP mildly elevated, however, she clinically appears dry and BUN:Cr elevated -  Consider ECHO to eval for DD, however, will defer lasix at this time  Anxiety with hx of EtOH abuse, stable.  Continue librium TID (chronic long-term medication and at risk of w/d if decreased or stopped) -  Hold only for sedation -  Continue zoloft  Confusion, subacute, perhaps she had a small stroke?  PCO2 also mildly elevated -  Check ammonia level given hx of EtOH abuse -  Frequent reorientation -  Consider haldol prn  Leukocytosis, likely secondary to recently started steroids  Severe protein calorie malnutrition -  Continue megace and mirazapine -  Appreciate nutrition recommendations -  Ensure bid  Chronic pain, stable.  Continue tramadol TID and gabapentin   HTN, stable.  Continue norvasc  Iron deficiency anemia, stable.  Continue ferrous sulfate  Hold other supplements and vitamins for now.  Diet:  Dysphagia 3 with thin Access:  PIV IVF:  yes Proph:  lovenox  Code Status: DNR  Family Communication: patient and her daughter Disposition Plan: Admit to telemetry  Time spent: 60 min Renae Fickle [facility] Pager [phone]  If 7PM-7AM, please contact night-coverage [email or web address] Password [facility] 11/28/2013, 7:25 PM

## 2013-11-28 NOTE — Progress Notes (Signed)
INITIAL NUTRITION ASSESSMENT  Pt meets criteria for moderate MALNUTRITION in the context of chronic illness as evidenced by mild/moderate muscle wasting and subcutaneous fat loss in temporal/orbital, clavicles, upper arms, and hands.  DOCUMENTATION CODES Per approved criteria  -Non-severe (moderate) malnutrition in the context of chronic illness -Underweight   INTERVENTION: - Ensure Complete TID - Encouraged increased meal intake - Will continue to monitor   NUTRITION DIAGNOSIS: Increased nutrient needs related to underweight as evidenced by 18.11 kg/(m^2).   Goal: Pt to consume >90% of meals/supplements  Monitor:  Weights, labs, intake  Reason for Assessment: Consult   79 y.o. female  Admitting Dx: Wheezing and productive cough  ASSESSMENT: Pt with hx of breast CA, lung CA, COPD, GERD, IBS, osteoporosis, and alcoholism. Pt known to RD from previous admission 2 months ago, pt weighed 72 pounds then, now weighs 89 pounds. Pt from [facility] facility.   Met with pt who reports she has been eating well, 3 meals/day. On Megace PTA to help with appetite and on nutritional supplements, pt unsure the name. Noted pt with mild/moderate muscle wasting and subcutaneous fat loss in temporal/orbital, clavicles, upper arms, and hands.   Height: Ht Readings from Last 1 Encounters:  11/28/13 4\' 11"  (1.499 m)    Weight: Wt Readings from Last 1 Encounters:  11/28/13 89 lb 11.6 oz (40.7 kg)    Ideal Body Weight: 98 lb  % Ideal Body Weight: 91%  Wt Readings from Last 10 Encounters:  11/28/13 89 lb 11.6 oz (40.7 kg)  11/25/13 87 lb 3.2 oz (39.554 kg)  10/03/13 79 lb 6.4 oz (36.016 kg)  09/16/13 72 lb 8.5 oz (32.9 kg)  09/16/13 72 lb 8.5 oz (32.9 kg)  08/03/13 72 lb 9.6 oz (32.931 kg)  05/13/13 75 lb 6.4 oz (34.2 kg)  04/15/13 75 lb 9.6 oz (34.292 kg)  10/15/12 81 lb 12.8 oz (37.104 kg)  07/23/12 80 lb 12.8 oz (36.651 kg)    Usual Body Weight: 72 lb 2 months ago  % Usual  Body Weight: 124%  BMI:  Body mass index is 18.11 kg/(m^2). Underweight  Estimated Nutritional Needs: Kcal: 1200-1400 Protein: 50-60g Fluid: 1.2-1.4L/day  Skin: WNL  Diet Order: Dysphagia 3, thin  EDUCATION NEEDS: -No education needs identified at this time  No intake or output data in the 24 hours ending 11/28/13 1629  Last BM: PTA  Labs:   Recent Labs Lab 11/28/13 1110  NA 137  K 4.1  CL 101  CO2 24  BUN 20  CREATININE 0.83  CALCIUM 9.2  GLUCOSE 129*    CBG (last 3)  No results found for this basename: GLUCAP,  in the last 72 hours  Scheduled Meds: . ipratropium  0.5 mg Nebulization Q4H   And  . albuterol  2.5 mg Nebulization Q4H  . [START ON 11/29/2013] ALIGN  1 capsule Oral Daily  . amLODipine  2.5 mg Oral Daily  . chlordiazePOXIDE  10 mg Oral TID  . enoxaparin (LOVENOX) injection  30 mg Subcutaneous Q24H  . gabapentin  300 mg Oral TID  . guaiFENesin  600 mg Oral BID  . ipratropium      . levofloxacin (LEVAQUIN) IV  750 mg Intravenous Q48H  . megestrol  200 mg Oral QID  . methylPREDNISolone (SOLU-MEDROL) injection  40 mg Intravenous Q6H  . mirtazapine  15 mg Oral QHS  . pantoprazole  40 mg Oral Daily  . sertraline  150 mg Oral Daily  . sodium chloride  3  mL Intravenous Q12H  . traMADol  50 mg Oral TID    Continuous Infusions: . sodium chloride 75 mL/hr at 11/28/13 1126    Past Medical History  Diagnosis Date  . Hearing loss     right ear is better  . COPD (chronic obstructive pulmonary disease)   . GERD (gastroesophageal reflux disease)   . IBS (irritable bowel syndrome)   . Gallstones   . Alcoholism   . Adenocarcinoma, breast   . Primary lung squamous cell carcinoma   . DJD (degenerative joint disease)   . Osteoporosis   . Neuropathy     family member states no neuropathy  . Anxiety   . Psoriasis   . Anemia   . Multiple fractures   . Chronic back pain   . Severe protein-calorie malnutrition     Past Surgical History   Procedure Laterality Date  . Lumbar laminectomy    . 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 Left lung surgery with lllobectomy for squamous cell cancer  09/1998    Dr. Edwyna Shell  . Left breast lumpectomy with sentinel lymph node biopsy    . Left femur fracture repaired  04/2004    Dr. Lajoyce Corners  . Video bronchoscopy Bilateral 09/19/2013    Procedure: VIDEO BRONCHOSCOPY WITH FLUORO;  Surgeon: Oretha Milch, MD;  Location: [facility];  Service: Cardiopulmonary;  Laterality: Bilateral;    Levon Hedger MS, RD, LDN [phone] Pager [phone] After Hours Pager

## 2013-11-28 NOTE — ED Provider Notes (Signed)
CSN: [id number]     Arrival date & time 11/28/13  2952 History   First MD Initiated Contact with Patient 11/28/13 1011     Chief Complaint  Patient presents with  . Pneumonia  . Shortness of Breath    HPI Pt was seen at 1015.  Per EMS, NH report, pt and family, c/o gradual onset and worsening of persistent cough, wheezing and SOB for the past 3 days. Family is concerned that "her pneumonia might be back." Pt was admitted 2 months ago for pneumonia and "hasn't really gotten better." Family states pt recently finished another course of abx, and was re-evaluated by her Pulmonary/PMD for same 3 days ago, rx prednisone. Pt has been using her usual nebs without relief.  Denies CP/palpitations, no back pain, no abd pain, no N/V/D, no fevers, no rash.     Past Medical History  Diagnosis Date  . Hearing loss   . COPD (chronic obstructive pulmonary disease)   . GERD (gastroesophageal reflux disease)   . IBS (irritable bowel syndrome)   . Gallstones   . Alcoholism   . Adenocarcinoma, breast   . Primary lung squamous cell carcinoma   . DJD (degenerative joint disease)   . Osteoporosis   . Neuropathy   . Anxiety   . Psoriasis   . Anemia    Past Surgical History  Procedure Laterality Date  . Lumbar laminectomy    . 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 Left lung surgery with lllobectomy for squamous cell cancer  09/1998    Dr. Edwyna Shell  . Left breast lumpectomy with sentinel lymph node biopsy    . Left femur fracture repaired  04/2004    Dr. Lajoyce Corners  . Video bronchoscopy Bilateral 09/19/2013    Procedure: VIDEO BRONCHOSCOPY WITH FLUORO;  Surgeon: Oretha Milch, MD;  Location: [facility];  Service: Cardiopulmonary;  Laterality: Bilateral;    History  Substance Use Topics  . Smoking status: Former Smoker -- 40 years    Quit date: 12/08/1997  . Smokeless tobacco: Not on file  . Alcohol Use: No     Comment: quit in 1987    Review of Systems ROS:  Statement: All systems negative except as marked or noted in the HPI; Constitutional: Negative for fever and chills. ; ; Eyes: Negative for eye pain, redness and discharge. ; ; ENMT: Negative for ear pain, hoarseness, nasal congestion, sinus pressure and sore throat. ; ; Cardiovascular: Negative for chest pain, palpitations, diaphoresis, and peripheral edema. ; ; Respiratory: +cough, wheezing, SOB. Negative for stridor. ; ; Gastrointestinal: Negative for nausea, vomiting, diarrhea, abdominal pain, blood in stool, hematemesis, jaundice and rectal bleeding. . ; ; Genitourinary: Negative for dysuria, flank pain and hematuria. ; ; Musculoskeletal: Negative for back pain and neck pain. Negative for swelling and trauma.; ; Skin: Negative for pruritus, rash, abrasions, blisters, bruising and skin lesion.; ; Neuro: Negative for headache, lightheadedness and neck stiffness. Negative for weakness, altered level of consciousness , altered mental status, extremity weakness, paresthesias, involuntary movement, seizure and syncope.       Allergies  Review of patient's allergies indicates no known allergies.  Home Medications   Current Outpatient Rx  Name  Route  Sig  Dispense  Refill  . acetaminophen (TYLENOL) 325 MG tablet   Oral   Take 325 mg by mouth 4 (four) times daily as needed.         Marland Kitchen albuterol (PROVENTIL) (2.5 MG/3ML) 0.083% nebulizer solution  Use 1 vial in nebulizer up to 3 times daily as needed FILE UNDER MCR PART B   90 vial   3   . amLODipine (NORVASC) 2.5 MG tablet   Oral   Take 2.5 mg by mouth daily.         . calcium carbonate (OS-CAL) 600 MG TABS   Oral   Take 600 mg by mouth 2 (two) times daily with a meal.           . chlordiazePOXIDE (LIBRIUM) 10 MG capsule   Oral   Take 10 mg by mouth 3 (three) times daily.         . cholecalciferol (VITAMIN D) 1000 UNITS tablet   Oral   Take 1,000 Units by mouth daily.          . cyanocobalamin 2000 MCG tablet   Oral    Take 2,000 mcg by mouth daily.           . ferrous sulfate 325 (65 FE) MG tablet   Oral   Take 325 mg by mouth daily with breakfast.         . Fluticasone-Salmeterol (ADVAIR DISKUS) 250-50 MCG/DOSE AEPB      INHALE 1 PUFF TWICE A DAY   60 each   11   . gabapentin (NEURONTIN) 300 MG capsule   Oral   Take 300 mg by mouth 3 (three) times daily.         Marland Kitchen guaiFENesin (MUCINEX) 600 MG 12 hr tablet   Oral   Take 600 mg by mouth 2 (two) times daily.         . lansoprazole (PREVACID) 15 MG capsule   Oral   Take 1 capsule (15 mg total) by mouth 2 (two) times daily.   60 capsule   11   . loratadine (CLARITIN) 10 MG tablet   Oral   Take 10 mg by mouth daily.         . megestrol (MEGACE) 40 MG/ML suspension   Oral   Take 200 mg by mouth 4 (four) times daily.          . mirtazapine (REMERON) 15 MG tablet   Oral   Take 1 tablet (15 mg total) by mouth at bedtime as needed.   30 tablet   5   . Multiple Vitamins-Minerals (CENTRUM SILVER PO)   Oral   Take 1 tablet by mouth daily.           . predniSONE (DELTASONE) 20 MG tablet   Oral   Take 20 mg by mouth daily with breakfast.         . Probiotic Product (ALIGN) 4 MG CAPS   Oral   Take 1 capsule by mouth daily.         . sertraline (ZOLOFT) 100 MG tablet   Oral   Take 150 mg by mouth daily.         . traMADol (ULTRAM) 50 MG tablet   Oral   Take 50 mg by mouth every 6 (six) hours as needed for pain.         . vitamin E 400 UNIT capsule   Oral   Take 400 Units by mouth daily.            BP 157/86  Pulse 111  Temp(Src) 98.1 F (36.7 C) (Oral)  Resp 18  SpO2 95% Physical Exam 1020: Physical examination:  Nursing notes reviewed; Vital signs and O2 SAT reviewed;  Constitutional: Thin,  frail, uncomfortable appearing.; Head:  Normocephalic, atraumatic; Eyes: EOMI, PERRL, No scleral icterus; ENMT: Mouth and pharynx normal, Mucous membranes dry;; Neck: Supple, Full range of motion, No  lymphadenopathy; Cardiovascular: Tachycardic rate and rhythm, No gallop; Respiratory: Breath sounds diminished & equal bilaterally, bilat insp/exp wheezes with faint audible wheezing.  Speaking in few words. No retrax. Tachypneic, sitting upright.; Chest: Nontender, Movement normal; Abdomen: Soft, Nontender, Nondistended, Normal bowel sounds; Genitourinary: No CVA tenderness; Extremities: Pulses normal, No tenderness, No edema, No calf edema or asymmetry.; Neuro: AA&Ox3, +very HOH, otherwise major CN grossly intact.  Speech clear. No gross focal motor or sensory deficits in extremities.; Skin: Color normal, Warm, Dry.   ED Course  Procedures  1020:  SOB on arrival, sitting upright with audible wheezing. Will start hour long neb and dose IV steroids.   1245:  Hour long neb completed. Appears more comfortable, laying back on stretcher, less tachypneic, but continues tachycardic, Sats 97% on O2 3L N/C, lungs coarse with insp/exp wheezes bilat. No further audible wheezing. Dx and testing d/w pt and family.  Questions answered.  Verb understanding, agreeable to admit.  1300:  T/C to Dr Eloise Harman, case discussed, including:  HPI, pertinent PM/SHx, VS/PE, dx testing, ED course and treatment:  Agreeable pt needs admit, states he is not pt's PMD, please call Dr. Jodelle Green service to admit (he is her PMD).  T/C to [facility] Dr. Malachi Bonds, case discussed, including:  HPI, pertinent PM/SHx, VS/PE, dx testing, ED course and treatment:  Agreeable to admit, requests to write temporary orders, obtain tele bed to team 8.   EKG Interpretation    Date/Time:  Monday November 28 2013 10:19:27 EST Ventricular Rate:  104 PR Interval:  134 QRS Duration: 69 QT Interval:  313 QTC Calculation: 412 R Axis:   109 Text Interpretation:  Poor data quality Artifact Sinus tachycardia Confirmed by [facility]  MD, Nicholos Johns ([id number]) on 11/28/2013 10:40:32 AM            MDM  MDM Reviewed: previous chart, vitals and nursing  note Reviewed previous: labs and ECG Interpretation: labs, ECG and x-ray Total time providing critical care: 30-74 minutes. This excludes time spent performing separately reportable procedures and services. Consults: admitting MD   CRITICAL CARE Performed by: Laray Anger Total critical care time: 35 Critical care time was exclusive of separately billable procedures and treating other patients. Critical care was necessary to treat or prevent imminent or life-threatening deterioration. Critical care was time spent personally by me on the following activities: development of treatment plan with patient and/or surrogate as well as nursing, discussions with consultants, evaluation of patient's response to treatment, examination of patient, obtaining history from patient or surrogate, ordering and performing treatments and interventions, ordering and review of laboratory studies, ordering and review of radiographic studies, pulse oximetry and re-evaluation of patient's condition.    Results for orders placed during the hospital encounter of 11/28/13  URINALYSIS W MICROSCOPIC + REFLEX CULTURE      Result Value Range   Color, Urine YELLOW  YELLOW   APPearance CLEAR  CLEAR   Specific Gravity, Urine 1.014  1.005 - 1.030   pH 6.0  5.0 - 8.0   Glucose, UA NEGATIVE  NEGATIVE mg/dL   Hgb urine dipstick NEGATIVE  NEGATIVE   Bilirubin Urine NEGATIVE  NEGATIVE   Ketones, ur NEGATIVE  NEGATIVE mg/dL   Protein, ur NEGATIVE  NEGATIVE mg/dL   Urobilinogen, UA 0.2  0.0 - 1.0 mg/dL   Nitrite NEGATIVE  NEGATIVE  Leukocytes, UA NEGATIVE  NEGATIVE   RBC / HPF 0-2  <3 RBC/hpf  CBC WITH DIFFERENTIAL      Result Value Range   WBC 14.2 (*) 4.0 - 10.5 K/uL   RBC 3.97  3.87 - 5.11 MIL/uL   Hemoglobin 11.7 (*) 12.0 - 15.0 g/dL   HCT 16.1  09.6 - 04.5 %   MCV 92.4  78.0 - 100.0 fL   MCH 29.5  26.0 - 34.0 pg   MCHC 31.9  30.0 - 36.0 g/dL   RDW 40.9 (*) 81.1 - 91.4 %   Platelets 223  150 - 400 K/uL    Neutrophils Relative % 72  43 - 77 %   Neutro Abs 10.3 (*) 1.7 - 7.7 K/uL   Lymphocytes Relative 19  12 - 46 %   Lymphs Abs 2.6  0.7 - 4.0 K/uL   Monocytes Relative 9  3 - 12 %   Monocytes Absolute 1.3 (*) 0.1 - 1.0 K/uL   Eosinophils Relative 0  0 - 5 %   Eosinophils Absolute 0.0  0.0 - 0.7 K/uL   Basophils Relative 0  0 - 1 %   Basophils Absolute 0.0  0.0 - 0.1 K/uL  BASIC METABOLIC PANEL      Result Value Range   Sodium 137  135 - 145 mEq/L   Potassium 4.1  3.5 - 5.1 mEq/L   Chloride 101  96 - 112 mEq/L   CO2 24  19 - 32 mEq/L   Glucose, Bld 129 (*) 70 - 99 mg/dL   BUN 20  6 - 23 mg/dL   Creatinine, Ser 7.82  0.50 - 1.10 mg/dL   Calcium 9.2  8.4 - 95.6 mg/dL   GFR calc non Af Amer 64 (*) >90 mL/min   GFR calc Af Amer 75 (*) >90 mL/min  TROPONIN I      Result Value Range   Troponin I <0.30  <0.30 ng/mL  LACTIC ACID, PLASMA      Result Value Range   Lactic Acid, Venous 0.9  0.5 - 2.2 mmol/L   Dg Chest 2 View 11/25/2013   CLINICAL DATA:  Cough, congestion  EXAM: CHEST  2 VIEW  COMPARISON:  10/19/2013, 09/19/2013, CT chest 09/15/2013  FINDINGS: There is evidence of left partial lobectomy with left lung volume loss. There are left lower lung airspace opacities likely reflecting posttreatment changes. The right lung is clear. There is compensatory hyperexpansion of the right lung. There is leftward mediastinal shift which is chronic. Stable cardiomediastinal silhouette. Unremarkable osseous structures.  IMPRESSION: Left partial lobectomy with post treatment changes at the left lung base unchanged compared with the prior examination. Superimposed infection at the left lung base cannot be entirely excluded. No significant interval change compared with 10/19/2013.   Electronically Signed   By: Elige Ko   On: 11/25/2013 10:57   Dg Chest Port 1 View 11/28/2013   CLINICAL DATA:  Pneumonia and shortness of breath.  EXAM: PORTABLE CHEST - 1 VIEW  COMPARISON:  11/25/13  FINDINGS: There  remains volume loss in the left hemithorax related to partial left lung resection with unchanged leftward mediastinal shift. Airspace opacity in the left lower lung does not appear significantly changed from the prior study. The right lung is hyperexpanded without evidence of new airspace consolidation or pleural effusion. No acute osseous abnormality is identified.  IMPRESSION: Stable appearance of chest with left lung volume loss and left basilar opacity. No evidence of new airspace disease.  Electronically Signed   By: Sebastian Ache   On: 11/28/2013 11:22      Laray Anger, DO 11/29/13 1108

## 2013-11-28 NOTE — Progress Notes (Signed)
ANTIBIOTIC CONSULT NOTE - INITIAL  Pharmacy Consult for Zosyn  Indication: pneumonia  Allergies  Allergen Reactions  . Fentanyl Other (See Comments)    Drops blood pressure, not allergy, just side effect    Patient Measurements: Height: 4\' 11"  (149.9 cm) Weight: 89 lb 11.6 oz (40.7 kg) IBW/kg (Calculated) : 43.2   Vital Signs: Temp: 98.5 F (36.9 C) (12/22 1531) Temp src: Oral (12/22 1531) BP: 149/76 mmHg (12/22 1531) Pulse Rate: 99 (12/22 1531) Intake/Output from previous day:   Intake/Output from this shift:    Labs:  Recent Labs  11/28/13 1110  WBC 14.2*  HGB 11.7*  PLT 223  CREATININE 0.83   Estimated Creatinine Clearance: 34.2 ml/min (by C-G formula based on Cr of 0.83). No results found for this basename: VANCOTROUGH, VANCOPEAK, VANCORANDOM, GENTTROUGH, GENTPEAK, GENTRANDOM, TOBRATROUGH, TOBRAPEAK, TOBRARND, AMIKACINPEAK, AMIKACINTROU, AMIKACIN,  in the last 72 hours   Microbiology: No results found for this or any previous visit (from the past 720 hour(s)).  Medical History: Past Medical History  Diagnosis Date  . Hearing loss     right ear is better  . COPD (chronic obstructive pulmonary disease)   . GERD (gastroesophageal reflux disease)   . IBS (irritable bowel syndrome)   . Gallstones   . Alcoholism   . Adenocarcinoma, breast   . Primary lung squamous cell carcinoma   . DJD (degenerative joint disease)   . Osteoporosis   . Neuropathy     family member states no neuropathy  . Anxiety   . Psoriasis   . Anemia   . Multiple fractures   . Chronic back pain   . Severe protein-calorie malnutrition     Medications:  Scheduled:  . [START ON 11/29/2013] ALIGN  1 capsule Oral Daily  . amLODipine  2.5 mg Oral Daily  . benzonatate  100 mg Oral TID  . budesonide (PULMICORT) nebulizer solution  0.5 mg Nebulization BID  . chlordiazePOXIDE  10 mg Oral TID  . enoxaparin (LOVENOX) injection  30 mg Subcutaneous Q24H  . [START ON 11/29/2013] feeding  supplement (ENSURE COMPLETE)  237 mL Oral BID BM  . [START ON 11/29/2013] ferrous sulfate  325 mg Oral Q breakfast  . gabapentin  300 mg Oral TID  . guaiFENesin  600 mg Oral BID  . ipratropium      . ipratropium  0.5 mg Nebulization Q4H  . levalbuterol  1.25 mg Nebulization Q4H  . megestrol  200 mg Oral QID  . methylPREDNISolone (SOLU-MEDROL) injection  40 mg Intravenous Q6H  . mirtazapine  15 mg Oral QHS  . [START ON 11/29/2013] pantoprazole  40 mg Oral BID AC  . piperacillin-tazobactam (ZOSYN)  IV  3.375 g Intravenous Q8H  . sertraline  150 mg Oral Daily  . sodium chloride  3 mL Intravenous Q12H  . traMADol  50 mg Oral TID   Infusions:  . sodium chloride 75 mL/hr at 11/28/13 1126   PRN: albuterol, bisacodyl, ondansetron (ZOFRAN) IV, ondansetron, polyethylene glycol Assessment:  79 y.o. year-old female from SNF with history of COPD, lung CA s/p left lower lobectomy in 1999, pseudomonal pneumonia approximately 2 months ago - Bronch from 09/19/13 grew pseudomonas sensitive to Zosyn.     In ER patient presents with hypoxic respiratory failure with wheezing/ronchi.  No new infiltrates on CXR, but concern for aspiration so starting Zosyn to cover pseudomonas   WBC 14.2, Scr 0.83, estimated CrCl 34 ml/min  AF  Sputum culture ordered   Goal of Therapy:  Zosyn per renal function   Plan:  1.) Zosyn 3.375 gm IV Q8h, infuse over 4 hours. 2.) Monitor renal function  3.) f/u cultures   Angla Delahunt, Loma Messing PharmD Pager #: [phone] 7:40 PM 11/28/2013

## 2013-11-28 NOTE — Progress Notes (Signed)
Patient had a run of SVT.  Dr. Malachi Bonds notified.  Will continue to monitor.  Philomena Doheny RN

## 2013-11-28 NOTE — Progress Notes (Signed)
ANTIBIOTIC CONSULT NOTE - INITIAL  Pharmacy Consult for Levaquin Indication: AECOPD  Allergies  Allergen Reactions  . Fentanyl Other (See Comments)    Drops blood pressure, not allergy, just side effect    Patient Measurements: Height: 4' 11.84" (152 cm) Weight: 87 lb 4.8 oz (39.6 kg) IBW/kg (Calculated) : 45.14  Medical History: Past Medical History  Diagnosis Date  . Hearing loss     right ear is better  . COPD (chronic obstructive pulmonary disease)   . GERD (gastroesophageal reflux disease)   . IBS (irritable bowel syndrome)   . Gallstones   . Alcoholism   . Adenocarcinoma, breast   . Primary lung squamous cell carcinoma   . DJD (degenerative joint disease)   . Osteoporosis   . Neuropathy     family member states no neuropathy  . Anxiety   . Psoriasis   . Anemia   . Multiple fractures   . Chronic back pain   . Severe protein-calorie malnutrition      Assessment: 55 yoF from NH presented 12/22 with c/o SOB, cough, wheezing. Pt was recently diagnosed with PNA and completed a 10d course of Cipro. In October, 2014, pt was admitted for pseudomonas aeruginosa PNA. CXR in the ED with stable L basilar opacity. MD order to start Levaquin for AECOPD (although there may be some component of PNA left).   12/22 >> Levaquin >>   Tmax: afeb  WBCs: 14.2K  Renal: Scr 0.83, CG 33 (low given low weight/ht)  Previous Admission 09/19/13 BAL >> pseudomonas aeruginosa (pansensitive) This admission - no culture ordered     Plan:   Levaquin 750 mg IV q48h for AECOPD and maybe some left over component of PNA.  F/u transition to PO when able  Geoffry Paradise, PharmD, BCPS Pager: [phone] 2:55 PM Pharmacy #: [id number]

## 2013-11-28 NOTE — ED Notes (Signed)
Bed: MV78 Expected date:  Expected time:  Means of arrival:  Comments: EMS- Elderly, PNA

## 2013-11-28 NOTE — Progress Notes (Signed)
UR completed 

## 2013-11-29 DIAGNOSIS — R4182 Altered mental status, unspecified: Secondary | ICD-10-CM

## 2013-11-29 LAB — CBC
MCHC: 31.5 g/dL (ref 30.0–36.0)
Platelets: 191 10*3/uL (ref 150–400)
RBC: 3.59 MIL/uL — ABNORMAL LOW (ref 3.87–5.11)
RDW: 15.9 % — ABNORMAL HIGH (ref 11.5–15.5)
WBC: 8.5 10*3/uL (ref 4.0–10.5)

## 2013-11-29 LAB — INFLUENZA PANEL BY PCR (TYPE A & B)
H1N1 flu by pcr: NOT DETECTED
Influenza A By PCR: NEGATIVE
Influenza B By PCR: NEGATIVE

## 2013-11-29 LAB — BASIC METABOLIC PANEL
BUN: 16 mg/dL (ref 6–23)
Calcium: 8.6 mg/dL (ref 8.4–10.5)
Creatinine, Ser: 0.73 mg/dL (ref 0.50–1.10)
GFR calc Af Amer: >90 mL/min (ref >90)
GFR calc non Af Amer: 78 mL/min — ABNORMAL LOW (ref >90)

## 2013-11-29 MED ORDER — LEVALBUTEROL HCL 0.63 MG/3ML IN NEBU: 0.6300 mg | INHALATION_SOLUTION | RESPIRATORY_TRACT | Status: DC | PRN

## 2013-11-29 NOTE — Progress Notes (Signed)
Patient had a run of SVT, after breathing treatment.  Dr. Jomarie Longs notified, and will adjust breathing treatments.  Philomena Doheny RN

## 2013-11-29 NOTE — Progress Notes (Signed)
Clinical Social Work Department BRIEF PSYCHOSOCIAL ASSESSMENT 11/29/2013  Patient:  Leslie [name],Leslie [name]     Account Number:  [id number]     Admit date:  11/28/2013  Clinical Social Worker:  Dennison Bulla  Date/Time:  11/29/2013 11:45 AM  Referred by:  Physician  Date Referred:  11/29/2013 Referred for  SNF Placement   Other Referral:   Interview type:  Family Other interview type:   Dtr    PSYCHOSOCIAL DATA Living Status:  FACILITY Admitted from facility:  [facility] [place] Level of care:  Skilled Nursing Facility Primary support name:  [name] Primary support relationship to patient:  CHILD, ADULT Degree of support available:   Strong    CURRENT CONCERNS Current Concerns  Post-Acute Placement   Other Concerns:    SOCIAL WORK ASSESSMENT / PLAN CSW received referral due to patient being admitted from [name] facility. CSW reviewed chart and met with patient and dtr at bedside. CSW introduced myself and explained role. Patient HOH and drowsy so dtr provided most information for assessment.    Patient had been living at home but was admitted to [facility] for rehab. Dtr reports that patient is not doing better and feels that patient needs to return to [facility] for rehab again. Dtr reports she has been speaking with facility but is concerned that patient cannot be accepted back. CSW explained role and spoke with Revonda Standard at [facility] who is agreeable for patient to return at [facility].    CSW completed FL2 and placed in chart. CSW made dtr aware that [facility] was agreeable to accept patient back. CSW and dtr discussed if LT placement is needed. Dtr has already applied for Medicaid and reports she will talk with SW to determine if patient has been approved.   Assessment/plan status:  Psychosocial Support/Ongoing Assessment of Needs Other assessment/ plan:   Information/referral to community resources:   Return to SNF    PATIENT'S/FAMILY'S RESPONSE TO PLAN OF CARE: Patient drowsy  and did not participate in assessment. Patient's dtr very engaged in assessment and appreciative of CSW information. Dtr agreeable to follow up with DSS and is happy that patient can return to [facility].       [place], [place] [phone]

## 2013-11-29 NOTE — Progress Notes (Signed)
I appreciate the care rendered to my [facility] resident. Just forwarding to the treating team that her 11/26/13 sputum culture from [facility] grew out Pseudomonas aeruginosa that was sensitive to zosyn, imipenum, gentamicin, and ciprofloxacin with intermediate sensitivity to ceftazidime, defepime, and levaquin.

## 2013-11-29 NOTE — Progress Notes (Addendum)
[facility] PROGRESS NOTE  Leslie [name] ZOX:[id number] DOB: 1929-08-22 DOA: 11/28/2013 PCP: [name], MD  Brief narrative: The patient is a 83/F with history of COPD, lung CA s/p left lower lobectomy, pseudomonas pneumonia  2 months ago, hearing loss, mild dementia, anxiety, severe protein calorie malnutrition who presented with SOB. Treated for pseudomonas Pneumonia from 10/8-20 and sent to SNF. Since at SNF, she has remained confused. She underwent repeat CXR due to increased confusion and cough about 2 weeks ago and was started on vancomycin and ceftriaxone. She was seen in clinic by Dr. [name] on 12/19 at which time she had persistent SOB and wheezing. He recommended sputum culture cultures, mucinex, fluids, and prednisone taper. At [facility] SNF had progressive wheezing and SOB so she was brought to the ER.  admitted for acute hypoxic respiratory failure/COPD exacerbation not improved on antibiotics and steroids.   Assessment/Plan: 1. Acute hypoxic respiratory failure - COPD exacerbation/silent aspiration could also be contributing  - continue Solumedrol 40mg  q6h  - Atrovent / xopenex/ Budesonide BID  - Zosyn day 2 , PPI - F/u sputum cultures  - Speech therapy consulted  - Pro-BNP mildly elevated, however, she clinically appears dry and BUN:Cr elevated, continue IVF decrease rate, - last ECHO 10/14 with normal EF and grade 1 diastolic dysfunction  2. Anxiety with hx of EtOH abuse, stable.  -Continue librium TID (chronic long-term medication and at risk of w/d if decreased or stopped)  - Hold only for sedation  - Continue zoloft   3. Metabolic encephalopathy -improved  4. Leukocytosis, likely secondary to recently started steroids   5. Severe protein calorie malnutrition  - Continue megace and mirazapine  - Appreciate nutrition recommendations  - Ensure bid   6. Chronic pain, stable. Continue tramadol TID and gabapentin   7. HTN, stable. Continue norvasc    Proph: lovenox   Code Status: DNR  Family Communication: none at bedside Disposition Plan: back to SNF when improved    Antibiotics:  Zosyn  HPI/Subjective: Breathing much better  Objective: Filed Vitals:   11/29/13 1302  BP: 123/55  Pulse: 79  Temp: 98.2 F (36.8 C)  Resp: 18    Intake/Output Summary (Last 24 hours) at 11/29/13 1553 Last data filed at 11/29/13 1443  Gross per 24 hour  Intake  717.5 ml  Output    575 ml  Net  142.5 ml   Filed Weights   11/28/13 1330 11/28/13 1500 11/29/13 0543  Weight: 39.6 kg (87 lb 4.8 oz) 40.7 kg (89 lb 11.6 oz) 40.9 kg (90 lb 2.7 oz)    Exam:   General:  Elderly frail, emaciated female, very hard of hearing  Cardiovascular: S1S2/RRR  Respiratory: poor air movement b/l, scant exp wheezes  Abdomen: soft, Nt, BS present  Musculoskeletal: no edema c/c   Data Reviewed: Basic Metabolic Panel:  Recent Labs Lab 11/28/13 1110 11/29/13 0530  NA 137 140  K 4.1 3.8  CL 101 105  CO2 24 24  GLUCOSE 129* 132*  BUN 20 16  CREATININE 0.83 0.73  CALCIUM 9.2 8.6   Liver Function Tests: No results found for this basename: AST, ALT, ALKPHOS, BILITOT, PROT, ALBUMIN,  in the last 168 hours No results found for this basename: LIPASE, AMYLASE,  in the last 168 hours  Recent Labs Lab 11/29/13 0530  AMMONIA 25   CBC:  Recent Labs Lab 11/28/13 1110 11/29/13 0530  WBC 14.2* 8.5  NEUTROABS 10.3*  --   HGB 11.7* 10.4*  HCT 36.7 33.0*  MCV 92.4 91.9  PLT 223 191   Cardiac Enzymes:  Recent Labs Lab 11/28/13 1110  TROPONINI <0.30   BNP (last 3 results)  Recent Labs  09/16/13 1832 11/28/13 1110  PROBNP 3115.0* 856.0*   CBG: No results found for this basename: GLUCAP,  in the last 168 hours  No results found for this or any previous visit (from the past 240 hour(s)).   Studies: Dg Chest Port 1 View  11/28/2013   CLINICAL DATA:  Pneumonia and shortness of breath.  EXAM: PORTABLE CHEST - 1 VIEW   COMPARISON:  11/25/13  FINDINGS: There remains volume loss in the left hemithorax related to partial left lung resection with unchanged leftward mediastinal shift. Airspace opacity in the left lower lung does not appear significantly changed from the prior study. The right lung is hyperexpanded without evidence of new airspace consolidation or pleural effusion. No acute osseous abnormality is identified.  IMPRESSION: Stable appearance of chest with left lung volume loss and left basilar opacity. No evidence of new airspace disease.   Electronically Signed   By: Sebastian Ache   On: 11/28/2013 11:22    Scheduled Meds: . ALIGN  1 capsule Oral Daily  . amLODipine  2.5 mg Oral Daily  . benzonatate  100 mg Oral TID  . budesonide (PULMICORT) nebulizer solution  0.5 mg Nebulization BID  . chlordiazePOXIDE  10 mg Oral TID  . enoxaparin (LOVENOX) injection  30 mg Subcutaneous Q24H  . feeding supplement (ENSURE COMPLETE)  237 mL Oral BID BM  . ferrous sulfate  325 mg Oral Q breakfast  . gabapentin  300 mg Oral TID  . guaiFENesin  600 mg Oral BID  . ipratropium  0.5 mg Nebulization Q4H  . levalbuterol  1.25 mg Nebulization Q4H  . megestrol  200 mg Oral QID  . methylPREDNISolone (SOLU-MEDROL) injection  40 mg Intravenous Q6H  . mirtazapine  15 mg Oral QHS  . pantoprazole  40 mg Oral BID AC  . piperacillin-tazobactam (ZOSYN)  IV  3.375 g Intravenous Q8H  . sertraline  150 mg Oral Daily  . sodium chloride  3 mL Intravenous Q12H  . traMADol  50 mg Oral TID   Continuous Infusions: . sodium chloride 10 mL/hr at 11/29/13 1026    Active Problems:   ANEMIA   ANXIETY   GERD   Acute respiratory failure with hypoxia   Protein-calorie malnutrition, severe   COPD with acute exacerbation   Malnutrition of moderate degree   COPD exacerbation    Time spent:    [facility]  [facility] Pager [phone]. If 7PM-7AM, please contact night-coverage at [email or web address], password  [facility] 11/29/2013, 3:53 PM  LOS: 1 day

## 2013-11-30 MED ORDER — SERTRALINE HCL 50 MG PO TABS
50.0000 mg | ORAL_TABLET | Freq: Every day | ORAL | Status: DC
  Administered 2013-12-01 – 2013-12-03 (×3): 50 mg via ORAL
  Filled 2013-11-30 (×3): qty 1

## 2013-11-30 NOTE — Evaluation (Addendum)
Clinical/Bedside Swallow Evaluation Patient Details  Name: Leslie [name] MRN: [id number] Date of Birth: [birth year]  Today's Date: 11/30/2013 Time: 1230-1303 SLP Time Calculation (min): 33 min  Past Medical History:  Past Medical History  Diagnosis Date  . Hearing loss     right ear is better  . COPD (chronic obstructive pulmonary disease)   . GERD (gastroesophageal reflux disease)   . IBS (irritable bowel syndrome)   . Gallstones   . Alcoholism   . Adenocarcinoma, breast   . Primary lung squamous cell carcinoma   . DJD (degenerative joint disease)   . Osteoporosis   . Neuropathy     family member states no neuropathy  . Anxiety   . Psoriasis   . Anemia   . Multiple fractures   . Chronic back pain   . Severe protein-calorie malnutrition    Past Surgical History:  Past Surgical History  Procedure Laterality Date  . Lumbar laminectomy    . 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 Left lung surgery with lllobectomy for squamous cell cancer  09/1998    Dr. Edwyna Shell  . Left breast lumpectomy with sentinel lymph node biopsy    . Left femur fracture repaired  04/2004    Dr. Lajoyce Corners  . Video bronchoscopy Bilateral 09/19/2013    Procedure: VIDEO BRONCHOSCOPY WITH FLUORO;  Surgeon: Oretha Milch, MD;  Location: [facility];  Service: Cardiopulmonary;  Laterality: Bilateral;   HPI:  79 yo female adm to [facility] with respiratory difficulties, diagnosed with COPD exacerbation/acute hypoxic respiratory failure.  PMH + for h/o ETOH abuse, anxiety, metabolic encephalopathy, HTN.    Md ordered bedside swallow evaluation to assess for aspiration risk. Pt resides at [facility].    Assessment / Plan / Recommendation Clinical Impression  Pt presents with functional oropharyngeal swallow based on clinical evalaution.   She is edentulous and feeds herself very small bites/sips.  Pt admits to dry mouth, slp provided her with Biotene.  No oral stasis or symptoms of  pharyngeal stasis noted.  Pt with very subtle throat clearing x1 with liquids via straw, not observed via cup.  No increased work of breathing observed with approx 2 ounces juice, 2 ounces applesauce and single bolus of cracker.    Pt did complain of pill sticking in throat earlier and making her "sick" - advised her to alternative ways to take her medications including using pudding/applesauce followed by water.    SLP can not rule out episodic or overt silent aspiration but doubt present.    Note pt has h/o GERD but she adamantly denies symptoms.   Rec continue soft due to dentition issue and general aspiration precautions.  Will sign off as all education completed, but reconsult if desire.  Thanks.  .     Aspiration Risk  Mild    Diet Recommendation Dysphagia 3 (Mechanical Soft);Thin liquid   Liquid Administration via: Straw;Cup Medication Administration:  (as tolerated) Supervision: Patient able to self feed Compensations: Slow rate;Small sips/bites Postural Changes and/or Swallow Maneuvers: Seated upright 90 degrees;Upright 30-60 min after meal    Other  Recommendations Oral Care Recommendations: Oral care BID   Follow Up Recommendations  None    Frequency and Duration        Pertinent Vitals/Pain Afebrile, lungs with crackles per RN      Swallow Study Prior Functional Status   resides at snf, poor intake PTA per pt but pt can not verbalize reason.  General Date of Onset: 11/28/13 HPI: 79 yo female adm to [facility] with respiratory difficulties, diagnosed with COPD exacerbation/acute hypoxic respiratory failure.  PMH + for h/o ETOH abuse, anxiety, metabolic encephalopathy, HTN.    Md ordered bedside swallow evaluation to assess for aspiration risk. Pt resides at [facility].  Type of Study: Bedside swallow evaluation Previous Swallow Assessment: none Diet Prior to this Study: Dysphagia 3 (soft);Thin liquids Temperature Spikes Noted: No Respiratory Status: Nasal  cannula History of Recent Intubation: No Behavior/Cognition: Alert;Cooperative;Hard of hearing (delayed responses) Oral Cavity - Dentition: Missing dentition;Edentulous (pt reports she has no dentures) Self-Feeding Abilities: Able to feed self Patient Positioning: Upright in bed Baseline Vocal Quality: Clear Volitional Cough: Weak Volitional Swallow: Able to elicit    Oral/Motor/Sensory Function Overall Oral Motor/Sensory Function: Appears within functional limits for tasks assessed   Ice Chips Ice chips: Not tested   Thin Liquid Thin Liquid: Within functional limits Presentation: Cup;Straw;Self Fed Other Comments: VERY SUBTLE throat clear x1 noted after swallow via straw, minimal delay in swallow - ? delayed oral transiting or pharyngeal swallow    Nectar Thick Nectar Thick Liquid: Not tested   Honey Thick Honey Thick Liquid: Not tested   Puree Puree: Within functional limits Presentation: Self Fed;Spoon   Solid   GO    Solid: Impaired Presentation: Self Fed Other Comments: pt takes very small bites/suspect to compensate for lack of dentition, able to "gum" up graham cracker no oral stasis       Donavan Burnet, MS [facility] SLP [phone]    SLP provided pt with amplifier to accommodate her hearing loss, please return it to rehab or leave and nurses station and phone [phone] for rehab personnel to retreive.  Thanks.

## 2013-11-30 NOTE — Progress Notes (Signed)
ANTIBIOTIC CONSULT NOTE - Follow up consult  Pharmacy Consult for Zosyn  Indication: AECOPD, recent P.aeruginosa pna  Allergies  Allergen Reactions  . Fentanyl Other (See Comments)    Drops blood pressure, not allergy, just side effect    Patient Measurements: Height: 4\' 11"  (149.9 cm) Weight: 90 lb 2.7 oz (40.9 kg) IBW/kg (Calculated) : 43.2   Vital Signs: Temp: 98.6 F (37 C) (12/23 2142) Temp src: Oral (12/23 2142) BP: 123/74 mmHg (12/23 2142) Pulse Rate: 89 (12/23 2142) Intake/Output from previous day: 12/23 0701 - 12/24 0700 In: 1611.7 [I.V.:1361.7; IV Piggyback:250] Out: 900 [Urine:900] Intake/Output from this shift:    Labs:  Recent Labs  11/28/13 1110 11/29/13 0530  WBC 14.2* 8.5  HGB 11.7* 10.4*  PLT 223 191  CREATININE 0.83 0.73   Estimated Creatinine Clearance: 35.6 ml/min (by C-G formula based on Cr of 0.73).    Microbiology: No results found for this or any previous visit (from the past 720 hour(s)).  See note in chart from Dr. Eloise Harman regarding results from sputum culture taken at [facility] 11/26/13.  Sputum reportedly grew P.aeruginosa,  S to Zosyn, imipenem, gentamicin, and ciprofloxacin;  I to ceftazidime, cefepime, and levofloxacin.  Medical History: Past Medical History  Diagnosis Date  . Hearing loss     right ear is better  . COPD (chronic obstructive pulmonary disease)   . GERD (gastroesophageal reflux disease)   . IBS (irritable bowel syndrome)   . Gallstones   . Alcoholism   . Adenocarcinoma, breast   . Primary lung squamous cell carcinoma   . DJD (degenerative joint disease)   . Osteoporosis   . Neuropathy     family member states no neuropathy  . Anxiety   . Psoriasis   . Anemia   . Multiple fractures   . Chronic back pain   . Severe protein-calorie malnutrition     Medications:  Scheduled:  . ALIGN  1 capsule Oral Daily  . amLODipine  2.5 mg Oral Daily  . benzonatate  100 mg Oral TID  . budesonide  (PULMICORT) nebulizer solution  0.5 mg Nebulization BID  . chlordiazePOXIDE  10 mg Oral TID  . enoxaparin (LOVENOX) injection  30 mg Subcutaneous Q24H  . feeding supplement (ENSURE COMPLETE)  237 mL Oral BID BM  . ferrous sulfate  325 mg Oral Q breakfast  . gabapentin  300 mg Oral TID  . guaiFENesin  600 mg Oral BID  . ipratropium  0.5 mg Nebulization Q4H  . levalbuterol  1.25 mg Nebulization Q4H  . megestrol  200 mg Oral QID  . methylPREDNISolone (SOLU-MEDROL) injection  40 mg Intravenous Q6H  . mirtazapine  15 mg Oral QHS  . pantoprazole  40 mg Oral BID AC  . piperacillin-tazobactam (ZOSYN)  IV  3.375 g Intravenous Q8H  . sertraline  150 mg Oral Daily  . sodium chloride  3 mL Intravenous Q12H  . traMADol  50 mg Oral TID   Infusions:  . sodium chloride 10 mL/hr at 11/29/13 1026   PRN: bisacodyl, levalbuterol, ondansetron (ZOFRAN) IV, ondansetron, polyethylene glycol  Assessment:  79 y.o.female from SNF with history of COPD, lung CA s/p left lower lobectomy in 1999, Pseudomonal pneumonia approximately 2 months ago, presented with AECOPD and acute respiratory failure.  Sputum culture from 09/19/13 grew Pseudomonas sensitive to Zosyn.   Sputum culture from 11/26/13 reportedly growing Pseudomonas, still S to Zosyn.  Now on D#3  Zosyn 3.375 grams IV q8h (extended-infusion).  Afebrile  Leukocytosis resolved as of yesterday  Goal of Therapy:  Zosyn with dosage adjusted for renal function   Plan:  1. Continue Zosyn 3.375 grams IV q8h (extended-infusion) 2. Follow clinical course  Hope Budds PharmD, BCPS Pager #: [phone] 8:56 AM 11/30/2013

## 2013-11-30 NOTE — Progress Notes (Addendum)
Patient ID: Leslie [name], female   DOB: 1929-01-20, 79 y.o.   MRN: [id number] [facility] PROGRESS NOTE  METZTLI [name] WUJ:[id number] DOB: Apr 18, 1929 DOA: 11/28/2013 PCP: [name], MD  Brief narrative: The patient is a 79/F with history of COPD, lung CA s/p left lower lobectomy, pseudomonas pneumonia 2 months ago, hearing loss, mild dementia, anxiety, severe protein calorie malnutrition who presented with SOB. Treated for pseudomonas Pneumonia from 10/8-20 and sent to SNF. Since at SNF, she has remained confused. She underwent repeat CXR due to increased confusion and cough about 2 weeks ago and was started on vancomycin and ceftriaxone. She was seen in clinic by Dr. [name] on 12/19 at which time she had persistent SOB and wheezing. He recommended analyzing sputum cultures, mucinex, fluids, and prednisone taper. At [facility] SNF had progressive wheezing and SOB so she was brought to the ER and admitted for acute hypoxic respiratory failure/COPD exacerbation.   Assessment/Plan:  1. Acute hypoxic respiratory failure  - COPD exacerbation/silent aspiration could also be contributing  - continue Solumedrol 40mg  q6h and plan on tapering down over the next 24 hours  - Atrovent / xopenex/ Budesonide BID  - Zosyn day 3 , PPI  - F/u sputum cultures  - Speech therapy consulted  2. Anxiety with hx of EtOH abuse, stable.  - Continue librium TID (chronic long-term medication and at risk of w/d if decreased or stopped)  - Hold only for sedation  - Continue zoloft  3. Metabolic encephalopathy  - secondary to principal problem - still somnolent this AM - PT evaluation once more medically stable  4. Leukocytosis - likely secondary to recently started steroids  - trending down and WNL this AM  5. Severe protein calorie malnutrition  - Continue megace and mirazapine  - Appreciate nutrition recommendations  - Ensure bid  6. Chronic pain - stable. Continue tramadol TID and gabapentin  7.  HTN - stable. Continue norvasc  8. Lethargy - lower the dose of Neurontin and Zoloft   Consultants:  None  Procedures/Studies:  None  Antibiotics:  Zosyn 12/23 -->   Code Status: DNR Family Communication: No family at bedside  Disposition Plan: SNF 12/26  HPI/Subjective: No events overnight.   Objective: Filed Vitals:   11/29/13 2142 11/30/13 0832 11/30/13 1219 11/30/13 1311  BP: 123/74   135/74  Pulse: 89   87  Temp: 98.6 F (37 C)   98.1 F (36.7 C)  TempSrc: Oral   Oral  Resp: 16   18  Height:      Weight:      SpO2: 97% 100% 98% 94%    Intake/Output Summary (Last 24 hours) at 11/30/13 1445 Last data filed at 11/30/13 1134  Gross per 24 hour  Intake 1611.67 ml  Output    900 ml  Net 711.67 ml    Exam:   General:  Pt is somnolent but easy to arouse  Cardiovascular: Regular rate and rhythm, S1/S2, no murmurs, no rubs, no gallops  Respiratory: Diminished breath sounds bilaterally, expiratory wheezing noted   Abdomen: Soft, non tender, non distended, bowel sounds present, no guarding  Extremities: No edema, pulses DP and PT palpable bilaterally  Data Reviewed: Basic Metabolic Panel:  Recent Labs Lab 11/28/13 1110 11/29/13 0530  NA 137 140  K 4.1 3.8  CL 101 105  CO2 24 24  GLUCOSE 129* 132*  BUN 20 16  CREATININE 0.83 0.73  CALCIUM 9.2 8.6    Recent Labs Lab 11/29/13 0530  AMMONIA 25   CBC:  Recent Labs Lab 11/28/13 1110 11/29/13 0530  WBC 14.2* 8.5  NEUTROABS 10.3*  --   HGB 11.7* 10.4*  HCT 36.7 33.0*  MCV 92.4 91.9  PLT 223 191   Cardiac Enzymes:  Recent Labs Lab 11/28/13 1110  TROPONINI <0.30   Scheduled Meds: . amLODipine  2.5 mg Oral Daily  . benzonatate  100 mg Oral TID  . budesonide nebulizer   0.5 mg Nebulization BID  . enoxaparin  injection  30 mg Subcutaneous Q24H  . ferrous sulfate  325 mg Oral Q breakfast  . gabapentin  300 mg Oral TID  . guaiFENesin  600 mg Oral BID  . ipratropium  0.5 mg  Nebulization Q4H  . levalbuterol  1.25 mg Nebulization Q4H  . megestrol  200 mg Oral QID  . methylPREDNISolone  inj  40 mg Intravenous Q6H  . mirtazapine  15 mg Oral QHS  . pantoprazole  40 mg Oral BID AC  . ZOSYN  IV  3.375 g Intravenous Q8H  . sertraline  150 mg Oral Daily  . traMADol  50 mg Oral TID   Continuous Infusions: . sodium chloride 10 mL/hr at 11/29/13 1026   Debbora Presto, MD  [facility] Pager [phone]  If 7PM-7AM, please contact night-coverage [email or web address] Password [email or web address] 11/30/2013, 2:45 PM   LOS: 2 days

## 2013-11-30 NOTE — Progress Notes (Signed)
Clinical Social Work  Patient was discussed during progression meeting and MD reports patient is not medically ready for DC. [facility] is agreeable to accept patient at DC but are not accepting patients on Christmas Day. CSW will continue to follow.  [place], [place] [phone]

## 2013-12-01 LAB — BASIC METABOLIC PANEL
BUN: 19 mg/dL (ref 6–23)
CO2: 29 mEq/L (ref 19–32)
Calcium: 8.5 mg/dL (ref 8.4–10.5)
Chloride: 105 mEq/L (ref 96–112)
Creatinine, Ser: 0.81 mg/dL (ref 0.50–1.10)
GFR calc Af Amer: 77 mL/min — ABNORMAL LOW (ref >90)
Potassium: 3.7 mEq/L (ref 3.5–5.1)
Sodium: 144 mEq/L (ref 135–145)

## 2013-12-01 LAB — CBC
Hemoglobin: 11.5 g/dL — ABNORMAL LOW (ref 12.0–15.0)
MCV: 93 fL (ref 78.0–100.0)
Platelets: 245 10*3/uL (ref 150–400)
RBC: 4.01 MIL/uL (ref 3.87–5.11)
RDW: 16 % — ABNORMAL HIGH (ref 11.5–15.5)
WBC: 7.4 10*3/uL (ref 4.0–10.5)

## 2013-12-01 MED ORDER — LEVOFLOXACIN 250 MG PO TABS: 250.0000 mg | ORAL_TABLET | Freq: Every day | ORAL | Status: DC

## 2013-12-01 MED ORDER — PREDNISONE 50 MG PO TABS
50.0000 mg | ORAL_TABLET | Freq: Every day | ORAL | Status: DC
  Administered 2013-12-02 – 2013-12-03 (×2): 50 mg via ORAL
  Filled 2013-12-01 (×3): qty 1

## 2013-12-01 MED ORDER — LEVOFLOXACIN 500 MG PO TABS
500.0000 mg | ORAL_TABLET | Freq: Once | ORAL | Status: DC
Start: 2013-12-01 — End: 2013-12-01
  Filled 2013-12-01: qty 1

## 2013-12-01 MED ORDER — AMOXICILLIN-POT CLAVULANATE 500-125 MG PO TABS
1.0000 | ORAL_TABLET | Freq: Two times a day (BID) | ORAL | Status: DC
  Administered 2013-12-01 – 2013-12-03 (×5): 500 mg via ORAL
  Filled 2013-12-01 (×6): qty 1

## 2013-12-01 MED ORDER — CIPROFLOXACIN HCL 500 MG PO TABS
500.0000 mg | ORAL_TABLET | Freq: Two times a day (BID) | ORAL | Status: DC
  Administered 2013-12-01 – 2013-12-03 (×4): 500 mg via ORAL
  Filled 2013-12-01 (×6): qty 1

## 2013-12-01 NOTE — Progress Notes (Signed)
Patient ID: Leslie [name], female   DOB: Sep 29, 1929, 79 y.o.   MRN: [id number] [facility] PROGRESS NOTE  Leslie [name] WUJ:[id number] DOB: 07-31-1929 DOA: 11/28/2013 PCP: [name], MD  Brief narrative:  The patient is a 83/F with history of COPD, lung CA s/p left lower lobectomy, pseudomonas pneumonia 2 months ago, hearing loss, mild dementia, anxiety, severe protein calorie malnutrition who presented with SOB. Treated for pseudomonas Pneumonia from 10/8-20 and sent to SNF. Since at SNF, she has remained confused. She underwent repeat CXR due to increased confusion and cough about 2 weeks ago and was started on vancomycin and ceftriaxone. She was seen in clinic by Dr. [name] on 12/19 at which time she had persistent SOB and wheezing. He recommended analyzing sputum cultures, mucinex, fluids, and prednisone taper. At [facility] SNF had progressive wheezing and SOB so she was brought to the ER and admitted for acute hypoxic respiratory failure/COPD exacerbation.   Assessment/Plan:  1. Acute hypoxic respiratory failure  - COPD exacerbation/silent aspiration could also be contributing  - taper down solumedrol, change to PO prednisone this AM  - Atrovent / xopenex/ Budesonide BID  - Zosyn day 4 , change to oral Levaquin  - F/u sputum cultures which are negative to date - Speech therapy consulted  2. Anxiety with hx of EtOH abuse, stable.  - Continue librium TID (chronic long-term medication and at risk of w/d if decreased or stopped)  - Hold only for sedation  - Continue zoloft  3. Metabolic encephalopathy  - secondary to principal problem  - still somnolent this AM but easy to arouse  - PT evaluation once more medically stable  4. Leukocytosis  - likely secondary to recently started steroids  - trending down and WNL this AM  5. Severe protein calorie malnutrition  - Continue megace and mirazapine  - Appreciate nutrition recommendations  - Ensure bid  6. Chronic pain  - stable.  Continue tramadol TID and gabapentin  7. HTN  - stable. Continue norvasc  8. Lethargy  - lowered the dose of Neurontin and Zoloft  - pt still somnolent but easier to arouse   Consultants:  None Procedures/Studies:  None Antibiotics:  Zosyn 12/23 -->   Code Status: DNR  Family Communication: No family at bedside  Disposition Plan: SNF 12/26   HPI/Subjective: No events overnight.   Objective: Filed Vitals:   11/30/13 1311 11/30/13 1645 11/30/13 2143 12/01/13 0812  BP: 135/74  148/67   Pulse: 87  93   Temp: 98.1 F (36.7 C)  98.9 F (37.2 C)   TempSrc: Oral  Oral   Resp: 18  16   Height:      Weight:      SpO2: 94% 96% 93% 99%    Intake/Output Summary (Last 24 hours) at 12/01/13 1209 Last data filed at 12/01/13 0948  Gross per 24 hour  Intake  801.5 ml  Output    600 ml  Net  201.5 ml    Exam:   General:  Pt is somnolent but easy to arouse, NAD  Cardiovascular: Regular rate and rhythm, S1/S2, no murmurs, no rubs, no gallops  Respiratory: Clear to auscultation bilaterally, mild end expiratory wheezing   Abdomen: Soft, non tender, non distended, bowel sounds present, no guarding  Extremities: No edema, pulses DP and PT palpable bilaterally  Data Reviewed: Basic Metabolic Panel:  Recent Labs Lab 11/28/13 1110 11/29/13 0530 12/01/13 0516  NA 137 140 144  K 4.1 3.8 3.7  CL  101 105 105  CO2 24 24 29   GLUCOSE 129* 132* 153*  BUN 20 16 19   CREATININE 0.83 0.73 0.81  CALCIUM 9.2 8.6 8.5    Recent Labs Lab 11/29/13 0530  AMMONIA 25   CBC:  Recent Labs Lab 11/28/13 1110 11/29/13 0530 12/01/13 0516  WBC 14.2* 8.5 7.4  NEUTROABS 10.3*  --   --   HGB 11.7* 10.4* 11.5*  HCT 36.7 33.0* 37.3  MCV 92.4 91.9 93.0  PLT 223 191 245   Cardiac Enzymes:  Recent Labs Lab 11/28/13 1110  TROPONINI <0.30   Scheduled Meds: . ALIGN  1 capsule Oral Daily  . amLODipine  2.5 mg Oral Daily  . benzonatate  100 mg Oral TID  . budesonide (PULMICORT)  nebulizer solution  0.5 mg Nebulization BID  . chlordiazePOXIDE  10 mg Oral TID  . enoxaparin (LOVENOX) injection  30 mg Subcutaneous Q24H  . feeding supplement (ENSURE COMPLETE)  237 mL Oral BID BM  . ferrous sulfate  325 mg Oral Q breakfast  . gabapentin  300 mg Oral TID  . ipratropium  0.5 mg Nebulization Q4H  . levalbuterol  1.25 mg Nebulization Q4H  . megestrol  200 mg Oral QID  . methylPREDNISolone (SOLU-MEDROL) injection  40 mg Intravenous Q6H  . mirtazapine  15 mg Oral QHS  . pantoprazole  40 mg Oral BID AC  . piperacillin-tazobactam (ZOSYN)  IV  3.375 g Intravenous Q8H  . sertraline  50 mg Oral Daily  . sodium chloride  3 mL Intravenous Q12H  . traMADol  50 mg Oral TID   Continuous Infusions: . sodium chloride 10 mL/hr at 11/29/13 1026   Debbora Presto, MD  [facility] Pager [phone]  If 7PM-7AM, please contact night-coverage [email or web address] Password [email or web address] 12/01/2013, 12:09 PM   LOS: 3 days

## 2013-12-02 DIAGNOSIS — C50919 Malignant neoplasm of unspecified site of unspecified female breast: Secondary | ICD-10-CM

## 2013-12-02 LAB — CBC
HCT: 37.3 % (ref 36.0–46.0)
MCHC: 31.1 g/dL (ref 30.0–36.0)
MCV: 94.2 fL (ref 78.0–100.0)
RBC: 3.96 MIL/uL (ref 3.87–5.11)
RDW: 15.9 % — ABNORMAL HIGH (ref 11.5–15.5)
WBC: 8.8 10*3/uL (ref 4.0–10.5)

## 2013-12-02 LAB — BASIC METABOLIC PANEL
BUN: 19 mg/dL (ref 6–23)
CO2: 35 mEq/L — ABNORMAL HIGH (ref 19–32)
Chloride: 105 mEq/L (ref 96–112)
Creatinine, Ser: 0.83 mg/dL (ref 0.50–1.10)
GFR calc Af Amer: 75 mL/min — ABNORMAL LOW (ref >90)
Potassium: 3.6 mEq/L (ref 3.5–5.1)
Sodium: 144 mEq/L (ref 135–145)

## 2013-12-02 MED ORDER — TRAMADOL HCL 50 MG PO TABS: 50.0000 mg | ORAL_TABLET | Freq: Three times a day (TID) | ORAL | Status: AC

## 2013-12-02 MED ORDER — LEVALBUTEROL HCL 1.25 MG/0.5ML IN NEBU
1.2500 mg | INHALATION_SOLUTION | Freq: Four times a day (QID) | RESPIRATORY_TRACT | Status: DC
  Administered 2013-12-02 – 2013-12-03 (×4): 1.25 mg via RESPIRATORY_TRACT
  Filled 2013-12-02 (×9): qty 0.5

## 2013-12-02 MED ORDER — BENZONATATE 100 MG PO CAPS: 100.0000 mg | ORAL_CAPSULE | Freq: Three times a day (TID) | ORAL | Status: AC

## 2013-12-02 MED ORDER — PREDNISONE 20 MG PO TABS: 20.0000 mg | ORAL_TABLET | Freq: Every day | ORAL | Status: DC

## 2013-12-02 MED ORDER — CYANOCOBALAMIN 2000 MCG PO TABS: 2000.0000 ug | ORAL_TABLET | Freq: Every day | ORAL | Status: AC

## 2013-12-02 MED ORDER — CIPROFLOXACIN HCL 500 MG PO TABS: 500.0000 mg | ORAL_TABLET | Freq: Two times a day (BID) | ORAL | Status: DC

## 2013-12-02 MED ORDER — CHLORDIAZEPOXIDE HCL 10 MG PO CAPS: 10.0000 mg | ORAL_CAPSULE | Freq: Three times a day (TID) | ORAL | Status: AC

## 2013-12-02 MED ORDER — IPRATROPIUM BROMIDE 0.02 % IN SOLN
0.5000 mg | Freq: Four times a day (QID) | RESPIRATORY_TRACT | Status: DC
  Administered 2013-12-02 – 2013-12-03 (×4): 0.5 mg via RESPIRATORY_TRACT
  Filled 2013-12-02 (×4): qty 2.5

## 2013-12-02 MED ORDER — AMOXICILLIN-POT CLAVULANATE 500-125 MG PO TABS: 1.0000 | ORAL_TABLET | Freq: Two times a day (BID) | ORAL | Status: DC

## 2013-12-02 MED ORDER — BISACODYL 10 MG RE SUPP: 10.0000 mg | Freq: Every day | RECTAL | Status: AC | PRN

## 2013-12-02 NOTE — Care Management Note (Signed)
Cm spoke with patient's daughter Cheryll Cockayne at [phone]. Cm informed daughter of pt's recommendation for SNF. Pt's daughter voiced concerns of Skilled Facility having the ability to care for pt over holiday weekend. Cm informed patient's daughter that pt not appropriate for INPT criteria per MD advisor. CM informed daughter upon MD order for dc if family disagrees, family can initiate an appeal process. Pt's daughter voiced understanding. States understands appeal process. CSW and MD notified of Cm's conversation with daughter.    Roxy Manns Diondre Pulis,MSN,RN [phone]

## 2013-12-02 NOTE — Progress Notes (Signed)
Clinical Social Work  After talking with CM and MD, MD plans to DC patient tomorrow morning. CSW informed SNF who is agreeable and CSW sent AVS over TLC. Patient and dtr Leslie [name]) aware and agreeable to plans.  [place], [place] [phone]

## 2013-12-02 NOTE — Discharge Summary (Signed)
Physician Discharge Summary  LATERA MCLIN ZOX:[id number] DOB: 20-Jan-1929 DOA: 11/28/2013  PCP: [name], MD  Admit date: 11/28/2013 Discharge date: 12/02/2013  Recommendations for Outpatient Follow-up:  1. Pt will need to follow up with PCP in 2-3 weeks post discharge 2. Please obtain BMP to evaluate electrolytes and kidney function 3. Please also check CBC to evaluate Hg and Hct levels 4. Pt discharged on Cipro and Augmentin to complete therapy for HCAP  Discharge Diagnoses: Acute hypoxic respiratory failure  Active Problems:   ANEMIA   ANXIETY   GERD   Acute respiratory failure with hypoxia   Protein-calorie malnutrition, severe   COPD with acute exacerbation   Malnutrition of moderate degree   COPD exacerbation  Discharge Condition: Stable  Diet recommendation: Heart healthy diet discussed in details   Brief narrative:  The patient is a 83/F with history of COPD, lung CA s/p left lower lobectomy, pseudomonas pneumonia 2 months ago, hearing loss, mild dementia, anxiety, severe protein calorie malnutrition who presented with SOB. Treated for pseudomonas Pneumonia from 10/8-20 and sent to SNF. Since at SNF, she has remained confused. She underwent repeat CXR due to increased confusion and cough about 2 weeks ago and was started on vancomycin and ceftriaxone. She was seen in clinic by Dr. [name] on 12/19 at which time she had persistent SOB and wheezing. He recommended analyzing sputum cultures, mucinex, fluids, and prednisone taper. At [facility] SNF had progressive wheezing and SOB so she was brought to the ER and admitted for acute hypoxic respiratory failure/COPD exacerbation.   Assessment/Plan:  1. Acute hypoxic respiratory failure  - COPD exacerbation/silent aspiration could also be contributing  - taper down solumedrol, change to PO prednisone this AM  - Atrovent / xopenex/ Budesonide BID  - Zosyn given for 4 days, now on Cipro and Augmentin to complete therapy for HCAP   - F/u sputum cultures which are negative to date  - Speech therapy consulted  2. Anxiety with hx of EtOH abuse, stable.  - Continue librium TID (chronic long-term medication and at risk of w/d if decreased or stopped)  - Hold only for sedation  - Continue zoloft  3. Metabolic encephalopathy  - secondary to principal problem  - more alert this AM, follows commands appropriately  - PT evaluation done, recommends SNF upon discharge  4. Leukocytosis  - likely secondary to recently started steroids  - trending down and WNL this AM  5. Severe protein calorie malnutrition  - Continue megace and mirazapine  - Appreciate nutrition recommendations  - Ensure bid  6. Chronic pain  - stable. Continue tramadol TID and gabapentin  7. HTN  - stable. Continue norvasc  8. Lethargy  - lowered the dose of Neurontin and Zoloft  - pt more alert this AM  Consultants:  None Procedures/Studies:  None Antibiotics:  Zosyn 12/23 --> 12/25 Augmentin 12/25 --> Cipro 12/25 -->  Code Status: DNR  Family Communication: HCPOA at bedside  Disposition Plan: SNF 12/27  Discharge Exam: Filed Vitals:   12/02/13 1332  BP: 159/83  Pulse: 77  Temp: 98.2 F (36.8 C)  Resp: 16   Filed Vitals:   12/02/13 0615 12/02/13 0820 12/02/13 1330 12/02/13 1332  BP: 136/63   159/83  Pulse: 87   77  Temp: 98 F (36.7 C)   98.2 F (36.8 C)  TempSrc: Oral   Oral  Resp: 18   16  Height:      Weight: 41.8 kg (92 lb 2.4 oz)  SpO2: 98% 96% 90% 92%    General: Pt is alert, follows commands appropriately, not in acute distress, very frail and cachectic Cardiovascular: Regular rate and rhythm, S1/S2 +, no murmurs, no rubs, no gallops Respiratory: Clear to auscultation bilaterally, no wheezing, no crackles, no rhonchi Abdominal: Soft, non tender, non distended, bowel sounds +, no guarding Extremities: no edema, no cyanosis, pulses palpable bilaterally DP and PT  Discharge Instructions  Discharge Orders    Future Appointments Provider Department Dept Phone   01/03/2014 2:00 PM [name], MD [facility] [phone]   Future Orders Complete By Expires   Diet - low sodium heart healthy  As directed    Increase activity slowly  As directed        Medication List         albuterol (2.5 MG/3ML) 0.083% nebulizer solution  Commonly known as:  PROVENTIL  - Use 1 vial in nebulizer up to 3 times daily as needed  - FILE UNDER MCR PART B     ALIGN 4 MG Caps  Take 1 capsule by mouth daily.     amLODipine 2.5 MG tablet  Commonly known as:  NORVASC  Take 2.5 mg by mouth daily.     amoxicillin-clavulanate 500-125 MG per tablet  Commonly known as:  AUGMENTIN  Take 1 tablet (500 mg total) by mouth 2 (two) times daily.     benzonatate 100 MG capsule  Commonly known as:  TESSALON  Take 1 capsule (100 mg total) by mouth 3 (three) times daily.     bisacodyl 10 MG suppository  Commonly known as:  DULCOLAX  Place 1 suppository (10 mg total) rectally daily as needed for moderate constipation.     calcium carbonate 600 MG Tabs tablet  Commonly known as:  OS-CAL  Take 600 mg by mouth 2 (two) times daily with a meal.     CENTRUM SILVER PO  Take 1 tablet by mouth daily.     chlordiazePOXIDE 10 MG capsule  Commonly known as:  LIBRIUM  Take 1 capsule (10 mg total) by mouth 3 (three) times daily.     cholecalciferol 1000 UNITS tablet  Commonly known as:  VITAMIN D  Take 1,000 Units by mouth daily.     ciprofloxacin 500 MG tablet  Commonly known as:  CIPRO  Take 1 tablet (500 mg total) by mouth 2 (two) times daily.     cyanocobalamin 2000 MCG tablet  Take 1 tablet (2,000 mcg total) by mouth daily.     ferrous sulfate 325 (65 FE) MG tablet  Take 325 mg by mouth daily with breakfast.     Fluticasone-Salmeterol 250-50 MCG/DOSE Aepb  Commonly known as:  ADVAIR DISKUS  INHALE 1 PUFF TWICE A DAY     gabapentin 300 MG capsule  Commonly known as:  NEURONTIN  Take 300 mg by mouth  3 (three) times daily.     guaiFENesin 600 MG 12 hr tablet  Commonly known as:  MUCINEX  Take 600 mg by mouth 2 (two) times daily.     lansoprazole 15 MG capsule  Commonly known as:  PREVACID  Take 1 capsule (15 mg total) by mouth 2 (two) times daily.     loratadine 10 MG tablet  Commonly known as:  CLARITIN  Take 10 mg by mouth daily.     megestrol 40 MG/ML suspension  Commonly known as:  MEGACE  Take 200 mg by mouth 4 (four) times daily.     mirtazapine  15 MG tablet  Commonly known as:  REMERON  Take 1 tablet (15 mg total) by mouth at bedtime as needed.     predniSONE 20 MG tablet  Commonly known as:  DELTASONE  Take 1 tablet (20 mg total) by mouth daily with breakfast.     sertraline 100 MG tablet  Commonly known as:  ZOLOFT  Take 150 mg by mouth daily.     traMADol 50 MG tablet  Commonly known as:  ULTRAM  Take 1 tablet (50 mg total) by mouth 3 (three) times daily.     vitamin E 400 UNIT capsule  Take 400 Units by mouth daily.           Follow-up Information   Follow up with NADEL,SCOTT M, MD In 2 weeks.   Specialty:  Pulmonary Disease   Contact information:   [address] [place] [address] [phone]        The results of significant diagnostics from this hospitalization (including imaging, microbiology, ancillary and laboratory) are listed below for reference.     Microbiology: No results found for this or any previous visit (from the past 240 hour(s)).   Labs: Basic Metabolic Panel:  Recent Labs Lab 11/28/13 1110 11/29/13 0530 12/01/13 0516 12/02/13 0550  NA 137 140 144 144  K 4.1 3.8 3.7 3.6  CL 101 105 105 105  CO2 24 24 29  35*  GLUCOSE 129* 132* 153* 108*  BUN 20 16 19 19   CREATININE 0.83 0.73 0.81 0.83  CALCIUM 9.2 8.6 8.5 8.0*    Recent Labs Lab 11/29/13 0530  AMMONIA 25   CBC:  Recent Labs Lab 11/28/13 1110 11/29/13 0530 12/01/13 0516 12/02/13 0550  WBC 14.2* 8.5 7.4 8.8  NEUTROABS 10.3*  --   --   --    HGB 11.7* 10.4* 11.5* 11.6*  HCT 36.7 33.0* 37.3 37.3  MCV 92.4 91.9 93.0 94.2  PLT 223 191 245 291   Cardiac Enzymes:  Recent Labs Lab 11/28/13 1110  TROPONINI <0.30   BNP: BNP (last 3 results)  Recent Labs  09/16/13 1832 11/28/13 1110  PROBNP 3115.0* 856.0*   SIGNED: Time coordinating discharge: Over 30 minutes  Debbora Presto, MD  [facility] 12/02/2013, 5:06 PM Pager [phone]  If 7PM-7AM, please contact night-coverage [email or web address] Password [email or web address]

## 2013-12-02 NOTE — Care Management Note (Signed)
Pt from SNF. CSW following. CM to sign off. Please enter Cm consult if disposition changes.    Roxy Manns Loralye Loberg,MSN,RN [phone]

## 2013-12-02 NOTE — Evaluation (Signed)
Physical Therapy Evaluation Patient Details Name: Leslie [name] MRN: [id number] DOB: 1929-01-25 Today's Date: 12/02/2013 Time: 8413-2440 PT Time Calculation (min): 20 min  PT Assessment / Plan / Recommendation History of Present Illness     Clinical Impression  Pt admitted with COPD exacerbation and presenting with functional mobility limitations 2* limited endurance and balance deficits with mobility - partially compensated with use of RW during ambulation.  Pt would benefit from follow up rehab at SNF level to maximize IND and safety.    PT Assessment  Patient needs continued PT services    Follow Up Recommendations  SNF    Does the patient have the potential to tolerate intense rehabilitation      Barriers to Discharge        Equipment Recommendations  None recommended by PT    Recommendations for Other Services     Frequency Min 3X/week    Precautions / Restrictions Precautions Precautions: Fall Restrictions Weight Bearing Restrictions: No   Pertinent Vitals/Pain No specific c/p pain      Mobility  Bed Mobility Bed Mobility: Sit to Supine Sit to Supine: 4: Min assist Details for Bed Mobility Assistance: cues for saftey and min assist for trunk control Transfers Transfers: Sit to Stand;Stand to Sit Sit to Stand: 4: Min assist;With armrests;From chair/3-in-1 Stand to Sit: 4: Min assist;To bed;With armrests;With upper extremity assist;To chair/3-in-1 Details for Transfer Assistance: cues for transition position and use of UEs to self assist Ambulation/Gait Ambulation/Gait Assistance: 4: Min assist Ambulation Distance (Feet): 150 Feet Assistive device: Rolling walker Ambulation/Gait Assistance Details: cues for posture, position from RW, increased BOS Gait Pattern: Step-to pattern;Step-through pattern;Shuffle;Trunk flexed;Narrow base of support;Scissoring Gait velocity: decr General Gait Details: General instability with mild scissoring gait partially  compensated with use of RW    Exercises     PT Diagnosis: Difficulty walking  PT Problem List: Decreased strength;Decreased range of motion;Decreased activity tolerance;Decreased mobility;Decreased knowledge of use of DME;Pain PT Treatment Interventions: DME instruction;Gait training;Stair training;Functional mobility training;Therapeutic activities;Therapeutic exercise;Patient/family education     PT Goals(Current goals can be found in the care plan section) Acute Rehab PT Goals Patient Stated Goal: Back to bed PT Goal Formulation: With patient Time For Goal Achievement: 12/09/13 Potential to Achieve Goals: Fair  Visit Information  Last PT Received On: 12/02/13 Assistance Needed: +1       Prior Functioning  Home Living Family/patient expects to be discharged to:: Skilled nursing facility Prior Function Level of Independence: Needs assistance Gait / Transfers Assistance Needed: pt states she didnt use an assistive device PTA Comments: Pt states she mainly walked "around the house"  Pt has been in SNF x ~ 2 months Communication Communication: HOH (Ltd improvement with amplifier)    Cognition  Cognition Arousal/Alertness: Awake/alert Behavior During Therapy: WFL for tasks assessed/performed Overall Cognitive Status: Within Functional Limits for tasks assessed    Extremity/Trunk Assessment Upper Extremity Assessment Upper Extremity Assessment: Overall WFL for tasks assessed Lower Extremity Assessment Lower Extremity Assessment: Overall WFL for tasks assessed   Balance    End of Session PT - End of Session Equipment Utilized During Treatment: Gait belt Activity Tolerance: Patient tolerated treatment well Patient left: in bed;with call bell/phone within reach Nurse Communication: Mobility status  GP     Leslie [name] 12/02/2013, 2:47 PM

## 2013-12-02 NOTE — Progress Notes (Signed)
PT was unwilling to complete entire neb tx.

## 2013-12-02 NOTE — Progress Notes (Signed)
Clinical Social Work  Per MD, patient is not going to DC today. CSW updated SNF who is agreeable to accept patient over the weekend if stable. Weekend CSW to contact [place] at [phone] if weekend DC. CSW will continue to follow.  [place], [place] [phone]

## 2013-12-03 MED ORDER — BUDESONIDE 0.5 MG/2ML IN SUSP: 0.5000 mg | Freq: Two times a day (BID) | RESPIRATORY_TRACT | Status: DC

## 2013-12-03 NOTE — Progress Notes (Addendum)
Per MD, Pt ready for d/c.  Notified RN.  Pt sleeping soundly.  Notified daughter.  Sent d/c summary.  Confirmed receipt of d/c summary.  Facility ready to receive Pt.  Arranged for transportation.  Providence Crosby, LCSWA Clinical Social Work [phone]

## 2013-12-03 NOTE — Discharge Summary (Signed)
Physician Discharge Summary  Leslie [name]:[id number] DOB: 04-15-1929 DOA: 11/28/2013  PCP: [name], MD  Admit date: 11/28/2013 Discharge date: 12/03/2013 Recommendations for Outpatient Follow-up:  1. Pt will need to follow up with PCP in 2-3 weeks post discharge 2. Please obtain BMP to evaluate electrolytes and kidney function 3. Please also check CBC to evaluate Hg and Hct levels 4. Pt discharged on Cipro and Augmentin to complete therapy for HCAP for 7 more days post discharge  5.  Discharge Diagnoses: Acute hypoxic respiratory failure   Active Problems:  ANEMIA  ANXIETY  GERD  Acute respiratory failure with hypoxia  Protein-calorie malnutrition, severe  COPD with acute exacerbation  Malnutrition of moderate degree  COPD exacerbation   Discharge Condition: Stable   Diet recommendation: Heart healthy diet discussed in details   Brief narrative:  The patient is a 83/F with history of COPD, lung CA s/p left lower lobectomy, pseudomonas pneumonia 2 months ago, hearing loss, mild dementia, anxiety, severe protein calorie malnutrition who presented with SOB. Treated for pseudomonas Pneumonia from 10/8-20 and sent to SNF. Since at SNF, she has remained confused. She underwent repeat CXR due to increased confusion and cough about 2 weeks ago and was started on vancomycin and ceftriaxone. She was seen in clinic by Dr. [name] on 12/19 at which time she had persistent SOB and wheezing. He recommended analyzing sputum cultures, mucinex, fluids, and prednisone taper. At [facility] had progressive wheezing and SOB so she was brought to the ER and admitted for acute hypoxic respiratory failure/COPD exacerbation.   Assessment/Plan:  1. Acute hypoxic respiratory failure  - COPD exacerbation/silent aspiration could also be contributing  - taper down solumedrol, change to PO prednisone this AM  - Atrovent / xopenex/ Budesonide BID  - Zosyn given for 4 days, now on Cipro and  Augmentin to complete therapy for HCAP for 7 more days post discharge  - F/u sputum cultures which are negative to date  - Speech therapy consulted  2. Anxiety with hx of EtOH abuse, stable.  - Continue librium TID (chronic long-term medication and at risk of w/d if decreased or stopped)  - Hold only for sedation  - Continue zoloft  3. Metabolic encephalopathy  - secondary to principal problem  - more alert this AM, follows commands appropriately  - PT evaluation done, recommends SNF upon discharge  4. Leukocytosis  - likely secondary to recently started steroids  - trending down and WNL this AM  5. Severe protein calorie malnutrition  - Continue megace and mirazapine  - Appreciate nutrition recommendations  - Ensure bid  6. Chronic pain  - stable. Continue tramadol TID and gabapentin  7. HTN  - stable. Continue norvasc  8. Lethargy  - lowered the dose of Neurontin and Zoloft  - pt more alert this AM and follows commands appropriately   Consultants:  None Procedures/Studies:  None Antibiotics:  Zosyn 12/23 --> 12/25  Augmentin 12/25 --> 7 more days post discharge  Cipro 12/25 --> 7 more days post discharge   Code Status: DNR  Family Communication: HCPOA at bedside  Disposition Plan: SNF 12/27   Discharge Exam: Filed Vitals:   12/03/13 0621  BP: 124/68  Pulse: 66  Temp: 98.5 F (36.9 C)  Resp: 16   Filed Vitals:   12/02/13 2036 12/02/13 2100 12/03/13 0621 12/03/13 0921  BP:  138/63 124/68   Pulse:  81 66   Temp:  99.3 F (37.4 C) 98.5 F (36.9 C)  TempSrc:  Oral Oral   Resp:  18 16   Height:      Weight:   42.4 kg (93 lb 7.6 oz)   SpO2: 98% 97% 98% 98%    General: Pt is alert, follows commands appropriately, not in acute distress Cardiovascular: Regular rate and rhythm, S1/S2 +, no murmurs, no rubs, no gallops Respiratory: Clear to auscultation, no wheezing, no crackles, no rhonchi Abdominal: Soft, non tender, non distended, bowel sounds +, no  guarding Extremities: no edema, no cyanosis, pulses palpable bilaterally DP and PT  Discharge Instructions  Discharge Orders   Future Appointments Provider Department Dept Phone   01/03/2014 2:00 PM [name], MD [facility] [phone]   Future Orders Complete By Expires   Diet - low sodium heart healthy  As directed    Diet - low sodium heart healthy  As directed    Diet - low sodium heart healthy  As directed    Increase activity slowly  As directed    Increase activity slowly  As directed    Increase activity slowly  As directed        Medication List         albuterol (2.5 MG/3ML) 0.083% nebulizer solution  Commonly known as:  PROVENTIL  - Use 1 vial in nebulizer up to 3 times daily as needed  - FILE UNDER MCR PART B     ALIGN 4 MG Caps  Take 1 capsule by mouth daily.     amLODipine 2.5 MG tablet  Commonly known as:  NORVASC  Take 2.5 mg by mouth daily.     amoxicillin-clavulanate 500-125 MG per tablet  Commonly known as:  AUGMENTIN  Take 1 tablet (500 mg total) by mouth 2 (two) times daily.     benzonatate 100 MG capsule  Commonly known as:  TESSALON  Take 1 capsule (100 mg total) by mouth 3 (three) times daily.     bisacodyl 10 MG suppository  Commonly known as:  DULCOLAX  Place 1 suppository (10 mg total) rectally daily as needed for moderate constipation.     budesonide 0.5 MG/2ML nebulizer solution  Commonly known as:  PULMICORT  Take 2 mLs (0.5 mg total) by nebulization 2 (two) times daily.     calcium carbonate 600 MG Tabs tablet  Commonly known as:  OS-CAL  Take 600 mg by mouth 2 (two) times daily with a meal.     CENTRUM SILVER PO  Take 1 tablet by mouth daily.     chlordiazePOXIDE 10 MG capsule  Commonly known as:  LIBRIUM  Take 1 capsule (10 mg total) by mouth 3 (three) times daily.     cholecalciferol 1000 UNITS tablet  Commonly known as:  VITAMIN D  Take 1,000 Units by mouth daily.     ciprofloxacin 500 MG tablet   Commonly known as:  CIPRO  Take 1 tablet (500 mg total) by mouth 2 (two) times daily.     cyanocobalamin 2000 MCG tablet  Take 1 tablet (2,000 mcg total) by mouth daily.     ferrous sulfate 325 (65 FE) MG tablet  Take 325 mg by mouth daily with breakfast.     Fluticasone-Salmeterol 250-50 MCG/DOSE Aepb  Commonly known as:  ADVAIR DISKUS  INHALE 1 PUFF TWICE A DAY     gabapentin 300 MG capsule  Commonly known as:  NEURONTIN  Take 300 mg by mouth 3 (three) times daily.     guaiFENesin 600 MG 12 hr  tablet  Commonly known as:  MUCINEX  Take 600 mg by mouth 2 (two) times daily.     lansoprazole 15 MG capsule  Commonly known as:  PREVACID  Take 1 capsule (15 mg total) by mouth 2 (two) times daily.     loratadine 10 MG tablet  Commonly known as:  CLARITIN  Take 10 mg by mouth daily.     megestrol 40 MG/ML suspension  Commonly known as:  MEGACE  Take 200 mg by mouth 4 (four) times daily.     mirtazapine 15 MG tablet  Commonly known as:  REMERON  Take 1 tablet (15 mg total) by mouth at bedtime as needed.     predniSONE 20 MG tablet  Commonly known as:  DELTASONE  Take 1 tablet (20 mg total) by mouth daily with breakfast.     sertraline 100 MG tablet  Commonly known as:  ZOLOFT  Take 150 mg by mouth daily.     traMADol 50 MG tablet  Commonly known as:  ULTRAM  Take 1 tablet (50 mg total) by mouth 3 (three) times daily.     vitamin E 400 UNIT capsule  Take 400 Units by mouth daily.           Follow-up Information   Follow up with NADEL,SCOTT M, MD In 2 weeks.   Specialty:  Pulmonary Disease   Contact information:   [address] [place] [address] [phone]       Follow up with Debbora Presto, MD. (As needed if symptoms worsencall my cell phone [phone])    Specialty:  Internal Medicine   Contact information:   [address] [place] [address] [phone]        The results of significant diagnostics from this hospitalization  (including imaging, microbiology, ancillary and laboratory) are listed below for reference.     Microbiology: No results found for this or any previous visit (from the past 240 hour(s)).   Labs: Basic Metabolic Panel:  Recent Labs Lab 11/28/13 1110 11/29/13 0530 12/01/13 0516 12/02/13 0550  NA 137 140 144 144  K 4.1 3.8 3.7 3.6  CL 101 105 105 105  CO2 24 24 29  35*  GLUCOSE 129* 132* 153* 108*  BUN 20 16 19 19   CREATININE 0.83 0.73 0.81 0.83  CALCIUM 9.2 8.6 8.5 8.0*   Recent Labs Lab 11/29/13 0530  AMMONIA 25   CBC:  Recent Labs Lab 11/28/13 1110 11/29/13 0530 12/01/13 0516 12/02/13 0550  WBC 14.2* 8.5 7.4 8.8  NEUTROABS 10.3*  --   --   --   HGB 11.7* 10.4* 11.5* 11.6*  HCT 36.7 33.0* 37.3 37.3  MCV 92.4 91.9 93.0 94.2  PLT 223 191 245 291   Cardiac Enzymes:  Recent Labs Lab 11/28/13 1110  TROPONINI <0.30   BNP: BNP (last 3 results)  Recent Labs  09/16/13 1832 11/28/13 1110  PROBNP 3115.0* 856.0*   SIGNED: Time coordinating discharge: Over 30 minutes  Debbora Presto, MD  [facility] 12/03/2013, 11:31 AM Pager [phone]  If 7PM-7AM, please contact night-coverage [email or web address] Password [email or web address]

## 2013-12-12 ENCOUNTER — Encounter (HOSPITAL_COMMUNITY): Payer: Self-pay | Admitting: Anesthesiology

## 2014-01-03 ENCOUNTER — Encounter: Payer: Self-pay | Admitting: Pulmonary Disease

## 2014-01-03 ENCOUNTER — Ambulatory Visit (INDEPENDENT_AMBULATORY_CARE_PROVIDER_SITE_OTHER): Payer: Medicare Other | Admitting: Pulmonary Disease

## 2014-01-03 VITALS — BP 124/62 | HR 104 | Temp 98.0°F | Wt 87.8 lb

## 2014-01-03 DIAGNOSIS — J449 Chronic obstructive pulmonary disease, unspecified: Secondary | ICD-10-CM

## 2014-01-03 DIAGNOSIS — F411 Generalized anxiety disorder: Secondary | ICD-10-CM

## 2014-01-03 DIAGNOSIS — K589 Irritable bowel syndrome without diarrhea: Secondary | ICD-10-CM

## 2014-01-03 DIAGNOSIS — M810 Age-related osteoporosis without current pathological fracture: Secondary | ICD-10-CM

## 2014-01-03 DIAGNOSIS — F1020 Alcohol dependence, uncomplicated: Secondary | ICD-10-CM

## 2014-01-03 DIAGNOSIS — C50919 Malignant neoplasm of unspecified site of unspecified female breast: Secondary | ICD-10-CM

## 2014-01-03 DIAGNOSIS — J151 Pneumonia due to Pseudomonas: Secondary | ICD-10-CM

## 2014-01-03 DIAGNOSIS — C3490 Malignant neoplasm of unspecified part of unspecified bronchus or lung: Secondary | ICD-10-CM

## 2014-01-03 DIAGNOSIS — R413 Other amnesia: Secondary | ICD-10-CM

## 2014-01-03 DIAGNOSIS — M1990 Unspecified osteoarthritis, unspecified site: Secondary | ICD-10-CM

## 2014-01-03 DIAGNOSIS — J4489 Other specified chronic obstructive pulmonary disease: Secondary | ICD-10-CM

## 2014-01-03 DIAGNOSIS — K219 Gastro-esophageal reflux disease without esophagitis: Secondary | ICD-10-CM

## 2014-01-03 DIAGNOSIS — E440 Moderate protein-calorie malnutrition: Secondary | ICD-10-CM

## 2014-01-03 NOTE — Progress Notes (Signed)
Subjective:    Patient ID: Leslie [name], female    DOB: [birth year], 80 y.o.   MRN: [id number]  HPI 80 y/o WF here for a 6 month follow up visit... she has mult medical problems as listed...   ~  October 15, 2012:  33mo ROV & Leslie [name] has gained 2# to 82# today taking Megace but just 1tsp daily (rec to incr to Qid as prescribed); she also notes that Meclizine has helped dizziness, & Zofran helped the nausea; she wants to incr her Prevacid 15mg  to Bid- ok...    COPD, Hx LungCa, s/p LLLobectomy 1999> on Advair250, NEBS, Mucinex; CXR 5/13 w/ chr post op changes, NAD...    GERD> on Prev15; wants to incr to Bid as above- ok...    IBS w/ Constip> on Bentyl & Miralax but won't take it regularly as advised...    Hx Breast Ca> she has been off Tamoxifen since 2008; she needs to get her f/u mammogram at [facility]...    DJD, Osteoporosis> s/p LLam, left hip fx, right humerus fx, right patella fx, & severe DDD; Ortho per DrDuda; prev on boniva, now Recleast w/ infusion 5/13...    Dementia, Neuropathy> CT w/ atrophy 7 chr sm vessel dis; on Neurontin 300Tid    Anxiety> hx Etoh in past, on Librium, Zoloft, Remeron...    Psoriasis> followed by DrFHouston... We reviewed prob list, meds, xrays and labs> see below for updates >>   ~  Apr 15, 2013:  1mo ROV & Leslie [name] says she feels OK, appetite is fair & helped by Megace but her weight is down 5# to 76# at present (BMI=15); she is asked to take the Megace every day & incr her nutritional supplements to Tid as well, must gain some weight... We reviewed the following medical problems during today's office visit >>     COPD, Hx LungCa, s/p LLLobectomy 1999> on Advair250, NEBS, Mucinex; denies CP, palpit, SOB, edema; CXR 5/14 is stable w/ decr lung vol on left, scarring unchanged, NAD...     GERD> on Prev15Bid, Zofran4 prn; she still has intermit nausea that responds to Zofran; needs to gain weight & this may help...    IBS w/ Constip> on Bentyl & Miralax but won't take it  regularly as advised...    Underweight> she has never been large but BMI is 15-16 & needs to gain wt; she has MegaceQid & asked to incr nutritional supplements to Tid...    Hx Breast Ca> she has been off Tamoxifen since 2008; she needs to get her f/u mammogram at [facility] but she hasn't done this yet...    DJD, Osteoporosis> on Tramadol prn; s/p LLam, left hip fx, right humerus fx, right patella fx, & severe DDD; Ortho per Tammy Sours; prev on Boniva, now Reclast w/ infusion 5/13; BMD due now & next Reclast infusion pending...    Dementia, Neuropathy> CT w/ atrophy & chr sm vessel dis; on Neurontin 300Tid which helps her back pain she says...    Anxiety> hx Etoh in past, on Librium, Zoloft, Remeron; and her daughter helps w/ her meds...    Psoriasis> followed by DrFHouston (now retired) & she is requesting refill of Triamcinolone cream... We reviewed prob list, meds, xrays and labs> see below for updates >>  CXR 5/14 shows norm heart size, prior surg & sm lung vol w/ scarring on left, hyperinflation on right, NAD... LABS 5/14:  Chems- wnl;  CBC- wnl;  TSH=2.30;  VitD=51...  BMD 5/14=> pending  ~  October 19, 2013:  19mo ROV & post hospital check> she was Hosp 10/8 - 09/26/13 by [facility] w/ consult by Kallie Edward for Pulm; she had dense left pneumonia which was slow to clear despite broad spectrum antibiotics; Bronch 10/13 yielded Pseudomoas that was pan-sens and initial Rochepin/Zithro=> changed to Zosyn/Vanco=> & later to Levaquin 750 at discharge... XRay improved but signif resid LLL vol loss & infiltrate remained... Since disch she has improved in the NH for rehab ([facility]) but daugh has lots of concerns including the fact that she is not getting her NEBS regularly & wonders if she can wean off the Oxygen...     COPD, Hx LungCa, s/p LLLobectomy 1999 & Pseudomonas pneumonia 10/14> on Advair250Bid, NEBS w/ AlbutTid (needs this regularly), Mucinex, etc; denies CP, palpit, ch in SOB, edema; Hosp 10/14 w/ Pseudomonas  pneum in left lung, dense consolid, required bronch & adjust in antibiotics to get improvement, disch to [facility] for rehab=> finished Levaquin, we will incr Nebs to regular dosing!    GERD> on Prev15Bid, Zofran4 prn; she still has intermit nausea that responds to Zofran; needs to gain weight & this may help...    IBS w/ Constip> on Bentyl & Miralax but won't take it regularly as advised...    Underweight> she has never been large but BMI is 15-16 & needs to gain wt; she has MegaceQid & asked to incr nutritional supplements to Tid...    Hx Breast Ca> she has been off Tamoxifen since 2008; she needs to get her f/u mammogram at [facility] but she hasn't done this yet...    DJD, Osteoporosis> on Tramadol prn; s/p LLam, left hip fx, right humerus fx, right patella fx, & severe DDD; Ortho per Tammy Sours; prev on Boniva, now Reclast w/ infusion 5/13; BMD f/u 5/14 showed TScores -3.6 in Spine and -3.5 in [facility]; she was given 5mg  IV Reclast on 05/13/13...    Dementia, Neuropathy> CT w/ atrophy & chr sm vessel dis; on Neurontin 300Tid which helps her back pain she says...    Anxiety> hx Etoh in past, on Librium, Zoloft, Remeron; and her daughter helps w/ her meds prior to the NH...    Psoriasis> followed by DrFHouston (now retired) & she is requesting refill of Triamcinolone cream... We reviewed prob list, meds, xrays and labs> see below for updates >>  2DEcho 10/14 in hosp showed norm LV size & function w/ EF=60-65%, norm wall motion, Gr1DD, mild AI, mild post leaflet MVP, otherw neg... CXR 11/14 shows further improvement in left lung aeration w/ some resid atelec, infiltrate, scarring, & vol loss at the left base...   ~  November 25, 2013:  46mo ROV & add-on appt> pt seen 10/19/13 s/p hosp for severe left pneumonia w/ lavage fluid +Pseudomonas (pan sens)- treated w/ Zosyn & disch on Levaquin; when seen 11/12 she was improved at a new baseline, in NH for rehab, on NEBS w/ AlbutTid & Advair250Bid; they called 12/4 w/  incr confusion, CXR in NH reported to show "an area in LLL", WBC=13K; DrPaterson rec Rocephin but based on prev cult I rec adding Cipro500Bid; NH records indicate she received Rocephin & Vanco IV from 12/4-12/13; she is still congested w/ left basilar rhonchi, some exp wheezing, scant beige sput w/o blood; Temp here is 98.3; family is still concerned & we repeated CXR today= same as 10/19/13 film w/ stable LLL aeration, post op changes on left, scarring left base, clear right lung;  I suspect she has bronchiectasis in the left base  and as such the broad spectrum antibiotic Rx may have helped; suggest obtaining sput for C&S, continue Advair250Bid & NEBS w/ AlbutTid, add Mucinex600-2Bid, Fluids, and Prednisone starting w/ 20mg /d & tapering slowly to 10Qod...   ~  January 03, 2014:  6wk ROV & post hosp check> She was Adm by [facility] 12/22 - 12/03/13 after presenting w/ worsening chest congestion & dyspnea; she had not responded to IV Roceph/ Vanco and oral Pred in the NH; she was prev adm 10/14 w/ severe left lung Pseudomonas pneumonia requiring bronch w/ lavage by DrAlva (pan-sens pseudomonas grew at that time) & she reponded to Zosyn=>Levaquin; disch to [facility] where she remains at present; CXR showed COPD w/ chr changes on left, clear right, NAD; cultures grew same pan-sens Pseudomonas and she was Flu-neg, AFB/ Fungal neg, etc; she was covered w/ Zosyn again & switched back to Cipro at disch; she has finished the antibiotics and continues to improve from the resp standpoint;  She has underlying COPD/ bronchiectasis & I suspect colonized w/ pseudomonas;  Rec weaning the Pred slowly (DC'd on 20mg /d, currently at 15mg /d for 3wks, rec to cut to 10mg /d, then slowly to 5/d then 5Qod per DrGates at [facility]), continuing Nebs w/ Albut prn, Advair250Bid, Mucinex Bid, Fluids, Tessalon perles as needed...    She apparently developed pedal edema after disch and DrPaterson started her on Lasix40 + Aldactone25 daily; her edema has  resolved and she feels better; they are monitoring her electrolytes and will adjust as appropriate; her prev Amlodipine2.5 was stopped and her BP is 124/62 today...     Overall she looks better from her time in [facility] w/ the PT, nursing care, attention from staff, etc; daughter is very pleased and hoping to continue her care there... We reviewed prob list, meds, xrays and labs> see below for updates >>  CXR 11/25/13 >> COPD, chronic changes on left, clear right lung, NAD...           Problem List:      HEARING LOSS (ICD-389.9) - she's prev refused hearing evals but now has a assist device issued to her from [facility]...  COPD (ICD-496) - ex-smoker... on ALBUT NEBS up to Qid prn, ADVAIR 250Bid, & Mucinex/ Tessalon/ etc... PSEUDOMONAS PNEUMONIA in LLL 10/14 >>  ~  CTChest 6/06 w/ stable post-op changes on left, no recurrence, scarring RLL, sm gallstones... ~  CXR 8/08 w/ vol loss & post-op changes on left, right clear...  ~  CXR 9/09 showed stable post op changes on the left, NAD.Marland Kitchen. ~  CXR 9/10 unchanged- stable post op appearance of left chest, NAD.Marland Kitchen. ~  CXR 8/11 showed post op changes & scarring, COPD, NAD.Marland Kitchen. ~  CXR 6/12 in [place] showed chr changes w/ scarring & blunt angle on left, hyperinflation on right, diffuse osteopenia, NAD.Marland Kitchen. ~  CXR 5/13 showed stable post-op changes/ vol loss/ scarring on left, atherosclerotic calcif in Ao, COPD changes, NAD.Marland Kitchen. ~  CXR 5/14 shows norm heart size, prior surg & sm lung vol w/ scarring on left, hyperinflation on right, NAD... ~  10/14:  Hosp w/ Left lung pneumonia, required bronch & lavage from DrAlva=> Pseudomonas (pan-sens) & treated w/ Zosyn=> po Levaquin750/d; sent to [facility] for rehab... ~  CT Chest 10/14 showed consolidation in left lung w/ endobronch debris, dil of the ascend ThorAo, enlarged main PA, coronary calcif, no adenopathy... ~  11/14:  Post-hosp check & improved clinically & CXR w/ better aeration/ improved; daugh wants nebs Tid  regularly- ok... ~  12/14: add-on after getting IV Rocephin & Vanco in NH for sl confusion, WBC=13K, and "an area in the LLL on CXR; clinically improved but w/ resid congestion, cough, etc; CXR at baseline w/ LLL atx, scarring; Rec to check sput, Add Mucinex, Pred taper... ~  12/14: she was hosp 12/22-27/14 w/ worsening chest congestion & dyspnea; covered w/ antibiotics, Prednisone, Nebs, etc & improved; disch back to [facility] on Pred 20/d, Cipro, Advair, Nebs, Mucinex, Tessalon... ~  1/15: Improved back to baseline w/ Rx from DrGates/Paterson at [facility]; they are weaning Pred & stopped her Amlopdipine in favor of Lasix/ Aldactone for her edema & BP...  Hx of CARCINOMA, LUNG, SQUAMOUS CELL (ICD-162.9) - s/p left thoracotomy w/ LLLobectomy for squamous cell ca 10/99 by DrBurney... no known recurrence.  GERD (ICD-530.81) - prev on Prevacid OTC... last EGD was 11/99 showing GERD, otherw neg... 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  note: she states the Prevacid really helps and the generic Omeprazole didn't help... ~  8/13:  We added ZOFRAN 4mg  Q4H as needed for nausea... ~  11/13:  She requests to incr her PREVACID 15mg  to Bid- OK...  IBS- Constipation  >>  Prev on BENTYL 20mg  prn...last colonoscopy was 7/03 by DrPerry & was normal- no pathologic findings... ~  5/13:  Asked to start Miralax 1 capful in water daily...  GALLSTONES (ICD-574.20)  Hx of ALCOHOLISM >>  UNDERWEIGHT >>  PROTEIN-CALORIE MALNUTRITION >>  ~  Hosp 10/14 w/ Pseudomonas Pneumonia and Alb ranged 2.1 - 2.5; we reviewed Megace Qid for appetitie and Nutritional supplements for wt gain...  Hx of ADENOCARCINOMA, BREAST (ICD-174.9) - DrMagrinat stopped her Tamoxifen after 31yrs and released her on 12/08... she had a left breast lumpectomy and sentinel node biopsy 12/03 by [facility] for a 2.2cm infiltrating carcinoma, neg LN's, ER/PR pos, treated w/ XRT, then  Tamoxifen for 47yrs... ~  Mammogram 5/09 was negative... ~  Mammogram 5/10 at [facility] was neg- fatty replacement, post-op changes... ~  She is overdue for f/u mammogram & will call [facility] at her convenience- reminded again...  DEGENERATIVE JOINT DISEASE (ICD-715.90) - she's had a prev lumbar laminectomy & a fractured left hip after a fall (repaired by DrDuda [id number])... ~  CSpine CTNeck 6/12 after fall showed severe DDD & facet dis, and anterolisthesis at several levels... ~  Fall at home 6/12 w/ fx right humerus & fx right patella> treated by DrDuda in [place] rehab...  OSTEOPOROSIS (ICD-733.00) - she was prev on Boniva150mg /month along w/ calcium and vitD supplements==> given IV RECLAST 05/07/12. ~  BMD 8/08 shows severe osteoporosis w/ TScores -3.5 in the spine and -3.7 in the hip...  improved from 5/06 study! ~  Vit D level 3/09 = 30 & 50K/wk Rx started...  ~  Vit D level 9/09 = 66 & switched to 1000u OTC daily... ~  BMD 10/10 showed TScores -3.3 Spine, and -3.1 in right fem neck... sl improved from 2008, continue Rx, avoid trauma. ~  Labs in [facility] 6/12 included Vit D level = 68 ~  Labs 5/13 showed VitD level = 61 ~  5/13:  She does not want to take Boniva any longer & is asking for IV med alternative> we will look into RECLAST eligibility for her==> given 1st dose IV RECLAST 05/07/12... ~  BMD 5/14 showed TScores -3.6 in Spine and -3.5 in [facility]; she was given 5mg  IV Reclast on 05/13/13.Marland KitchenMarland Kitchen  NEUROPATHY (ICD-355.9) - on NEURONTIN 300mg  Tid... it really helps her back pain.  Early Dementia >> ~  CT Brain 6/12 in ER after fall showed atrophy & chr sm vessel dis, NAD...  ANXIETY (ICD-300.00) - on LIBRIUM 10mg tid, ZOLOFT 100mg /d and REMERON 15mg Qhs... she wishes to continue all of these the same.  PSORIASIS (ICD-696.1) -  treated by Brunetta Jeans w/ MTX- intol pills, prev on shots... ~  5/14:  She requests refill Triamcinolone cream now that Brunetta Jeans has  retired...  ANEMIA (ICD-285.9) - she is rec to take Women's MVI, Vit B12 1055mcg/d, Vit D 1000 u/d... ~  labs 3/09 showed Hg= 11.9 w/ Fe= 67... started Feosol 200mg /d... ~  labs 9/09 showed Hg= 13.3 w/ Fe= 84 ~  labs 9/10 showed Hg= 13.3, Fe= 111... pt stopped the Fe supplement. ~  labs 8/11 showed Hg= 12.9, Fe= 94 (23%sat), B12= 302 ~  Labs 6/12 in [place] showed Hg= 12.9==>10.4 ~  Labs 5/13 showed Hg= 13.3 ~  Labs 5/14 showed Hg= 13.9 ~  Labs 10/14 in [place] w/ pneumonia> Hg dropped to 9.7.Marland KitchenMarland Kitchen  HEALTH MAINTENANCE:  She takes a lot of Vitamins> B12, D, E, MVI, calcium etc... Plus nutritional supplements- Ensure, Boost, etc...   Past Surgical History  Procedure Laterality Date  . Lumbar laminectomy    . 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 Left lung surgery with lllobectomy for squamous cell cancer  09/1998    Dr. Arlyce Dice  . Left breast lumpectomy with sentinel lymph node biopsy    . Left femur fracture repaired  04/2004    Dr. Sharol Given  . Video bronchoscopy Bilateral 09/19/2013    Procedure: VIDEO BRONCHOSCOPY WITH FLUORO;  Surgeon: Rigoberto Noel, MD;  Location: [facility];  Service: Cardiopulmonary;  Laterality: Bilateral;    Outpatient Encounter Prescriptions as of 01/03/2014  Medication Sig  . albuterol (PROVENTIL) (2.5 MG/3ML) 0.083% nebulizer solution Use 1 vial in nebulizer up to 3 times daily as needed FILE UNDER MCR PART B  . amLODipine (NORVASC) 2.5 MG tablet Take 2.5 mg by mouth daily.  Marland Kitchen amoxicillin-clavulanate (AUGMENTIN) 500-125 MG per tablet Take 1 tablet (500 mg total) by mouth 2 (two) times daily.  . benzonatate (TESSALON) 100 MG capsule Take 1 capsule (100 mg total) by mouth 3 (three) times daily.  . bisacodyl (DULCOLAX) 10 MG suppository Place 1 suppository (10 mg total) rectally daily as needed for moderate constipation.  . budesonide (PULMICORT) 0.5 MG/2ML nebulizer solution Take 2 mLs (0.5 mg total) by nebulization 2 (two) times daily.   . calcium carbonate (OS-CAL) 600 MG TABS Take 600 mg by mouth 2 (two) times daily with a meal.    . chlordiazePOXIDE (LIBRIUM) 10 MG capsule Take 1 capsule (10 mg total) by mouth 3 (three) times daily.  . cholecalciferol (VITAMIN D) 1000 UNITS tablet Take 1,000 Units by mouth daily.   . ciprofloxacin (CIPRO) 500 MG tablet Take 1 tablet (500 mg total) by mouth 2 (two) times daily.  . cyanocobalamin 2000 MCG tablet Take 1 tablet (2,000 mcg total) by mouth daily.  . ferrous sulfate 325 (65 FE) MG tablet Take 325 mg by mouth daily with breakfast.  . Fluticasone-Salmeterol (ADVAIR DISKUS) 250-50 MCG/DOSE AEPB INHALE 1 PUFF TWICE A DAY  . gabapentin (NEURONTIN) 300 MG capsule Take 300 mg by mouth 3 (three) times daily.  Marland Kitchen guaiFENesin (MUCINEX) 600 MG 12 hr tablet Take 600 mg by mouth 2 (two) times daily.  . lansoprazole (PREVACID) 15  MG capsule Take 1 capsule (15 mg total) by mouth 2 (two) times daily.  Marland Kitchen loratadine (CLARITIN) 10 MG tablet Take 10 mg by mouth daily.  . megestrol (MEGACE) 40 MG/ML suspension Take 200 mg by mouth 4 (four) times daily.   . mirtazapine (REMERON) 15 MG tablet Take 1 tablet (15 mg total) by mouth at bedtime as needed.  . Multiple Vitamins-Minerals (CENTRUM SILVER PO) Take 1 tablet by mouth daily.    . predniSONE (DELTASONE) 20 MG tablet Take 1 tablet (20 mg total) by mouth daily with breakfast.  . Probiotic Product (ALIGN) 4 MG CAPS Take 1 capsule by mouth daily.  . sertraline (ZOLOFT) 100 MG tablet Take 150 mg by mouth daily.  . traMADol (ULTRAM) 50 MG tablet Take 1 tablet (50 mg total) by mouth 3 (three) times daily.  . vitamin E 400 UNIT capsule Take 400 Units by mouth daily.      Allergies  Allergen Reactions  . Fentanyl Other (See Comments)    Drops blood pressure, not allergy, just side effect    Current Medications, Allergies, Past Medical History, Past Surgical History, Family History, and Social History were reviewed in [employer]  record.    Review of Systems        See HPI - all other systems neg except as noted...  The patient complains of dyspnea on exertion and muscle weakness.  The patient denies anorexia, fever, weight loss, weight gain, vision loss, decreased hearing, hoarseness, chest pain, syncope, peripheral edema, prolonged cough, headaches, hemoptysis, abdominal pain, melena, hematochezia, severe indigestion/heartburn, hematuria, incontinence, suspicious skin lesions, transient blindness, difficulty walking, depression, unusual weight change, abnormal bleeding, enlarged lymph nodes, and angioedema.     Objective:   Physical Exam    WD, Thin, 80 y/o WF in NAD... she is chr ill appearing & deaf... GENERAL:  Alert, pleasant & cooperative... HEENT:  [place]/AT, EOM-wnl, PERRLA, EACs-clear, TMs-wnl, NOSE-clear, THROAT-clear & wnl. NECK:  Supple w/ fairROM; no JVD; normal carotid impulses w/o bruits; no thyromegaly or nodules palpated; no lymphadenopathy. CHEST:  Decr BS on left; prev left thoracotomy scar; without wheezes/ rales/ or rhonchi heard... HEART:  Regular Rhythm; without murmurs/ rubs/ or gallops detected... ABDOMEN:  Soft & nontender; normal bowel sounds; no organomegaly or masses palpated... EXT: without deformities, mild arthritic changes; no varicose veins/ venous insuffic/ or edema. NEURO:  CN's intact; no focal neuro deficits x mild neuropathy... DERM:  No lesions noted; no rash etc...  RADIOLOGY DATA:  Reviewed in the EPIC EMR & discussed w/ the patient...  LABORATORY DATA:  Reviewed in the EPIC EMR & discussed w/ the patient...   Assessment & Plan:    COPD/ Emphysema/ Hx Pseudomonas pneumonia left lung 10/14>  On Pred, NEBS, Advair, Mucinex, Fluids, Tessalon, etc... Rec to slowly wean Pred, & continue the other meds...  Hx Lung Cancer>  S/p LLLobectomy 1999 & no known recurrence; CXRs and scans reviewed...  GERD>  On OTC PPI Rx as needed; she denies swallowing difficulty etc, just poor  appetite; rec supplements...  IBS>  Prev on Bentyl & Miralax as needed...  Hx Breast Cancer>  Left breast ca w/ surg 2003, then Tamoxifen & released by DrMagrinat in 2008; no known recurrence to date...  DJD>  Fall at home 6/12 w/ Fx rt arm & patella; attended by DrDuda & improved toward baseline; she uses Tylenol/ Tramadol for pain...  Osteoporosis>  Prev on Boniva, but she received her 1st dose of IV Reclast 05/07/12 &  her 2nd dose 05/13/13; BMD showed TScores -3.6 in spine & -3.5 in FemNecks...  Neuro> Dementia, Neuropathy>  On Neurontin, we reviewed [facility]Brain [facility]...  Anxiety>  She remains quite anxious & well cared for by her daughter; now doing quite well at [facility]; continue same meds...  Anemia>  Hg improved to 11.6 during 12/14 hosp...   Patient's Medications  New Prescriptions   No medications on file  Previous Medications   AMLODIPINE (NORVASC) 2.5 MG TABLET    Take 2.5 mg by mouth daily.   BENZONATATE (TESSALON) 100 MG CAPSULE    Take 1 capsule (100 mg total) by mouth 3 (three) times daily.   BISACODYL (DULCOLAX) 10 MG SUPPOSITORY    Place 1 suppository (10 mg total) rectally daily as needed for moderate constipation.   CALCIUM CARBONATE (OS-CAL) 600 MG TABS    Take 600 mg by mouth 2 (two) times daily with a meal.     CHLORDIAZEPOXIDE (LIBRIUM) 10 MG CAPSULE    Take 1 capsule (10 mg total) by mouth 3 (three) times daily.   CHOLECALCIFEROL (VITAMIN D) 1000 UNITS TABLET    Take 1,000 Units by mouth daily.    CYANOCOBALAMIN 2000 MCG TABLET    Take 1 tablet (2,000 mcg total) by mouth daily.   FERROUS SULFATE 325 (65 FE) MG TABLET    Take 325 mg by mouth daily with breakfast.   FLUTICASONE-SALMETEROL (ADVAIR DISKUS) 250-50 MCG/DOSE AEPB    INHALE 1 PUFF TWICE A DAY   GABAPENTIN (NEURONTIN) 300 MG CAPSULE    Take 300 mg by mouth 3 (three) times daily.   GUAIFENESIN (MUCINEX) 600 MG 12 HR TABLET    Take 600 mg by mouth 2 (two) times daily.   LANSOPRAZOLE (PREVACID) 15 MG  CAPSULE    Take 1 capsule (15 mg total) by mouth 2 (two) times daily.   LORATADINE (CLARITIN) 10 MG TABLET    Take 10 mg by mouth daily.   MEGESTROL (MEGACE) 40 MG/ML SUSPENSION    Take 200 mg by mouth 4 (four) times daily.    MIRTAZAPINE (REMERON) 15 MG TABLET    Take 1 tablet (15 mg total) by mouth at bedtime as needed.   MULTIPLE VITAMINS-MINERALS (CENTRUM SILVER PO)    Take 1 tablet by mouth daily.     PREDNISONE (DELTASONE) 10 MG TABLET    Take 10 mg by mouth daily with breakfast. Wean per Dr. Inda Merlin   PROBIOTIC PRODUCT (ALIGN) 4 MG CAPS    Take 1 capsule by mouth daily.   SERTRALINE (ZOLOFT) 100 MG TABLET    Take 150 mg by mouth daily.   TRAMADOL (ULTRAM) 50 MG TABLET    Take 1 tablet (50 mg total) by mouth 3 (three) times daily.   VITAMIN E 400 UNIT CAPSULE    Take 400 Units by mouth daily.    Modified Medications   Modified Medication Previous Medication   ALBUTEROL (PROVENTIL) (2.5 MG/3ML) 0.083% NEBULIZER SOLUTION albuterol (PROVENTIL) (2.5 MG/3ML) 0.083% nebulizer solution      Use 1 vial in nebulizer  3 times daily as needed for wheezing FILE UNDER MCR PART B    Use 1 vial in nebulizer up to 3 times daily as needed FILE UNDER MCR PART B  Discontinued Medications   AMOXICILLIN-CLAVULANATE (AUGMENTIN) 500-125 MG PER TABLET    Take 1 tablet (500 mg total) by mouth 2 (two) times daily.   BUDESONIDE (PULMICORT) 0.5 MG/2ML NEBULIZER SOLUTION    Take 2 mLs (  0.5 mg total) by nebulization 2 (two) times daily.   CIPROFLOXACIN (CIPRO) 500 MG TABLET    Take 1 tablet (500 mg total) by mouth 2 (two) times daily.   PREDNISONE (DELTASONE) 20 MG TABLET    Take 1 tablet (20 mg total) by mouth daily with breakfast.

## 2014-01-03 NOTE — Patient Instructions (Signed)
Today we updated your med list in our EPIC system...    We decided to wean your Prednisone to 10mg  daily...    We will leave it to [facility] to slowly wean down further over time...  Continue the Advair, Nebulizer w/ albuterol, Mucinex, Fluids, and Tessalon as needed...  We will leave it to [facility] to monitor your labs on the Lasix and Aldactone & wean this down as able...  Call for any questions and if I can be of service in any way.Marland KitchenMarland Kitchen

## 2014-06-05 ENCOUNTER — Telehealth: Payer: Self-pay | Admitting: Pulmonary Disease

## 2014-06-05 NOTE — Telephone Encounter (Signed)
Daughter calling back again changes on cxr

## 2014-06-05 NOTE — Telephone Encounter (Signed)
Called spoke with pt's daughter Nevin Bloodgood who reported that pt was seen by her PCP Dr Sharlett Iles w/ [place] on 6/28 at the [facility]; had a mobile cxr done that showed improved pneumonia but new Left hemothorax and some atelectasis.  Dr Sharlett Iles would like pt seen by pulmonary for treatment; per nursing staff at [place] pt is clinically stable on 3lpm.  Nevin Bloodgood is off work tomorrow and would like pt to be seen then if at all possible.  Spoke with Marliss Czar, okay to add on to [facility] tomorrow 6/30 >> appt scheduled for 1030.  Nevin Bloodgood okay with this time.  Called [facility] @ [phone] and spoke with pt's nurse Joy >> mobile cxr report and Dr Buel Ream note to be faxed to triage for SN.  **notes received and given to [facility].    Will sign off.

## 2014-06-06 ENCOUNTER — Ambulatory Visit (INDEPENDENT_AMBULATORY_CARE_PROVIDER_SITE_OTHER): Payer: Medicare Other | Admitting: Pulmonary Disease

## 2014-06-06 ENCOUNTER — Encounter: Payer: Self-pay | Admitting: Pulmonary Disease

## 2014-06-06 VITALS — BP 92/60 | HR 90 | Wt 82.0 lb

## 2014-06-06 DIAGNOSIS — E440 Moderate protein-calorie malnutrition: Secondary | ICD-10-CM

## 2014-06-06 DIAGNOSIS — C3490 Malignant neoplasm of unspecified part of unspecified bronchus or lung: Secondary | ICD-10-CM

## 2014-06-06 DIAGNOSIS — M810 Age-related osteoporosis without current pathological fracture: Secondary | ICD-10-CM

## 2014-06-06 DIAGNOSIS — R413 Other amnesia: Secondary | ICD-10-CM

## 2014-06-06 DIAGNOSIS — K219 Gastro-esophageal reflux disease without esophagitis: Secondary | ICD-10-CM

## 2014-06-06 DIAGNOSIS — J9819 Other pulmonary collapse: Secondary | ICD-10-CM

## 2014-06-06 DIAGNOSIS — J9811 Atelectasis: Secondary | ICD-10-CM

## 2014-06-06 DIAGNOSIS — C50919 Malignant neoplasm of unspecified site of unspecified female breast: Secondary | ICD-10-CM

## 2014-06-06 DIAGNOSIS — R627 Adult failure to thrive: Secondary | ICD-10-CM

## 2014-06-06 DIAGNOSIS — J441 Chronic obstructive pulmonary disease with (acute) exacerbation: Secondary | ICD-10-CM

## 2014-06-06 DIAGNOSIS — M1990 Unspecified osteoarthritis, unspecified site: Secondary | ICD-10-CM

## 2014-06-06 NOTE — Patient Instructions (Signed)
Today we updated your med list in our EPIC system...    Continue your current medications the same...  We discussed the options avail for treatment>>    We decided on a middle-of-the-road approach w/ Levaquin, NEBS, Flutter valve therapy, courage cough & deep breathing, etc (continue Pred, Mucinex, Fluids)...  Let's plan a recheck in 3 wks w/ CXR at that time...  Call for any questions.Marland KitchenMarland Kitchen

## 2014-06-06 NOTE — Progress Notes (Signed)
Subjective:     Patient ID: Leslie [name], female   DOB: [birth year], 80 y.o.   MRN: [id number] HPI Review of Systems Physical Exam   Patient ID: Leslie [name], female    DOB: Mar 12, 1929, 79 y.o.   MRN: [id number]  HPI 80 y/o WF here for a 6 month follow up visit... she has mult medical problems as listed...   ~  October 15, 2012:  45mo ROV & Mckenzi has gained 2# to 82# today taking Megace but just 1tsp daily (rec to incr to Qid as prescribed); she also notes that Meclizine has helped dizziness, & Zofran helped the nausea; she wants to incr her Prevacid 15mg  to Bid- ok...    COPD, Hx LungCa, s/p LLLobectomy 1999> on Advair250, NEBS, Mucinex; CXR 5/13 w/ chr post op changes, NAD...    GERD> on Prev15; wants to incr to Bid as above- ok...    IBS w/ Constip> on Bentyl & Miralax but won't take it regularly as advised...    Hx Breast Ca> she has been off Tamoxifen since 2008; she needs to get her f/u mammogram at [facility]...    DJD, Osteoporosis> s/p LLam, left hip fx, right humerus fx, right patella fx, & severe DDD; Ortho per DrDuda; prev on boniva, now Recleast w/ infusion 5/13...    Dementia, Neuropathy> CT w/ atrophy 7 chr sm vessel dis; on Neurontin 300Tid    Anxiety> hx Etoh in past, on Librium, Zoloft, Remeron...    Psoriasis> followed by DrFHouston... We reviewed prob list, meds, xrays and labs> see below for updates >>   ~  Apr 15, 2013:  49mo ROV & [name] says she feels OK, appetite is fair & helped by Megace but her weight is down 5# to 76# at present (BMI=15); she is asked to take the Megace every day & incr her nutritional supplements to Tid as well, must gain some weight... We reviewed the following medical problems during today's office visit >>     COPD, Hx LungCa, s/p LLLobectomy 1999> on Advair250, NEBS, Mucinex; denies CP, palpit, SOB, edema; CXR 5/14 is stable w/ decr lung vol on left, scarring unchanged, NAD...     GERD> on Prev15Bid, Zofran4 prn; she still has intermit nausea  that responds to Zofran; needs to gain weight & this may help...    IBS w/ Constip> on Bentyl & Miralax but won't take it regularly as advised...    Underweight> she has never been large but BMI is 15-16 & needs to gain wt; she has MegaceQid & asked to incr nutritional supplements to Tid...    Hx Breast Ca> she has been off Tamoxifen since 2008; she needs to get her f/u mammogram at [facility] but she hasn't done this yet...    DJD, Osteoporosis> on Tramadol prn; s/p LLam, left hip fx, right humerus fx, right patella fx, & severe DDD; Ortho per Tammy Sours; prev on Boniva, now Reclast w/ infusion 5/13; BMD due now & next Reclast infusion pending...    Dementia, Neuropathy> CT w/ atrophy & chr sm vessel dis; on Neurontin 300Tid which helps her back pain she says...    Anxiety> hx Etoh in past, on Librium, Zoloft, Remeron; and her daughter helps w/ her meds...    Psoriasis> followed by DrFHouston (now retired) & she is requesting refill of Triamcinolone cream... We reviewed prob list, meds, xrays and labs> see below for updates >>   CXR 5/14 shows norm heart size, prior surg & sm lung vol w/  scarring on left, hyperinflation on right, NAD...  LABS 5/14:  Chems- wnl;  CBC- wnl;  TSH=2.30;  VitD=51...   BMD 5/14=> pending  ~  October 19, 2013:  3mo ROV & post hospital check> she was Hosp 10/8 - 09/26/13 by [facility] w/ consult by Kallie Edward for Pulm; she had dense left pneumonia which was slow to clear despite broad spectrum antibiotics; Bronch 10/13 yielded Pseudomoas that was pan-sens and initial Rochepin/Zithro=> changed to Zosyn/Vanco=> & later to Levaquin 750 at discharge... XRay improved but signif resid LLL vol loss & infiltrate remained... Since disch she has improved in the NH for rehab ([facility]) but daugh has lots of concerns including the fact that she is not getting her NEBS regularly & wonders if she can wean off the Oxygen...     COPD, Hx LungCa, s/p LLLobectomy 1999 & Pseudomonas pneumonia 10/14> on  Advair250Bid, NEBS w/ AlbutTid (needs this regularly), Mucinex, etc; denies CP, palpit, ch in SOB, edema; Hosp 10/14 w/ Pseudomonas pneum in left lung, dense consolid, required bronch & adjust in antibiotics to get improvement, disch to [facility] for rehab=> finished Levaquin, we will incr Nebs to regular dosing!    GERD> on Prev15Bid, Zofran4 prn; she still has intermit nausea that responds to Zofran; needs to gain weight & this may help...    IBS w/ Constip> on Bentyl & Miralax but won't take it regularly as advised...    Underweight> she has never been large but BMI is 15-16 & needs to gain wt; she has MegaceQid & asked to incr nutritional supplements to Tid...    Hx Breast Ca> she has been off Tamoxifen since 2008; she needs to get her f/u mammogram at [facility] but she hasn't done this yet...    DJD, Osteoporosis> on Tramadol prn; s/p LLam, left hip fx, right humerus fx, right patella fx, & severe DDD; Ortho per Tammy Sours; prev on Boniva, now Reclast w/ infusion 5/13; BMD f/u 5/14 showed TScores -3.6 in Spine and -3.5 in [facility]; she was given 5mg  IV Reclast on 05/13/13...    Dementia, Neuropathy> CT w/ atrophy & chr sm vessel dis; on Neurontin 300Tid which helps her back pain she says...    Anxiety> hx Etoh in past, on Librium, Zoloft, Remeron; and her daughter helps w/ her meds prior to the NH...    Psoriasis> followed by DrFHouston (now retired) & she is requesting refill of Triamcinolone cream... We reviewed prob list, meds, xrays and labs> see below for updates >>   2DEcho 10/14 in hosp showed norm LV size & function w/ EF=60-65%, norm wall motion, Gr1DD, mild AI, mild post leaflet MVP, otherw neg...  CXR 11/14 shows further improvement in left lung aeration w/ some resid atelec, infiltrate, scarring, & vol loss at the left base...   ~  November 25, 2013:  71mo ROV & add-on appt> pt seen 10/19/13 s/p hosp for severe left pneumonia w/ lavage fluid +Pseudomonas (pan sens)- treated w/ Zosyn & disch  on Levaquin; when seen 11/12 she was improved at a new baseline, in NH for rehab, on NEBS w/ AlbutTid & Advair250Bid; they called 12/4 w/ incr confusion, CXR in NH reported to show "an area in LLL", WBC=13K; DrPaterson rec Rocephin but based on prev cult I rec adding Cipro500Bid; NH records indicate she received Rocephin & Vanco IV from 12/4-12/13; she is still congested w/ left basilar rhonchi, some exp wheezing, scant beige sput w/o blood; Temp here is 98.3; family is still concerned & we repeated CXR  today= same as 10/19/13 film w/ stable LLL aeration, post op changes on left, scarring left base, clear right lung;  I suspect she has bronchiectasis in the left base and as such the broad spectrum antibiotic Rx may have helped; suggest obtaining sput for C&S (she could not collect), continue Advair250Bid & NEBS w/ AlbutTid, add Mucinex600-2Bid, Fluids, and Prednisone starting w/ 20mg /d & tapering slowly to 10Qod...   ~  January 03, 2014:  6wk ROV & post hosp check> She was Adm by [facility] 12/22 - 12/03/13 after presenting w/ worsening chest congestion & dyspnea; she had not responded to IV Roceph/ Vanco and oral Pred in the NH; she was prev adm 10/14 w/ severe left lung Pseudomonas pneumonia requiring bronch w/ lavage by DrAlva (pan-sens pseudomonas grew at that time) & she reponded to Zosyn=>Levaquin; disch to [facility] where she remains at present; CXR showed COPD w/ chr changes on left, clear right, NAD; cultures grew same pan-sens Pseudomonas and she was Flu-neg, AFB/ Fungal neg, etc; she was covered w/ Zosyn again & switched back to Cipro at disch; she has finished the antibiotics and continues to improve from the resp standpoint;  She has underlying COPD/ bronchiectasis & I suspect colonized w/ pseudomonas;  Rec weaning the Pred slowly (DC'd on 20mg /d, currently at 15mg /d for 3wks, rec to cut to 10mg /d, then slowly to 5/d then 5Qod per DrGates at [facility]), continuing Nebs w/ Albut prn, Advair250Bid, Mucinex Bid,  Fluids, Tessalon perles as needed...    She apparently developed pedal edema after disch and DrPaterson started her on Lasix40 + Aldactone25 daily; her edema has resolved and she feels better; they are monitoring her electrolytes and will adjust as appropriate; her prev Amlodipine2.5 was stopped and her BP is 124/62 today...     Overall she looks better from her time in [facility] w/ the PT, nursing care, attention from staff, etc; daughter is very pleased and hoping to continue her care there... We reviewed prob list, meds, xrays and labs> see below for updates >>   CXR 11/25/13 >> COPD, chronic changes on left, clear right lung, NAD...  ~  June 06, 2014:  17mo ROV & asked to recheck pt by DrPaterson, [facility] physician due to worsening CXR>  It is apparent that Kanai has gone downhill over the last few months, daughter describes doing poorly w/ adult FTT- stays in bed, not doing exercise or participating in PT, they stopped her Megace w/ assoc decr in appetite and decr intake according to family, and she has persistent pedal edema;  They report CXR done 05/06/14 (we don't have report or access to the digital image) and treated w/ Levaquin w/ some improvement clinically;  Then she has had progressive worsening, lethargy, weakness, won't take meds, feels sick, ?SOB, mild cough but can't expectorate any phlegm;  DrPaterson checked pt 2d ago (note reviewed) w/ CXR (report available) showing marked worsening from 65mo before w/ white-out on left assoc w/ vol loss on left likely indicating major atelectasis of left lung, right was clear & over expanded; for her part Shaunessy notes sl cough, can't get up any phlegm, and denies CP or dyspnea;  Exam shows BP=92/60 (thin arms), P=90reg, O2sat=95% on 3L/min [place], Wt=82# w/ edema in feet, Chest w/ dullness on left/ decr BS at bases w/ bilat rhonchi & E=A changes in left mid lung, Heart w/ RR Gr1-2SEM at lsb...    We discussed 3 courses of action> Aggressive,  Middle-of-the-road, Palliative;  Family is almost at  the point of Hospice referral but not quite;  Pt is a NCB and they do not want aggressive rx w/ hosp, bronch & lavage, IV antibiotics as before, etc;  They decided on a Middle-of-the-road approach including another round of oral antibiotics (eg-Levaquin), restart regular NEB treatments w/ Albut 2.5mg  tid, followed by attempts at Flutter valve treatments, and encouragement w/ cough & deep breathing exercises; they will continue Pred10, Mucinex Bid & push fluids orally... We will plan ROV in 3wks w/ CXR at that time.  CXR report 06/04/14 from [facility]> marked changes since 05/06/14 film, total opac of left hemithorax w/ sl vol loss & shift most likely due to Atx; right is hyperaerated...   LABS 6/15 from [facility]:  CBC- Hg=14.5, WBC=14.3 w/ 71%segs...           Problem List:      HEARING LOSS (ICD-389.9) - she's prev refused hearing evals but now has a assist device issued to her from [facility]...  COPD (ICD-496) - ex-smoker... on ALBUT NEBS up to Qid prn, ADVAIR 250Bid, & Mucinex/ Tessalon/ etc... PSEUDOMONAS PNEUMONIA in LLL 10/14 >>  ~  CTChest 6/06 w/ stable post-op changes on left, no recurrence, scarring RLL, sm gallstones... ~  CXR 8/08 w/ vol loss & post-op changes on left, right clear...  ~  CXR 9/09 showed stable post op changes on the left, NAD.Marland Kitchen. ~  CXR 9/10 unchanged- stable post op appearance of left chest, NAD.Marland Kitchen. ~  CXR 8/11 showed post op changes & scarring, COPD, NAD.Marland Kitchen. ~  CXR 6/12 in [place] showed chr changes w/ scarring & blunt angle on left, hyperinflation on right, diffuse osteopenia, NAD.Marland Kitchen. ~  CXR 5/13 showed stable post-op changes/ vol loss/ scarring on left, atherosclerotic calcif in Ao, COPD changes, NAD.Marland Kitchen. ~  CXR 5/14 shows norm heart size, prior surg & sm lung vol w/ scarring on left, hyperinflation on right, NAD... ~  10/14:  Hosp w/ Left lung pneumonia, required bronch & lavage from DrAlva=> Pseudomonas (pan-sens) & treated  w/ Zosyn=> po Levaquin750/d; sent to [facility] for rehab... ~  CT Chest 10/14 showed consolidation in left lung w/ endobronch debris, dil of the ascend ThorAo, enlarged main PA, coronary calcif, no adenopathy... ~  11/14:  Post-hosp check & improved clinically & CXR w/ better aeration/ improved; daugh wants nebs Tid regularly- ok... ~  12/14: add-on after getting IV Rocephin & Vanco in NH for sl confusion, WBC=13K, and "an area in the LLL on CXR; clinically improved but w/ resid congestion, cough, etc; CXR at baseline w/ LLL atx, scarring; Rec to check sput, Add Mucinex, Pred taper... ~  12/14: she was hosp 12/22-27/14 w/ worsening chest congestion & dyspnea; covered w/ antibiotics, Prednisone, Nebs, etc & improved; disch back to [facility] on Pred 20/d, Cipro, Advair, Nebs, Mucinex, Tessalon... ~  1/15: Improved back to baseline w/ Rx from DrGates/Paterson at [facility]; they are weaning Pred & stopped her Amlopdipine in favor of Lasix/ Aldactone for her edema & BP... ~  She has been [facility]erson [facility]> had CXR & round of [place] 05/06/14; again noted to be going downhill, FTT, refusing meds and treatments in Jun2015 w/ CXR showing major left atelectasis ()white-out w/ retraction)... ~  6/15: Family opted for conservative approach, did not want Adm, considering Hospice, Rx w/ Levaquin, NEBS Tid, Flutter, encourage cough & deep breathing, and continue Pred, Mucinex, push fluids, etc; prognosis is guarded at best...  Hx of CARCINOMA, LUNG, SQUAMOUS CELL (ICD-162.9) - s/p left thoracotomy  w/ LLLobectomy for squamous cell ca 10/99 by DrBurney... no known recurrence.  GERD (ICD-530.81) - prev on Prevacid OTC... last EGD was 11/99 showing GERD, otherw neg... 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  note: she states the Prevacid really helps and the generic Omeprazole didn't help... ~  8/13:  We added ZOFRAN 4mg  Q4H as  needed for nausea... ~  11/13:  She requests to incr her PREVACID 15mg  to Bid- OK...  IBS- Constipation  >>  Prev on BENTYL 20mg  prn...last colonoscopy was 7/03 by DrPerry & was normal- no pathologic findings... ~  5/13:  Asked to start Miralax 1 capful in water daily...  GALLSTONES (ICD-574.20)  Hx of ALCOHOLISM >>  UNDERWEIGHT >>  PROTEIN-CALORIE MALNUTRITION >>  ~  Hosp 10/14 w/ Pseudomonas Pneumonia and Alb ranged 2.1 - 2.5; we reviewed Megace Qid for appetitie and Nutritional supplements for wt gain...  Hx of ADENOCARCINOMA, BREAST (ICD-174.9) - DrMagrinat stopped her Tamoxifen after 27yrs and released her on 12/08... she had a left breast lumpectomy and sentinel node biopsy 12/03 by [facility] for a 2.2cm infiltrating carcinoma, neg LN's, ER/PR pos, treated w/ XRT, then Tamoxifen for 43yrs... ~  Mammogram 5/09 was negative... ~  Mammogram 5/10 at [facility] was neg- fatty replacement, post-op changes... ~  She is overdue for f/u mammogram & will call [facility] at her convenience- reminded again...  DEGENERATIVE JOINT DISEASE (ICD-715.90) - she's had a prev lumbar laminectomy & a fractured left hip after a fall (repaired by DrDuda [id number])... ~  CSpine CTNeck 6/12 after fall showed severe DDD & facet dis, and anterolisthesis at several levels... ~  Fall at home 6/12 w/ fx right humerus & fx right patella> treated by DrDuda in [place] rehab...  OSTEOPOROSIS (ICD-733.00) - she was prev on Boniva150mg /month along w/ calcium and vitD supplements==> given IV RECLAST 05/07/12. ~  BMD 8/08 shows severe osteoporosis w/ TScores -3.5 in the spine and -3.7 in the hip...  improved from 5/06 study! ~  Vit D level 3/09 = 30 & 50K/wk Rx started...  ~  Vit D level 9/09 = 66 & switched to 1000u OTC daily... ~  BMD 10/10 showed TScores -3.3 Spine, and -3.1 in right fem neck... sl improved from 2008, continue Rx, avoid trauma. ~  Labs in [facility] 6/12 included Vit D level = 68 ~  Labs 5/13 showed VitD level =  61 ~  5/13:  She does not want to take Boniva any longer & is asking for IV med alternative> we will look into RECLAST eligibility for her==> given 1st dose IV RECLAST 05/07/12... ~  BMD 5/14 showed TScores -3.6 in Spine and -3.5 in [facility]; she was given 5mg  IV Reclast on 05/13/13...                        NEUROPATHY (ICD-355.9) - on NEURONTIN 300mg  Tid... it really helps her back pain.  Early Dementia >> ~  CT Brain 6/12 in ER after fall showed atrophy & chr sm vessel dis, NAD...  ANXIETY (ICD-300.00) - on LIBRIUM 10mg tid, ZOLOFT 100mg /d and REMERON 15mg Qhs... she wishes to continue all of these the same.  PSORIASIS (ICD-696.1) -  treated by Brunetta Jeans w/ MTX- intol pills, prev on shots... ~  5/14:  She requests refill Triamcinolone cream now that Brunetta Jeans has retired...  ANEMIA (ICD-285.9) - she is rec to take Women's MVI, Vit B12  1062mcg/d, Vit D 1000 u/d... ~  labs 3/09 showed Hg= 11.9 w/ Fe= 67... started Feosol 200mg /d... ~  labs 9/09 showed Hg= 13.3 w/ Fe= 84 ~  labs 9/10 showed Hg= 13.3, Fe= 111... pt stopped the Fe supplement. ~  labs 8/11 showed Hg= 12.9, Fe= 94 (23%sat), B12= 302 ~  Labs 6/12 in [place] showed Hg= 12.9==>10.4 ~  Labs 5/13 showed Hg= 13.3 ~  Labs 5/14 showed Hg= 13.9 ~  Labs 10/14 in [place] w/ pneumonia> Hg dropped to 9.7.Marland KitchenMarland Kitchen  HEALTH MAINTENANCE:  She takes a lot of Vitamins> B12, D, E, MVI, calcium etc... Plus nutritional supplements- Ensure, Boost, etc...   Past Surgical History  Procedure Laterality Date  . Lumbar laminectomy    . 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 Left lung surgery with lllobectomy for squamous cell cancer  09/1998    Dr. Arlyce Dice  . Left breast lumpectomy with sentinel lymph node biopsy    . Left femur fracture repaired  04/2004    Dr. Sharol Given  . Video bronchoscopy Bilateral 09/19/2013    Procedure: VIDEO BRONCHOSCOPY WITH FLUORO;  Surgeon: Rigoberto Noel, MD;  Location: [facility];  Service: Cardiopulmonary;   Laterality: Bilateral;    Outpatient Encounter Prescriptions as of 06/06/2014  Medication Sig  . acetaminophen (TYLENOL) 325 MG tablet Take 650 mg by mouth every 6 (six) hours as needed.  Marland Kitchen albuterol (PROVENTIL) (2.5 MG/3ML) 0.083% nebulizer solution Use 1 vial in nebulizer  3 times daily as needed for wheezing FILE UNDER MCR PART B  . benzonatate (TESSALON) 100 MG capsule Take 1 capsule (100 mg total) by mouth 3 (three) times daily.  . bisacodyl (DULCOLAX) 10 MG suppository Place 1 suppository (10 mg total) rectally daily as needed for moderate constipation.  . calcium carbonate (OS-CAL) 600 MG TABS Take 600 mg by mouth 2 (two) times daily with a meal.    . chlordiazePOXIDE (LIBRIUM) 10 MG capsule Take 1 capsule (10 mg total) by mouth 3 (three) times daily.  . cholecalciferol (VITAMIN D) 1000 UNITS tablet Take 1,000 Units by mouth daily.   . cyanocobalamin 2000 MCG tablet Take 1 tablet (2,000 mcg total) by mouth daily.  Marland Kitchen dexlansoprazole (DEXILANT) 60 MG capsule Take 60 mg by mouth daily.  . ferrous sulfate 325 (65 FE) MG tablet Take 325 mg by mouth daily with breakfast.  . Fluticasone-Salmeterol (ADVAIR DISKUS) 250-50 MCG/DOSE AEPB INHALE 1 PUFF TWICE A DAY  . furosemide (LASIX) 40 MG tablet Take 40 mg by mouth daily.  Marland Kitchen gabapentin (NEURONTIN) 300 MG capsule Take 300 mg by mouth 3 (three) times daily.  Marland Kitchen guaiFENesin (MUCINEX) 600 MG 12 hr tablet Take 600 mg by mouth 2 (two) times daily.  Marland Kitchen HYDROcodone-acetaminophen (NORCO/VICODIN) 5-325 MG per tablet Take 1 tablet by mouth every 6 (six) hours as needed for moderate pain.  Marland Kitchen loratadine (CLARITIN) 10 MG tablet Take 10 mg by mouth daily.  . mirtazapine (REMERON) 15 MG tablet Take 1 tablet (15 mg total) by mouth at bedtime as needed.  . Multiple Vitamins-Minerals (CENTRUM SILVER PO) Take 1 tablet by mouth daily.    . ondansetron (ZOFRAN) 4 MG tablet Take 4 mg by mouth every 4 (four) hours as needed for nausea or vomiting.  . predniSONE  (DELTASONE) 10 MG tablet Take 10 mg by mouth daily with breakfast. Wean per Dr. Inda Merlin  . sertraline (ZOLOFT) 100 MG tablet Take 150 mg by mouth daily.  Marland Kitchen spironolactone (ALDACTONE) 25 MG  tablet Take 25 mg by mouth daily.  . traMADol (ULTRAM) 50 MG tablet Take 1 tablet (50 mg total) by mouth 3 (three) times daily.  . vitamin E 400 UNIT capsule Take 400 Units by mouth daily.    . Probiotic Product (ALIGN) 4 MG CAPS Take 1 capsule by mouth daily.  . [DISCONTINUED] amLODipine (NORVASC) 2.5 MG tablet Take 2.5 mg by mouth daily.  . [DISCONTINUED] lansoprazole (PREVACID) 15 MG capsule Take 1 capsule (15 mg total) by mouth 2 (two) times daily.  . [DISCONTINUED] megestrol (MEGACE) 40 MG/ML suspension Take 200 mg by mouth 4 (four) times daily.     Allergies  Allergen Reactions  . Fentanyl Other (See Comments)    Drops blood pressure, not allergy, just side effect    Current Medications, Allergies, Past Medical History, Past Surgical History, Family History, and Social History were reviewed in [employer] record.    Review of Systems        See HPI - all other systems neg except as noted...  The patient complains of dyspnea on exertion and muscle weakness.  The patient denies anorexia, fever, weight loss, weight gain, vision loss, decreased hearing, hoarseness, chest pain, syncope, peripheral edema, prolonged cough, headaches, hemoptysis, abdominal pain, melena, hematochezia, severe indigestion/heartburn, hematuria, incontinence, suspicious skin lesions, transient blindness, difficulty walking, depression, unusual weight change, abnormal bleeding, enlarged lymph nodes, and angioedema.     Objective:   Physical Exam    WD, Thin, 80 y/o WF in NAD... she is chr ill appearing & deaf... GENERAL:  Alert, pleasant & cooperative... HEENT:  [place]/AT, EOM-wnl, PERRLA, EACs-clear, TMs-wnl, NOSE-clear, THROAT-clear & wnl. NECK:  Supple w/ fairROM; no JVD; normal carotid impulses w/o  bruits; no thyromegaly or nodules palpated; no lymphadenopathy. CHEST:  Decr BS at bases w/ bilat rhonchi & E=>A changes left mid zone; prev left thoracotomy scar... HEART:  Regular Rhythm; without murmurs/ rubs/ or gallops detected... ABDOMEN:  Soft & nontender; normal bowel sounds; no organomegaly or masses palpated... EXT: without deformities, mild arthritic changes; no varicose veins/ +venous insuffic/ 3+ pedal edema. NEURO:  CN's intact; no focal neuro deficits x mild neuropathy... DERM:  No lesions noted; no rash etc...  RADIOLOGY DATA:  Reviewed in the EPIC EMR & discussed w/ the patient...  LABORATORY DATA:  Reviewed in the EPIC EMR & discussed w/ the patient...   Assessment & Plan:    COPD/ Emphysema/ Bronchiectasis/ Hx Pseudomonas pneumonia left lung 10/14>  On Pred, NEBS, Advair, Mucinex, Fluids, Tessalon, etc... Rec to slowly wean Pred, & continue the other meds...  Hx Lung Cancer>  S/p LLLobectomy 1999 & no known recurrence; CXRs and scans reviewed...  GERD>  On OTC PPI Rx as needed; she denies swallowing difficulty etc, just poor appetite; rec supplements...  IBS>  Prev on Bentyl & Miralax as needed...  Hx Breast Cancer>  Left breast ca w/ surg 2003, then Tamoxifen & released by DrMagrinat in 2008; no known recurrence to date...  DJD>  Fall at home 6/12 w/ Fx rt arm & patella; attended by DrDuda & improved toward baseline; she uses Tylenol/ Tramadol for pain...  Osteoporosis>  Prev on Boniva, but she received her 1st dose of IV Reclast 05/07/12 & her 2nd dose 05/13/13; BMD showed TScores -3.6 in spine & -3.5 in FemNecks...  Neuro> Dementia, Neuropathy>  On Neurontin, we [facility]...  Anxiety>  She remains quite anxious & well cared for by her daughter; now doing quite well at  [facility]; continue same meds...  Anemia>  Hg improved to 11.6 during 12/14 hosp... Hg 6/15 in NH= 14...   Patient's Medications  New Prescriptions   No medications on file   Previous Medications   ACETAMINOPHEN (TYLENOL) 325 MG TABLET    Take 650 mg by mouth every 6 (six) hours as needed.   ALBUTEROL (PROVENTIL) (2.5 MG/3ML) 0.083% NEBULIZER SOLUTION    Use 1 vial in nebulizer  3 times daily as needed for wheezing FILE UNDER MCR PART B   BENZONATATE (TESSALON) 100 MG CAPSULE    Take 1 capsule (100 mg total) by mouth 3 (three) times daily.   BISACODYL (DULCOLAX) 10 MG SUPPOSITORY    Place 1 suppository (10 mg total) rectally daily as needed for moderate constipation.   CALCIUM CARBONATE (OS-CAL) 600 MG TABS    Take 600 mg by mouth 2 (two) times daily with a meal.     CHLORDIAZEPOXIDE (LIBRIUM) 10 MG CAPSULE    Take 1 capsule (10 mg total) by mouth 3 (three) times daily.   CHOLECALCIFEROL (VITAMIN D) 1000 UNITS TABLET    Take 1,000 Units by mouth daily.    CYANOCOBALAMIN 2000 MCG TABLET    Take 1 tablet (2,000 mcg total) by mouth daily.   DEXLANSOPRAZOLE (DEXILANT) 60 MG CAPSULE    Take 60 mg by mouth daily.   FERROUS SULFATE 325 (65 FE) MG TABLET    Take 325 mg by mouth daily with breakfast.   FLUTICASONE-SALMETEROL (ADVAIR DISKUS) 250-50 MCG/DOSE AEPB    INHALE 1 PUFF TWICE A DAY   FUROSEMIDE (LASIX) 40 MG TABLET    Take 40 mg by mouth daily.   GABAPENTIN (NEURONTIN) 300 MG CAPSULE    Take 300 mg by mouth 3 (three) times daily.   GUAIFENESIN (MUCINEX) 600 MG 12 HR TABLET    Take 600 mg by mouth 2 (two) times daily.   HYDROCODONE-ACETAMINOPHEN (NORCO/VICODIN) 5-325 MG PER TABLET    Take 1 tablet by mouth every 6 (six) hours as needed for moderate pain.   LORATADINE (CLARITIN) 10 MG TABLET    Take 10 mg by mouth daily.   MIRTAZAPINE (REMERON) 15 MG TABLET    Take 1 tablet (15 mg total) by mouth at bedtime as needed.   MULTIPLE VITAMINS-MINERALS (CENTRUM SILVER PO)    Take 1 tablet by mouth daily.     ONDANSETRON (ZOFRAN) 4 MG TABLET    Take 4 mg by mouth every 4 (four) hours as needed for nausea or vomiting.   PREDNISONE (DELTASONE) 10 MG TABLET    Take 10 mg by  mouth daily with breakfast. Wean per Dr. Inda Merlin   PROBIOTIC PRODUCT (ALIGN) 4 MG CAPS    Take 1 capsule by mouth daily.   SERTRALINE (ZOLOFT) 100 MG TABLET    Take 150 mg by mouth daily.   SPIRONOLACTONE (ALDACTONE) 25 MG TABLET    Take 25 mg by mouth daily.   TRAMADOL (ULTRAM) 50 MG TABLET    Take 1 tablet (50 mg total) by mouth 3 (three) times daily.   VITAMIN E 400 UNIT CAPSULE    Take 400 Units by mouth daily.    Modified Medications   No medications on file  Discontinued Medications   AMLODIPINE (NORVASC) 2.5 MG TABLET    Take 2.5 mg by mouth daily.   LANSOPRAZOLE (PREVACID) 15 MG CAPSULE    Take 1 capsule (15 mg total) by mouth 2 (two) times daily.   MEGESTROL (MEGACE) 40 MG/ML SUSPENSION  Take 200 mg by mouth 4 (four) times daily.

## 2014-06-07 NOTE — Addendum Note (Signed)
Addended by: Elie Confer on: 06/07/2014 08:54 AM   Modules accepted: Orders

## 2014-06-22 ENCOUNTER — Telehealth: Payer: Self-pay | Admitting: Pulmonary Disease

## 2014-06-22 NOTE — Telephone Encounter (Signed)
Called and spoke with pts daughter and she stated that the pt passed away last 28-Feb-2023.  SN is aware and nothing further is needed.

## 2014-06-27 ENCOUNTER — Ambulatory Visit: Payer: Medicare Other | Admitting: Pulmonary Disease

## 2014-07-08 DEATH — deceased

## 2014-10-11 IMAGING — CT CT HEAD W/O CM
2 series · 16 of 30 positions shown, 20 images · non-contrast
Comparison: Prior CT scan of the head 06/01/2011

CLINICAL DATA: Altered mental status

EXAM:
CT HEAD WITHOUT CONTRAST
TECHNIQUE: Contiguous axial images were obtained from the base of the skull
through the vertex without intravenous contrast.

[Series 2: head w/o · axial · non-contrast · 0.48mm/px · z∈[+1546,+1666]mm · 13 of 30 slices shown, 17 images]
[im 3/30  brain]
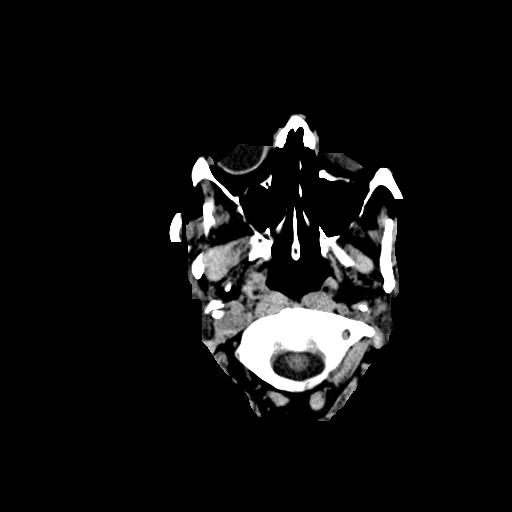
[im 3/30  bone]
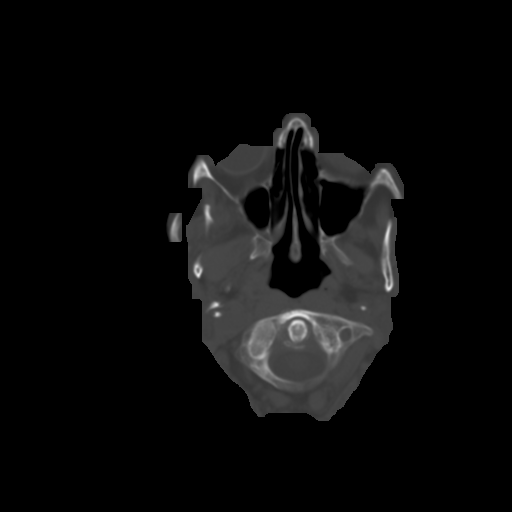
[im 5/30  brain]
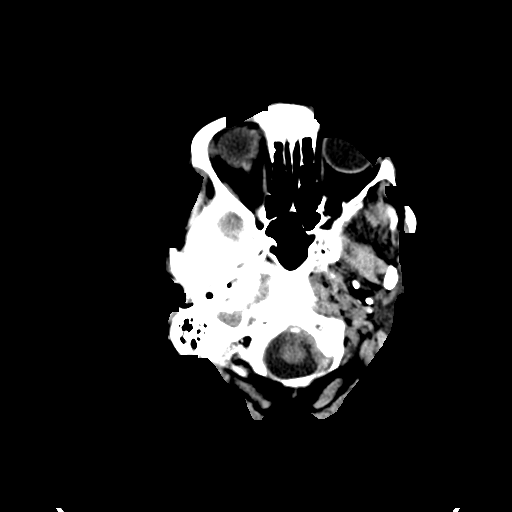
[im 7/30  brain]
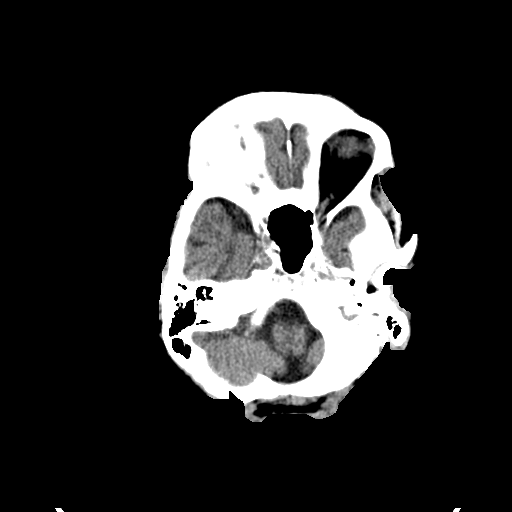
[im 9/30  brain]
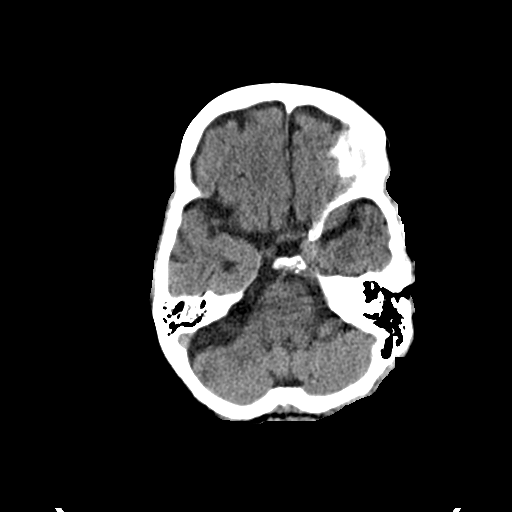
[im 11/30  brain]
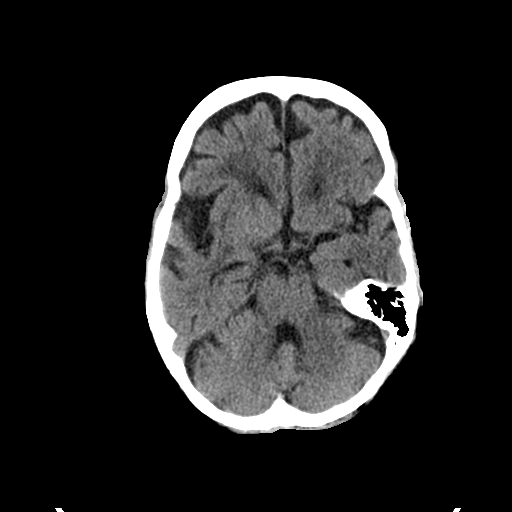
[im 11/30  bone]
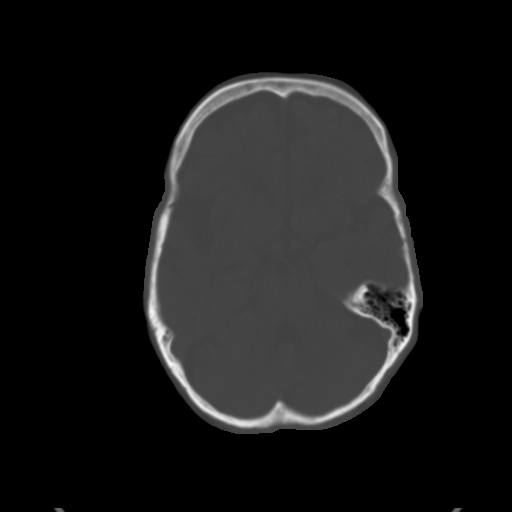
[im 13/30  brain]
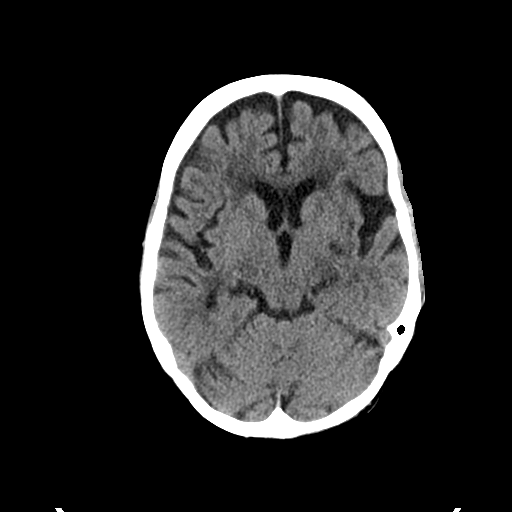
[im 15/30  brain]
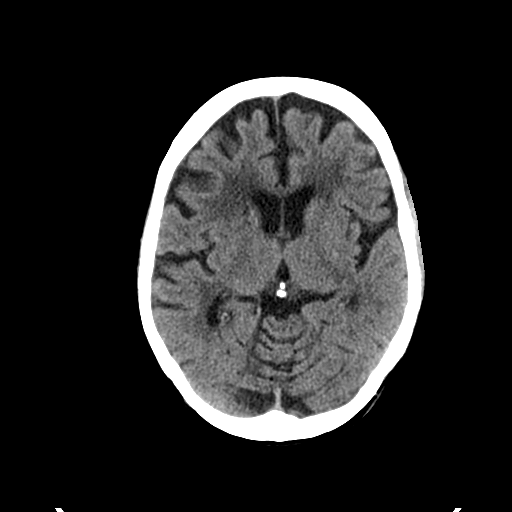
[im 17/30  brain]
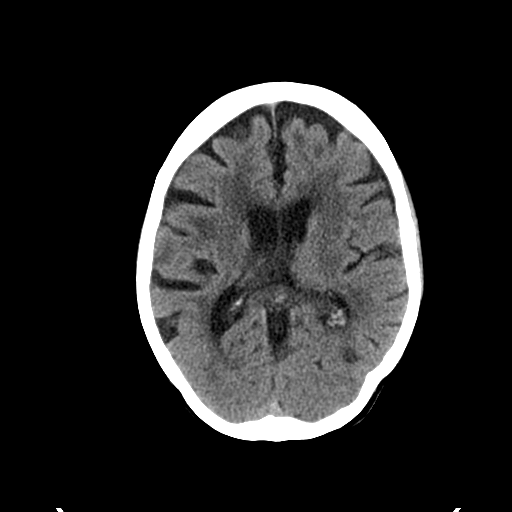
[im 19/30  brain]
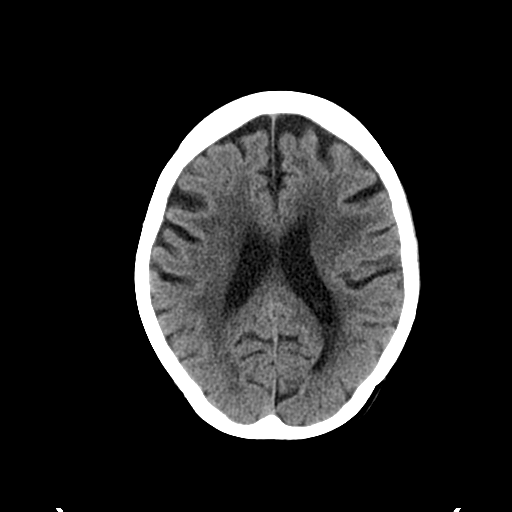
[im 19/30  bone]
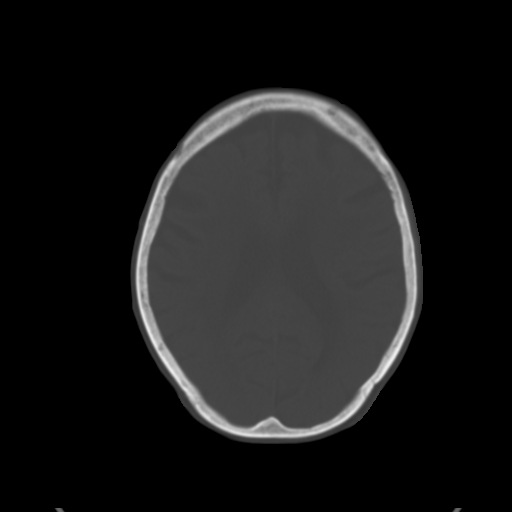
[im 21/30  brain]
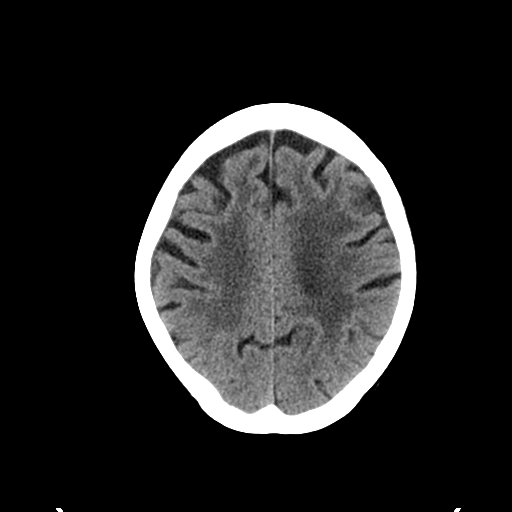
[im 23/30  brain]
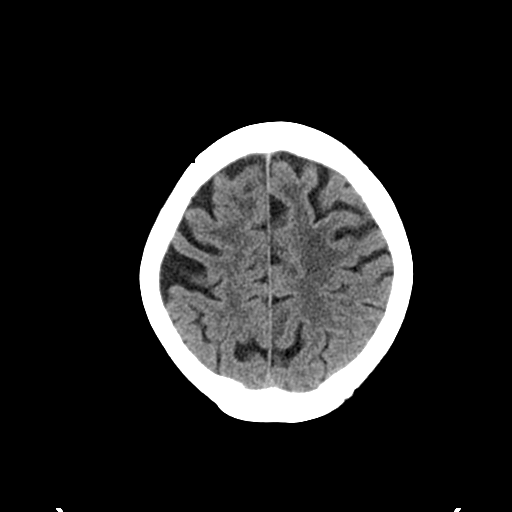
[im 25/30  brain]
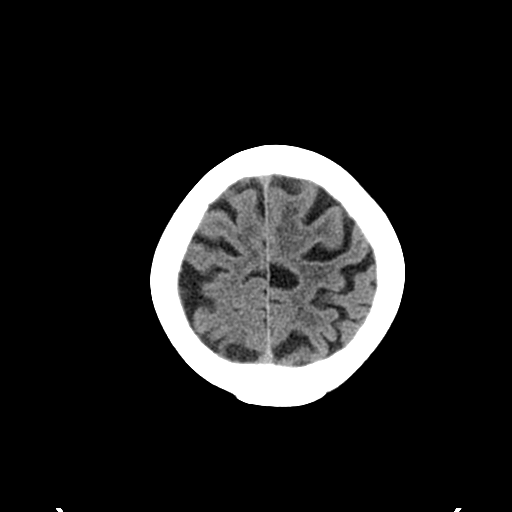
[im 27/30  brain]
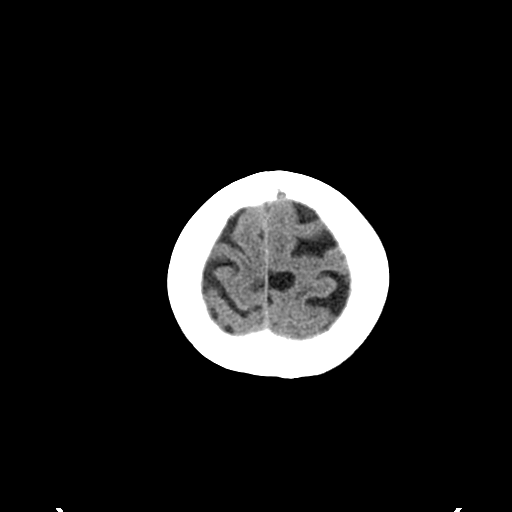
[im 27/30  bone]
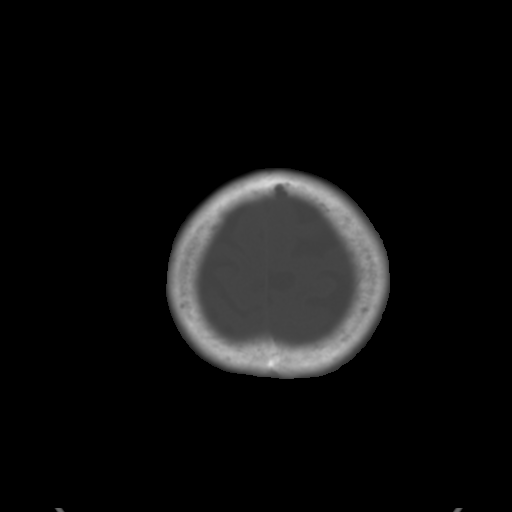

[Series 3: bone windows · axial · 0.48mm/px · z∈[+1546,+1586]mm · 3 of 30 slices shown]
[im 3/30  bone]
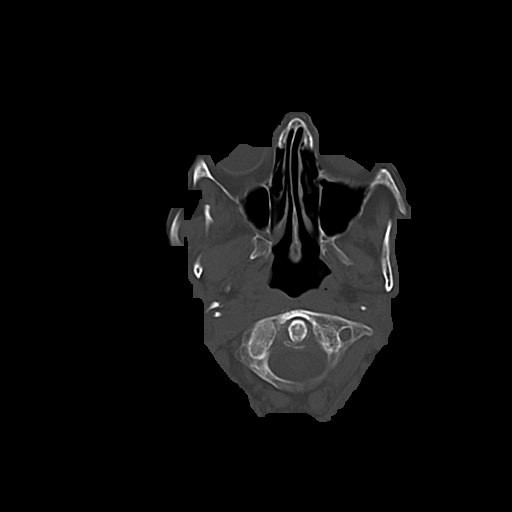
[im 7/30  bone]
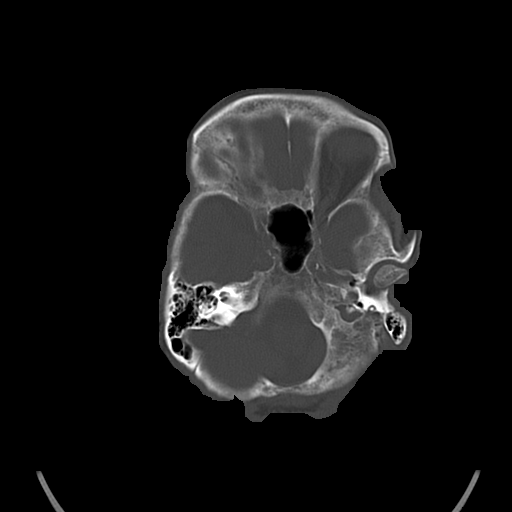
[im 11/30  bone]
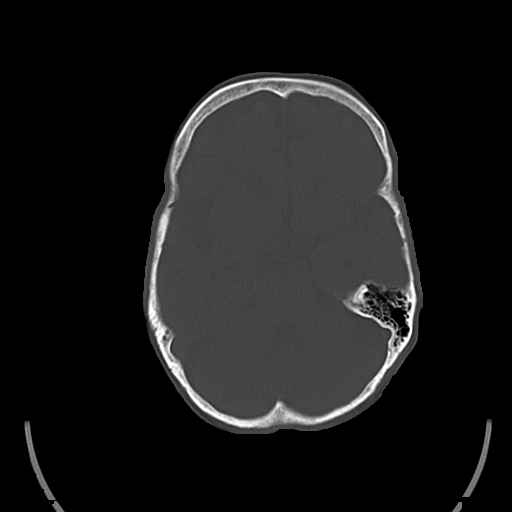

[16 of 30 positions shown; findings below may reference images not displayed]

FINDINGS: Negative for acute intracranial hemorrhage, acute infarction, mass,
mass effect, hydrocephalus or midline shift. Gray-white
differentiation is preserved throughout. Stable global cerebral
volume loss. Unchanged moderate periventricular and deep white
matter hypoattenuation most consistent with longstanding
microvascular ischemia. Lacunar infarct versus dilated perivascular
space in the left basal ganglia is unchanged. No acute soft tissue
or calvarial abnormalities.
IMPRESSION: No acute intracranial abnormality.

Stable atrophy and chronic microvascular ischemic white matter
changes.

## 2014-10-14 IMAGING — CR DG CHEST 1V PORT
1 series · 1 of 1 positions shown · non-contrast
Comparison: CT, 09/15/2013. Chest radiograph, 09/14/2013.

CLINICAL DATA: Followup pneumonia

EXAM:
PORTABLE CHEST - 1 VIEW

[AP]
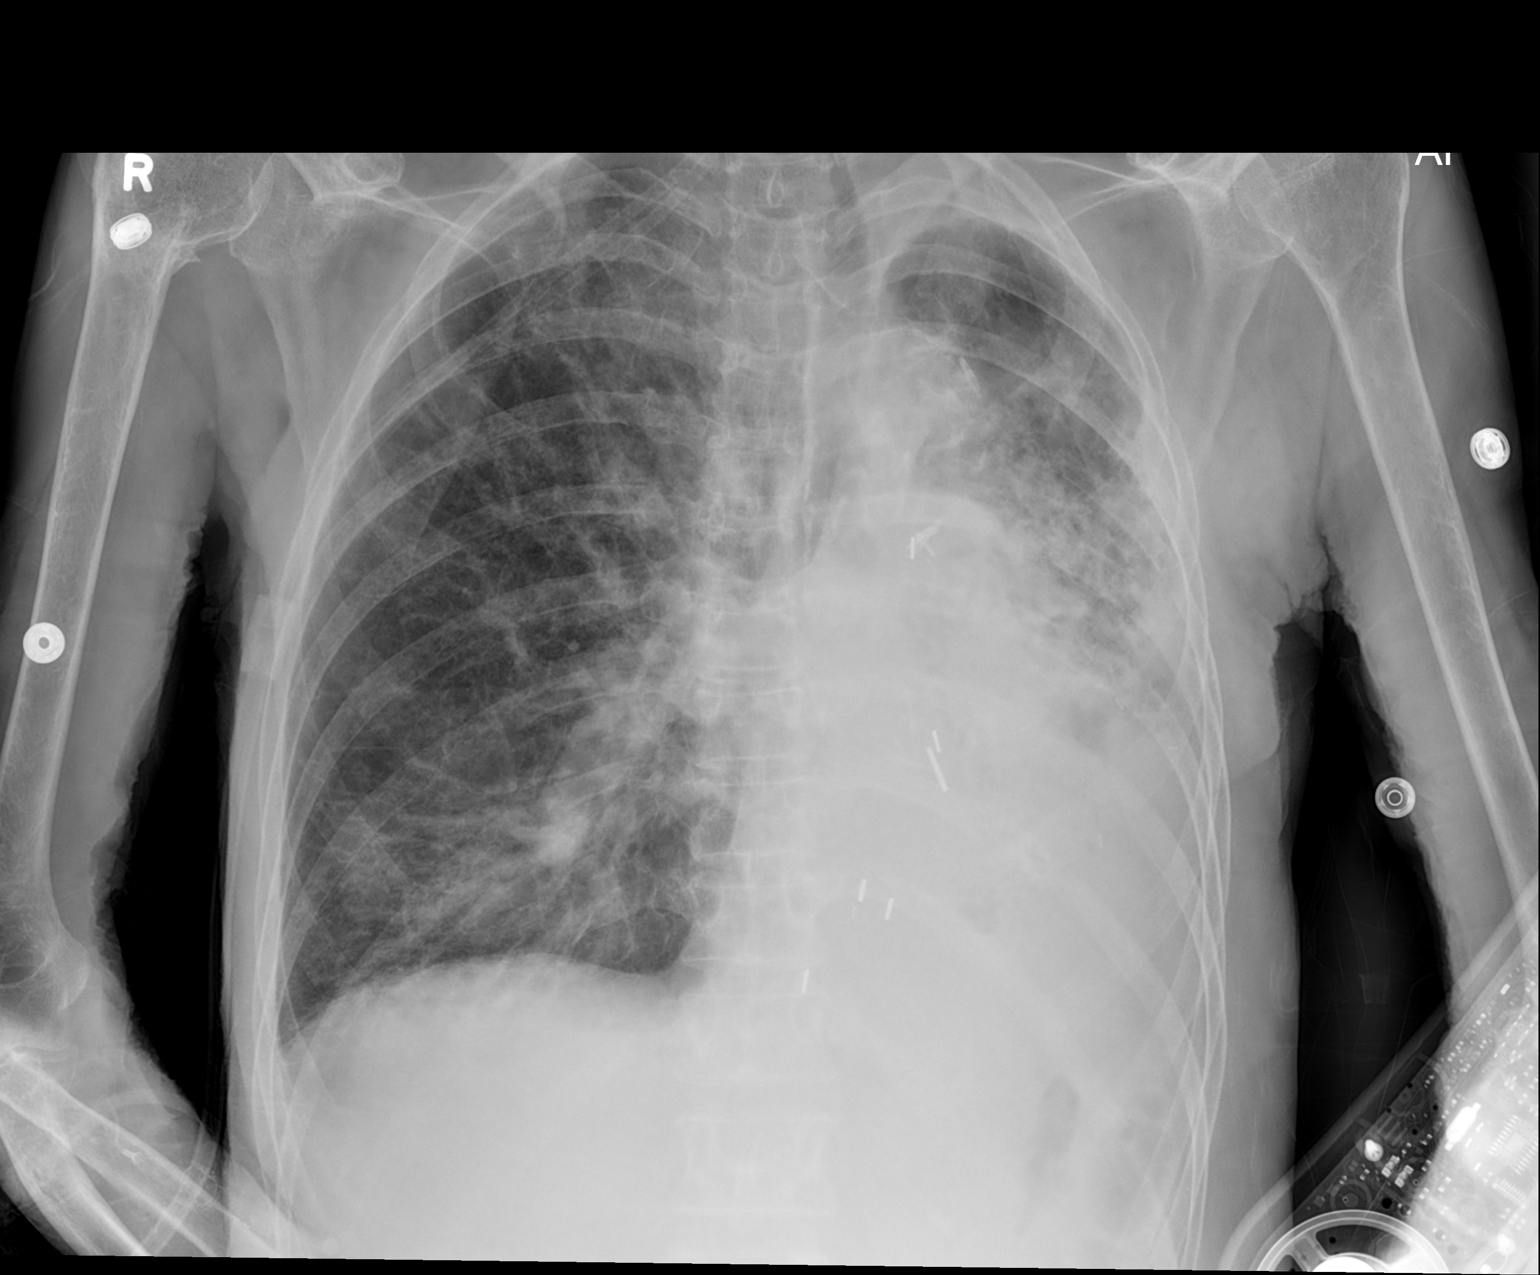

[1 of 1 positions shown; findings below may reference images not displayed]

FINDINGS: Consolidation in the remaining left lung has mildly increased most
evident in the upper lung zone. There is dense consolidation at the
base obscuring the heart border and hemidiaphragm.

The right lung is hyperexpanded. There are irregularly thickened
interstitial markings with some hazy basilar airspace opacity, which
may reflect a new area of pneumonia.

No pneumothorax. Changes from left lung surgery are stable.
IMPRESSION: 1. Mild increase in left lung consolidation when compared to the
prior chest radiograph.
2. Mild hazy airspace opacity at the right lung base may reflect a
new area of pneumonia or be due to atelectasis.

## 2014-10-16 IMAGING — CR DG CHEST 1V PORT
1 series · 1 of 1 positions shown · non-contrast
Comparison: Portable exam 0109 hr compared to earlier exam of
09/19/2013 at 2531 hr

CLINICAL DATA: Post bronchoscopic brushings of the left upper lobe

EXAM:
PORTABLE CHEST - 1 VIEW

[AP]
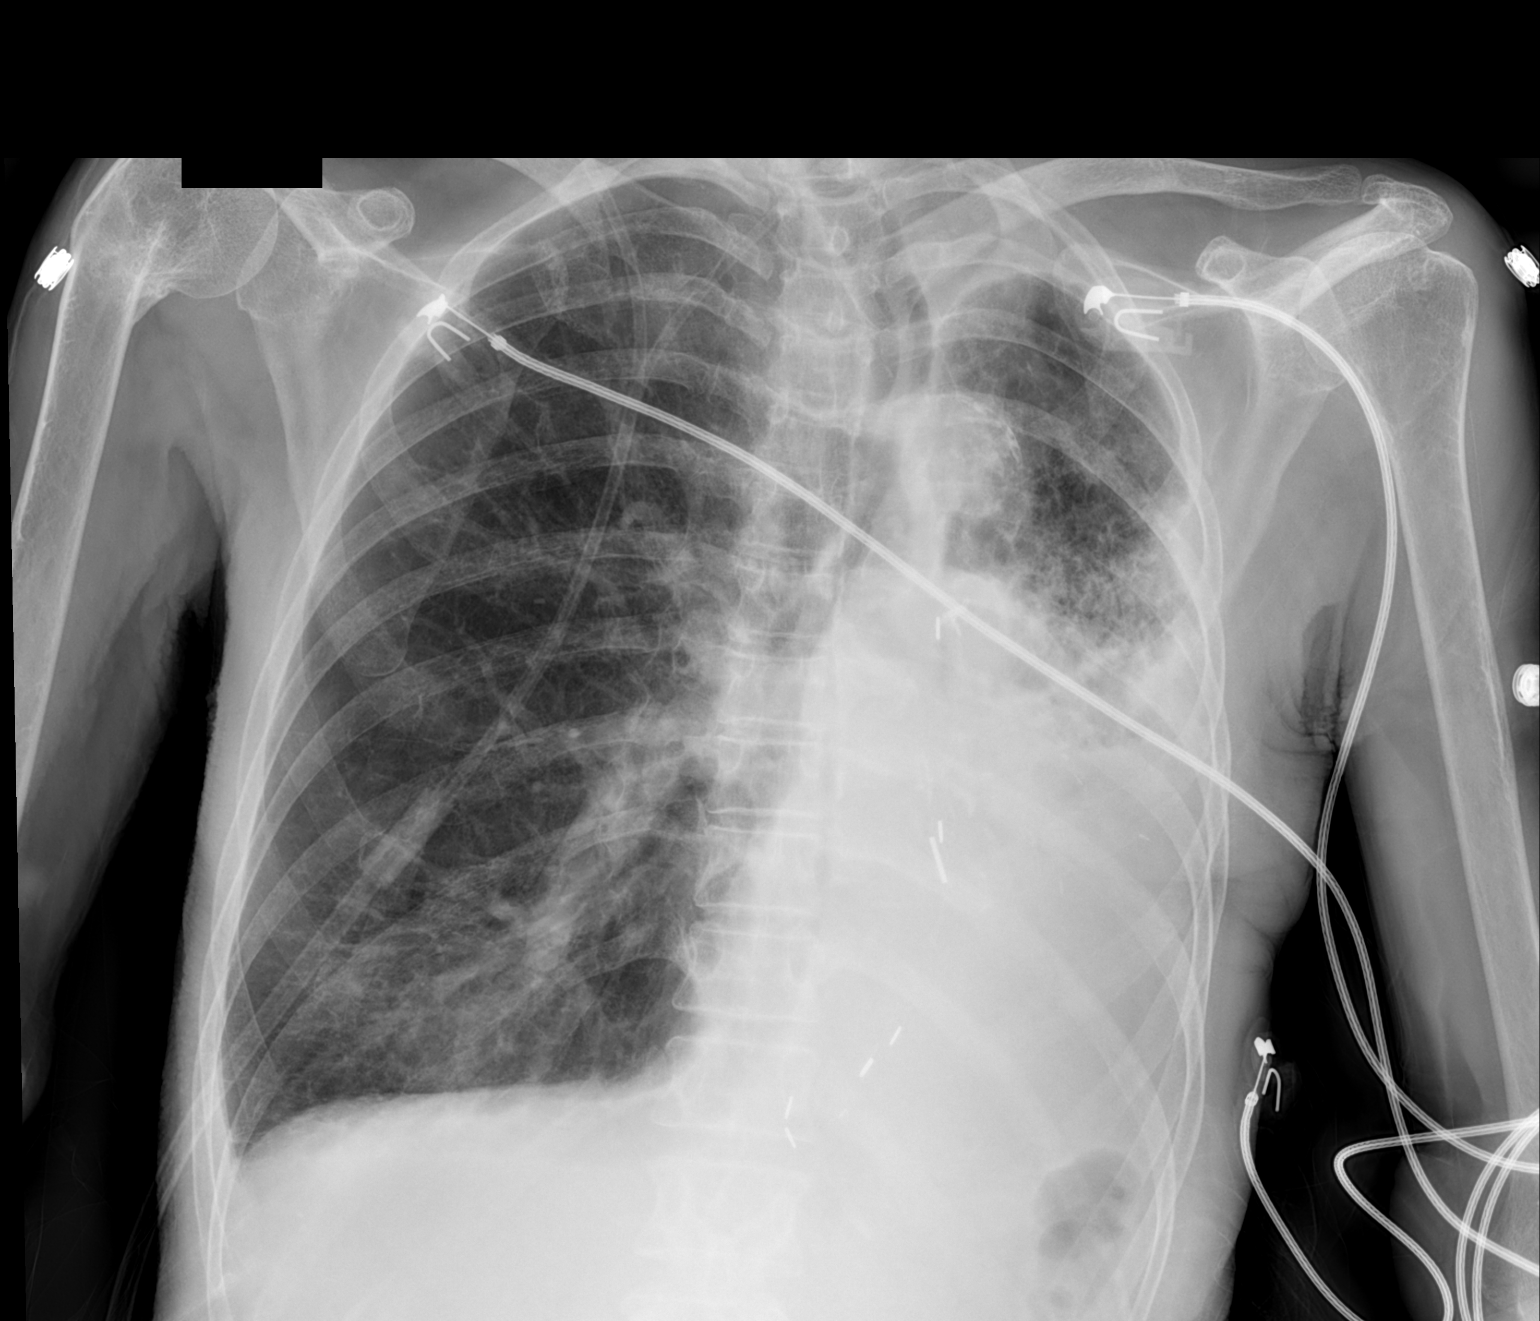

[1 of 1 positions shown; findings below may reference images not displayed]

FINDINGS: Volume loss in left hemi thorax with mediastinal shift to the left.

Persistent opacification of the mid to inferior left hemothorax at
left apex with infiltrate throughout left upper lobe as well.

Hyperexpanded right lung with underlying COPD.

Minimal atelectasis or infiltrate at right base, new.

No gross pneumothorax or new areas of consolidation identified.

Bones demineralized with pole posttraumatic deformity of the
proximal right humerus.
IMPRESSION: COPD changes with unchanged appearance of the left lung and slightly
increased opacity at the right base which could represent
atelectasis or infiltrate.

## 2015-06-04 ENCOUNTER — Other Ambulatory Visit: Payer: Self-pay
